# Patient Record
Sex: Male | Born: 1945 | Race: White | Hispanic: No | Smoking: Never smoker
Health system: Southern US, Community
[De-identification: ages and names within clinical notes are randomized; demographics above are authoritative.]

## PROBLEM LIST (undated history)

## (undated) DIAGNOSIS — C439 Malignant melanoma of skin, unspecified: Secondary | ICD-10-CM

## (undated) DIAGNOSIS — M75100 Unspecified rotator cuff tear or rupture of unspecified shoulder, not specified as traumatic: Secondary | ICD-10-CM

## (undated) DIAGNOSIS — I6529 Occlusion and stenosis of unspecified carotid artery: Secondary | ICD-10-CM

## (undated) DIAGNOSIS — I714 Abdominal aortic aneurysm, without rupture, unspecified: Secondary | ICD-10-CM

## (undated) DIAGNOSIS — R52 Pain, unspecified: Secondary | ICD-10-CM

## (undated) DIAGNOSIS — I10 Essential (primary) hypertension: Secondary | ICD-10-CM

## (undated) DIAGNOSIS — E119 Type 2 diabetes mellitus without complications: Secondary | ICD-10-CM

## (undated) DIAGNOSIS — I351 Nonrheumatic aortic (valve) insufficiency: Secondary | ICD-10-CM

## (undated) DIAGNOSIS — R432 Parageusia: Secondary | ICD-10-CM

## (undated) DIAGNOSIS — M2560 Stiffness of unspecified joint, not elsewhere classified: Secondary | ICD-10-CM

## (undated) DIAGNOSIS — R0602 Shortness of breath: Secondary | ICD-10-CM

## (undated) DIAGNOSIS — I219 Acute myocardial infarction, unspecified: Secondary | ICD-10-CM

## (undated) DIAGNOSIS — I499 Cardiac arrhythmia, unspecified: Secondary | ICD-10-CM

## (undated) DIAGNOSIS — G709 Myoneural disorder, unspecified: Secondary | ICD-10-CM

## (undated) DIAGNOSIS — M1990 Unspecified osteoarthritis, unspecified site: Secondary | ICD-10-CM

## (undated) DIAGNOSIS — Z9889 Other specified postprocedural states: Secondary | ICD-10-CM

## (undated) DIAGNOSIS — I2510 Atherosclerotic heart disease of native coronary artery without angina pectoris: Secondary | ICD-10-CM

## (undated) DIAGNOSIS — J449 Chronic obstructive pulmonary disease, unspecified: Secondary | ICD-10-CM

## (undated) DIAGNOSIS — D649 Anemia, unspecified: Secondary | ICD-10-CM

## (undated) DIAGNOSIS — F419 Anxiety disorder, unspecified: Secondary | ICD-10-CM

## (undated) DIAGNOSIS — C801 Malignant (primary) neoplasm, unspecified: Secondary | ICD-10-CM

## (undated) DIAGNOSIS — I509 Heart failure, unspecified: Secondary | ICD-10-CM

## (undated) DIAGNOSIS — F329 Major depressive disorder, single episode, unspecified: Secondary | ICD-10-CM

## (undated) DIAGNOSIS — Z9289 Personal history of other medical treatment: Secondary | ICD-10-CM

## (undated) DIAGNOSIS — F32A Depression, unspecified: Secondary | ICD-10-CM

## (undated) HISTORY — DX: Chronic obstructive pulmonary disease, unspecified: J44.9

## (undated) HISTORY — DX: Personal history of other medical treatment: Z92.89

## (undated) HISTORY — DX: Unspecified rotator cuff tear or rupture of unspecified shoulder, not specified as traumatic: M75.100

## (undated) HISTORY — DX: Heart failure, unspecified: I50.9

## (undated) HISTORY — DX: Malignant (primary) neoplasm, unspecified: C80.1

## (undated) HISTORY — DX: Parageusia: R43.2

## (undated) HISTORY — DX: Stiffness of unspecified joint, not elsewhere classified: M25.60

## (undated) HISTORY — DX: Atherosclerotic heart disease of native coronary artery without angina pectoris: I25.10

## (undated) HISTORY — PX: OTHER SURGICAL HISTORY: SHX169

## (undated) HISTORY — DX: Nonrheumatic aortic (valve) insufficiency: I35.1

## (undated) HISTORY — DX: Abdominal aortic aneurysm, without rupture, unspecified: I71.40

## (undated) HISTORY — PX: APPLICATION OF WOUND VAC: SHX5189

## (undated) HISTORY — DX: Acute myocardial infarction, unspecified: I21.9

## (undated) HISTORY — PX: MELANOMA EXCISION: SHX5266

## (undated) HISTORY — DX: Pain, unspecified: R52

## (undated) HISTORY — DX: Malignant melanoma of skin, unspecified: C43.9

## (undated) HISTORY — PX: ARTERIAL BYPASS SURGRY: SHX557

## (undated) HISTORY — DX: Abdominal aortic aneurysm, without rupture: I71.4

## (undated) HISTORY — PX: ABDOMINAL AORTIC ANEURYSM REPAIR: SUR1152

## (undated) HISTORY — DX: Occlusion and stenosis of unspecified carotid artery: I65.29

## (undated) HISTORY — DX: Essential (primary) hypertension: I10

## (undated) HISTORY — DX: Cardiac arrhythmia, unspecified: I49.9

## (undated) HISTORY — DX: Type 2 diabetes mellitus without complications: E11.9

## (undated) HISTORY — PX: PROSTATE BIOPSY: SHX241

---
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
## 1999-07-03 ENCOUNTER — Ambulatory Visit (HOSPITAL_COMMUNITY): Admission: RE | Admit: 1999-07-03 | Discharge: 1999-07-03 | Payer: Self-pay | Admitting: Internal Medicine

## 1999-07-03 ENCOUNTER — Encounter: Payer: Self-pay | Admitting: Internal Medicine

## 2005-10-26 ENCOUNTER — Emergency Department (HOSPITAL_COMMUNITY): Admission: EM | Admit: 2005-10-26 | Discharge: 2005-10-26 | Payer: Self-pay | Admitting: Emergency Medicine

## 2006-06-17 DIAGNOSIS — I509 Heart failure, unspecified: Secondary | ICD-10-CM

## 2006-06-17 DIAGNOSIS — I219 Acute myocardial infarction, unspecified: Secondary | ICD-10-CM

## 2006-06-17 HISTORY — DX: Acute myocardial infarction, unspecified: I21.9

## 2006-06-17 HISTORY — DX: Heart failure, unspecified: I50.9

## 2006-07-18 HISTORY — PX: CORONARY ARTERY BYPASS GRAFT: SHX141

## 2006-07-27 ENCOUNTER — Inpatient Hospital Stay (HOSPITAL_COMMUNITY): Admission: EM | Admit: 2006-07-27 | Discharge: 2006-08-07 | Payer: Self-pay | Admitting: Emergency Medicine

## 2006-07-28 ENCOUNTER — Ambulatory Visit: Payer: Self-pay | Admitting: Cardiothoracic Surgery

## 2006-07-28 ENCOUNTER — Ambulatory Visit: Payer: Self-pay | Admitting: Critical Care Medicine

## 2006-07-29 ENCOUNTER — Encounter (INDEPENDENT_AMBULATORY_CARE_PROVIDER_SITE_OTHER): Payer: Self-pay | Admitting: *Deleted

## 2006-07-29 ENCOUNTER — Encounter (INDEPENDENT_AMBULATORY_CARE_PROVIDER_SITE_OTHER): Payer: Self-pay | Admitting: Cardiology

## 2006-08-01 ENCOUNTER — Encounter: Payer: Self-pay | Admitting: Cardiothoracic Surgery

## 2006-08-22 ENCOUNTER — Ambulatory Visit: Payer: Self-pay | Admitting: Cardiothoracic Surgery

## 2006-08-22 ENCOUNTER — Encounter: Admission: RE | Admit: 2006-08-22 | Discharge: 2006-08-22 | Payer: Self-pay | Admitting: Cardiothoracic Surgery

## 2006-09-03 ENCOUNTER — Ambulatory Visit: Payer: Self-pay | Admitting: Critical Care Medicine

## 2006-09-12 ENCOUNTER — Ambulatory Visit: Payer: Self-pay | Admitting: Cardiothoracic Surgery

## 2006-09-22 ENCOUNTER — Ambulatory Visit: Payer: Self-pay | Admitting: Cardiothoracic Surgery

## 2006-12-11 ENCOUNTER — Encounter (HOSPITAL_COMMUNITY): Admission: RE | Admit: 2006-12-11 | Discharge: 2007-03-11 | Payer: Self-pay | Admitting: *Deleted

## 2007-03-06 ENCOUNTER — Ambulatory Visit: Payer: Self-pay | Admitting: Critical Care Medicine

## 2007-03-08 DIAGNOSIS — J8410 Pulmonary fibrosis, unspecified: Secondary | ICD-10-CM

## 2007-03-10 ENCOUNTER — Ambulatory Visit: Payer: Self-pay | Admitting: Cardiology

## 2007-03-12 ENCOUNTER — Encounter (HOSPITAL_COMMUNITY): Admission: RE | Admit: 2007-03-12 | Discharge: 2007-03-20 | Payer: Self-pay | Admitting: *Deleted

## 2007-03-27 ENCOUNTER — Ambulatory Visit: Payer: Self-pay | Admitting: Critical Care Medicine

## 2007-04-09 ENCOUNTER — Ambulatory Visit: Payer: Self-pay | Admitting: Critical Care Medicine

## 2007-09-16 ENCOUNTER — Encounter (INDEPENDENT_AMBULATORY_CARE_PROVIDER_SITE_OTHER): Payer: Self-pay | Admitting: Nurse Practitioner

## 2007-09-16 ENCOUNTER — Ambulatory Visit: Payer: Self-pay | Admitting: Family Medicine

## 2007-09-16 LAB — CONVERTED CEMR LAB
ALT: 16 units/L (ref 0–53)
Basophils Absolute: 0 10*3/uL (ref 0.0–0.1)
CO2: 25 meq/L (ref 19–32)
Calcium: 9.4 mg/dL (ref 8.4–10.5)
Chloride: 106 meq/L (ref 96–112)
Cholesterol: 133 mg/dL (ref 0–200)
Creatinine, Ser: 1.15 mg/dL (ref 0.40–1.50)
Hemoglobin: 15 g/dL (ref 13.0–17.0)
Lymphocytes Relative: 33 % (ref 12–46)
Microalb, Ur: 0.89 mg/dL (ref 0.00–1.89)
Monocytes Absolute: 0.6 10*3/uL (ref 0.1–1.0)
Neutro Abs: 3.8 10*3/uL (ref 1.7–7.7)
PSA: 2.61 ng/mL (ref 0.10–4.00)
RBC: 4.62 M/uL (ref 4.22–5.81)
RDW: 12.7 % (ref 11.5–15.5)
Total CHOL/HDL Ratio: 4.6
Total Protein: 8.1 g/dL (ref 6.0–8.3)
WBC: 6.9 10*3/uL (ref 4.0–10.5)

## 2007-09-18 ENCOUNTER — Ambulatory Visit: Payer: Self-pay | Admitting: *Deleted

## 2007-10-09 ENCOUNTER — Ambulatory Visit (HOSPITAL_COMMUNITY): Admission: RE | Admit: 2007-10-09 | Discharge: 2007-10-09 | Payer: Self-pay | Admitting: Family Medicine

## 2007-10-13 ENCOUNTER — Ambulatory Visit: Payer: Self-pay | Admitting: Internal Medicine

## 2008-03-30 ENCOUNTER — Encounter (INDEPENDENT_AMBULATORY_CARE_PROVIDER_SITE_OTHER): Payer: Self-pay | Admitting: Internal Medicine

## 2008-03-30 ENCOUNTER — Ambulatory Visit: Payer: Self-pay | Admitting: Internal Medicine

## 2008-03-30 LAB — CONVERTED CEMR LAB
ALT: 13 units/L (ref 0–53)
Alkaline Phosphatase: 46 units/L (ref 39–117)
LDL Cholesterol: 76 mg/dL (ref 0–99)
Sodium: 141 meq/L (ref 135–145)
Total Bilirubin: 0.8 mg/dL (ref 0.3–1.2)
Total Protein: 7.8 g/dL (ref 6.0–8.3)
VLDL: 25 mg/dL (ref 0–40)

## 2008-04-01 ENCOUNTER — Ambulatory Visit (HOSPITAL_COMMUNITY): Admission: RE | Admit: 2008-04-01 | Discharge: 2008-04-01 | Payer: Self-pay | Admitting: Internal Medicine

## 2008-07-22 ENCOUNTER — Ambulatory Visit: Payer: Self-pay | Admitting: Family Medicine

## 2008-07-22 LAB — CONVERTED CEMR LAB
HDL: 31 mg/dL — ABNORMAL LOW (ref >39)
Triglycerides: 123 mg/dL (ref <150)

## 2008-07-29 ENCOUNTER — Ambulatory Visit: Payer: Self-pay

## 2008-08-01 ENCOUNTER — Ambulatory Visit: Payer: Self-pay | Admitting: Cardiology

## 2008-08-16 ENCOUNTER — Encounter: Payer: Self-pay | Admitting: Cardiology

## 2008-08-16 ENCOUNTER — Ambulatory Visit: Payer: Self-pay

## 2008-08-16 ENCOUNTER — Ambulatory Visit: Payer: Self-pay | Admitting: Cardiology

## 2008-08-16 LAB — CONVERTED CEMR LAB
CO2: 30 meq/L (ref 19–32)
Calcium: 9 mg/dL (ref 8.4–10.5)
Creatinine, Ser: 1 mg/dL (ref 0.4–1.5)
Glucose, Bld: 113 mg/dL — ABNORMAL HIGH (ref 70–99)
Sodium: 141 meq/L (ref 135–145)

## 2008-08-23 ENCOUNTER — Encounter: Payer: Self-pay | Admitting: Cardiology

## 2008-08-23 ENCOUNTER — Ambulatory Visit: Payer: Self-pay | Admitting: Cardiology

## 2008-08-23 DIAGNOSIS — E785 Hyperlipidemia, unspecified: Secondary | ICD-10-CM | POA: Insufficient documentation

## 2008-08-23 DIAGNOSIS — I10 Essential (primary) hypertension: Secondary | ICD-10-CM | POA: Insufficient documentation

## 2008-08-23 DIAGNOSIS — R55 Syncope and collapse: Secondary | ICD-10-CM

## 2008-08-23 DIAGNOSIS — R0602 Shortness of breath: Secondary | ICD-10-CM | POA: Insufficient documentation

## 2008-08-23 DIAGNOSIS — I714 Abdominal aortic aneurysm, without rupture: Secondary | ICD-10-CM

## 2008-09-19 ENCOUNTER — Encounter (INDEPENDENT_AMBULATORY_CARE_PROVIDER_SITE_OTHER): Payer: Self-pay | Admitting: Internal Medicine

## 2008-09-19 ENCOUNTER — Ambulatory Visit: Payer: Self-pay | Admitting: Internal Medicine

## 2008-09-19 LAB — CONVERTED CEMR LAB
Albumin: 4.3 g/dL (ref 3.5–5.2)
Alkaline Phosphatase: 43 units/L (ref 39–117)
BUN: 19 mg/dL (ref 6–23)
CO2: 23 meq/L (ref 19–32)
Cholesterol: 116 mg/dL (ref 0–200)
Glucose, Bld: 95 mg/dL (ref 70–99)
HDL: 30 mg/dL — ABNORMAL LOW (ref >39)
LDL Cholesterol: 62 mg/dL (ref 0–99)
Potassium: 3.6 meq/L (ref 3.5–5.3)
Triglycerides: 120 mg/dL (ref <150)

## 2008-10-12 ENCOUNTER — Encounter (INDEPENDENT_AMBULATORY_CARE_PROVIDER_SITE_OTHER): Payer: Self-pay | Admitting: General Surgery

## 2008-10-12 ENCOUNTER — Ambulatory Visit: Payer: Self-pay | Admitting: Cardiology

## 2008-10-12 ENCOUNTER — Ambulatory Visit: Payer: Self-pay

## 2008-10-12 LAB — CONVERTED CEMR LAB
ALT: 14 units/L (ref 0–53)
Bilirubin, Direct: 0.1 mg/dL (ref 0.0–0.3)
LDL Cholesterol: 66 mg/dL (ref 0–99)
Total Bilirubin: 1 mg/dL (ref 0.3–1.2)
Total CHOL/HDL Ratio: 5
VLDL: 24.8 mg/dL (ref 0.0–40.0)

## 2008-10-14 ENCOUNTER — Telehealth: Payer: Self-pay | Admitting: Cardiology

## 2008-11-23 ENCOUNTER — Encounter: Payer: Self-pay | Admitting: Cardiology

## 2008-11-23 ENCOUNTER — Ambulatory Visit: Payer: Self-pay | Admitting: Internal Medicine

## 2008-12-22 ENCOUNTER — Ambulatory Visit: Payer: Self-pay | Admitting: Internal Medicine

## 2009-02-01 ENCOUNTER — Encounter (INDEPENDENT_AMBULATORY_CARE_PROVIDER_SITE_OTHER): Payer: Self-pay | Admitting: Internal Medicine

## 2009-02-01 ENCOUNTER — Ambulatory Visit: Payer: Self-pay | Admitting: Family Medicine

## 2009-02-01 LAB — CONVERTED CEMR LAB
Albumin: 4.2 g/dL (ref 3.5–5.2)
Alkaline Phosphatase: 38 units/L — ABNORMAL LOW (ref 39–117)
BUN: 20 mg/dL (ref 6–23)
CO2: 24 meq/L (ref 19–32)
Calcium: 8.8 mg/dL (ref 8.4–10.5)
Chloride: 107 meq/L (ref 96–112)
Cholesterol: 113 mg/dL (ref 0–200)
Creatinine, Ser: 1.12 mg/dL (ref 0.40–1.50)
Glucose, Bld: 116 mg/dL — ABNORMAL HIGH (ref 70–99)
HDL: 29 mg/dL — ABNORMAL LOW (ref >39)
LDL Cholesterol: 61 mg/dL (ref 0–99)
Potassium: 3.8 meq/L (ref 3.5–5.3)
Sodium: 142 meq/L (ref 135–145)
Total Protein: 7.5 g/dL (ref 6.0–8.3)
Triglycerides: 113 mg/dL (ref <150)

## 2009-02-16 ENCOUNTER — Ambulatory Visit: Payer: Self-pay | Admitting: Cardiology

## 2009-02-21 ENCOUNTER — Ambulatory Visit: Payer: Self-pay | Admitting: Family Medicine

## 2009-02-22 ENCOUNTER — Telehealth: Payer: Self-pay | Admitting: Cardiology

## 2009-03-23 ENCOUNTER — Ambulatory Visit: Payer: Self-pay | Admitting: Internal Medicine

## 2009-03-27 ENCOUNTER — Telehealth: Payer: Self-pay | Admitting: Cardiology

## 2009-03-30 ENCOUNTER — Ambulatory Visit: Payer: Self-pay | Admitting: Family Medicine

## 2009-05-18 ENCOUNTER — Ambulatory Visit: Payer: Self-pay | Admitting: Cardiology

## 2009-05-22 LAB — CONVERTED CEMR LAB
ALT: 26 units/L (ref 0–53)
AST: 24 units/L (ref 0–37)
Albumin: 3.8 g/dL (ref 3.5–5.2)
Alkaline Phosphatase: 35 units/L — ABNORMAL LOW (ref 39–117)
Cholesterol: 125 mg/dL (ref 0–200)
LDL Cholesterol: 80 mg/dL (ref 0–99)
VLDL: 18 mg/dL (ref 0.0–40.0)

## 2009-07-13 ENCOUNTER — Encounter (INDEPENDENT_AMBULATORY_CARE_PROVIDER_SITE_OTHER): Payer: Self-pay | Admitting: *Deleted

## 2009-07-17 ENCOUNTER — Ambulatory Visit: Payer: Self-pay | Admitting: Internal Medicine

## 2009-07-17 LAB — CONVERTED CEMR LAB: Microalb, Ur: 0.55 mg/dL (ref 0.00–1.89)

## 2009-07-24 ENCOUNTER — Ambulatory Visit: Payer: Self-pay | Admitting: Cardiology

## 2009-07-28 ENCOUNTER — Telehealth: Payer: Self-pay | Admitting: Cardiology

## 2009-07-28 LAB — CONVERTED CEMR LAB
ALT: 23 units/L (ref 0–53)
Albumin: 3.8 g/dL (ref 3.5–5.2)
Alkaline Phosphatase: 37 units/L — ABNORMAL LOW (ref 39–117)
Bilirubin, Direct: 0 mg/dL (ref 0.0–0.3)
Cholesterol: 105 mg/dL (ref 0–200)
LDL Cholesterol: 55 mg/dL (ref 0–99)
Total Protein: 7.9 g/dL (ref 6.0–8.3)
Triglycerides: 90 mg/dL (ref 0.0–149.0)
VLDL: 18 mg/dL (ref 0.0–40.0)

## 2009-08-24 ENCOUNTER — Ambulatory Visit: Payer: Self-pay | Admitting: Cardiology

## 2009-08-24 DIAGNOSIS — I739 Peripheral vascular disease, unspecified: Secondary | ICD-10-CM

## 2009-09-11 ENCOUNTER — Ambulatory Visit: Payer: Self-pay

## 2009-09-11 ENCOUNTER — Ambulatory Visit: Payer: Self-pay | Admitting: Internal Medicine

## 2009-09-11 ENCOUNTER — Encounter: Payer: Self-pay | Admitting: Cardiology

## 2009-09-11 ENCOUNTER — Ambulatory Visit (HOSPITAL_COMMUNITY): Admission: RE | Admit: 2009-09-11 | Discharge: 2009-09-11 | Payer: Self-pay | Admitting: Cardiology

## 2009-09-20 ENCOUNTER — Ambulatory Visit: Payer: Self-pay | Admitting: Internal Medicine

## 2009-11-07 ENCOUNTER — Ambulatory Visit: Payer: Self-pay | Admitting: Internal Medicine

## 2010-01-17 ENCOUNTER — Telehealth: Payer: Self-pay | Admitting: Cardiology

## 2010-01-22 ENCOUNTER — Telehealth: Payer: Self-pay | Admitting: Cardiology

## 2010-03-20 ENCOUNTER — Encounter: Payer: Self-pay | Admitting: Cardiology

## 2010-04-16 ENCOUNTER — Ambulatory Visit: Payer: Self-pay | Admitting: Cardiology

## 2010-04-16 ENCOUNTER — Encounter: Payer: Self-pay | Admitting: Cardiology

## 2010-04-17 LAB — CONVERTED CEMR LAB
AST: 20 units/L (ref 0–37)
Alkaline Phosphatase: 38 units/L — ABNORMAL LOW (ref 39–117)
Bilirubin, Direct: 0.1 mg/dL (ref 0.0–0.3)
Calcium: 8.9 mg/dL (ref 8.4–10.5)
GFR calc non Af Amer: 78.79 mL/min (ref >60)
HDL: 26.5 mg/dL — ABNORMAL LOW (ref >39.00)
LDL Cholesterol: 55 mg/dL (ref 0–99)
Potassium: 3.9 meq/L (ref 3.5–5.1)
Sodium: 141 meq/L (ref 135–145)
Total Bilirubin: 0.5 mg/dL (ref 0.3–1.2)
Total CHOL/HDL Ratio: 4
VLDL: 30.6 mg/dL (ref 0.0–40.0)

## 2010-04-25 ENCOUNTER — Telehealth (INDEPENDENT_AMBULATORY_CARE_PROVIDER_SITE_OTHER): Payer: Self-pay | Admitting: *Deleted

## 2010-04-26 ENCOUNTER — Telehealth: Payer: Self-pay | Admitting: Cardiology

## 2010-05-01 ENCOUNTER — Telehealth: Payer: Self-pay | Admitting: Cardiology

## 2010-07-08 ENCOUNTER — Encounter: Payer: Self-pay | Admitting: Cardiothoracic Surgery

## 2010-07-17 NOTE — Progress Notes (Signed)
Summary: WOULD LIKE RESULTS FROM LAB WORK DONE MONDAY  Phone Note Call from Patient Call back at Home Phone [phone]   Reason for Call: Talk to Nurse, Talk to Doctor, Lab or Test Results Summary of Call: PT HAD LAB WORK DONE MONDAY AND HE WOULD LIKE THE RESULTS Initial call taken by: Omer Jack,  July 28, 2009 12:39 PM  Follow-up for Phone Call        PT AWARE OF LAB RESULTS. Follow-up by: Scherrie Bateman, LPN,  July 28, 2009 2:34 PM

## 2010-07-17 NOTE — Progress Notes (Signed)
  Walk in Patient Form Recieved " Pt left Info about Prescription" sent to Message Nurse"  Mitchell [name]  April 25, 2010 8:58 AM

## 2010-07-17 NOTE — Letter (Signed)
Summary: Appointment - Reminder 2  [facility], Main Office  [address]   [place], [place] [address]   Phone: [phone]  Fax: [phone]     July 13, 2009 MRN: [id number]   Mitchell [name] [address] [place], [place]  [address]   Dear Mr. SHON,  Our records indicate that it is time to schedule a follow-up appointment with Dr. Shirlee Latch. It is very important that we reach you to schedule this appointment. We look forward to participating in your health care needs. Please contact us at the number listed above at your earliest convenience to schedule your appointment.  If you are unable to make an appointment at this time, give [place] a call so we can update our records.  Sincerely,   Migdalia Dk [facility] Scheduling Team

## 2010-07-17 NOTE — Progress Notes (Signed)
Summary: refill request  Phone Note Refill Request Message from:  Patient on January 17, 2010 2:41 PM  Refills Requested: Medication #1:  PLAVIX 75 MG TABS Take one tablet by mouth daily  Medication #2:  TEKTURNA 150 MG  TABS one by mouth once daily [facility]   Method Requested: Telephone to Pharmacy Initial call taken by: Glynda Jaeger,  January 17, 2010 2:42 PM  Follow-up for Phone Call       Follow-up by: Judithe Modest CMA,  January 17, 2010 3:54 PM    Prescriptions: PLAVIX 75 MG TABS (CLOPIDOGREL BISULFATE) Take one tablet by mouth daily  #30 x 6   Entered by:   Judithe Modest CMA   Authorized by:   Marca Ancona, MD   Signed by:   Judithe Modest CMA on 01/17/2010   Method used:   Faxed to ...       [facility] (retail)       [address] [place], [place]  [address]       Ph: [phone]       Fax: [phone]   RxID:   [id number] TEKTURNA 150 MG  TABS (ALISKIREN FUMARATE) one by mouth once daily  #30 x 6   Entered by:   Judithe Modest CMA   Authorized by:   Marca Ancona, MD   Signed by:   Judithe Modest CMA on 01/17/2010   Method used:   Faxed to ...       [facility] (retail)       [address] [place], [place]  [address]       Ph: [phone]       Fax: [phone]   RxID:   [id number]

## 2010-07-17 NOTE — Assessment & Plan Note (Signed)
Summary: 6 month rov/sl   Primary Provider:  Dow Adolph  CC:  6 month ROV.  History of Present Illness: 65 yo with h/o CAD s/p CABG, aortic aneurysm, HTN, DM2 returns to cardiology clinic.  Pt has been doing well.  BP is 136/74  today.  He walks daily for about 2 miles.  He is a little short of breath by the time he reaches the end and he is short of breath with inclines.  No chest pain.  He has some very mild myalgias that may be related to his statin but he says that they are tolerable.  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  He is not planning on having surgery.   ECG:  NSR, PACs  Labs (8/10): K 3.8, creatinine 1.12, LDL 61, HDL 29 Labs (3/11): HDL 32, LDL 55, LFTs normal  Current Medications (verified): 1)  Coreg 12.5 Mg Tabs (Carvedilol) .... Take 1 1/2 By Mouth Two Times A Day 2)  Aspirin 81 Mg Tbec (Aspirin) .... Take One Tablet By Mouth Daily 3)  Glipizide Xl 5 Mg  Tb24 (Glipizide) .... 1/2 Tab Two Times A Day 4)  Doxazosin Mesylate 4 Mg  Tabs (Doxazosin Mesylate) .... One By Mouth Once Daily 5)  Tekturna 150 Mg  Tabs (Aliskiren Fumarate) .... One By Mouth Once Daily 6)  Amlodipine Besylate 10 Mg Tabs (Amlodipine Besylate) .... Take One Tablet By Mouth Daily 7)  K-Lor 20 Meq Pack (Potassium Chloride) .... Take 1 Once Daily 8)  Crestor 20 Mg Tabs (Rosuvastatin Calcium) .... One Tablet Daily 9)  Hydrochlorothiazide 25 Mg Tabs (Hydrochlorothiazide) .... Take One Tablet By Mouth Daily. 10)  Benazepril Hcl 40 Mg Tabs (Benazepril Hcl) .... Take 1 Once Daily 11)  Trazodone Hcl 100 Mg Tabs (Trazodone Hcl) .... Take 1 1/2 At Bedtime 12)  Plavix 75 Mg Tabs (Clopidogrel Bisulfate) .... Take One Tablet By Mouth Daily 13)  Spiriva Handihaler 18 Mcg  Caps (Tiotropium Bromide Monohydrate) .... Contents of One Capsule Inhaled Daily 14)  Multivitamins   Tabs (Multiple Vitamin) .... Once Daily 15)  Fish Oil 1000 Mg Caps (Omega-3 Fatty Acids) .... Take 1  Tablet By Mouth Two Times A Day 16)  Coenzyme Q10 100 Mg Caps (Coenzyme Q10) .Marland Kitchen.. 1 Daily 17)  Salsalate 500 Mg Tabs (Salsalate) .... Take One Tablet Three Times A Day 18)  Viagra 50 Mg Tabs (Sildenafil Citrate) .... Take As Needed  Allergies (verified): No Known Drug Allergies  Past History:  Past Medical History: 1. Coronary artery disease.  The patient had non-ST-elevation MI in February 2008 and there was an 80-90% proximal LAD stenosis, 90%proximal first diagonal stenosis, 99% ostial ramus stenosis.  The first obtuse marginal was subtotally occluded and there was an 80% mid RCA stenosis.  EF was estimated 25% on ventriculogram, coronary artery bypass grafting was done with a LIMA to the LAD, sequential saphenous vein graft to the PDA, and PLOM, saphenous graft to the diagonal, saphenous vein graft to the ramus. 2. Most recent echo (3/10): EF 55%, mild inferoseptal HK, mild LVH, pseudonormal diastolic function, mild AI, mild MR, mild to moderate LAE, PASP 36, mild to moderate TR.  3. Infrarenal AAA.  The last ultrasound was done in 4/10 showed AAA 3.7 cm.  4. Hypertension. 5. Type 2 diabetes. 6. History of torn left rotator cuff. 7. History of obstructive/restrictive PFTs.  There is some reversibility with bronchodilator.  The patient actually never smoked.  He is on Spiriva. 8. Holter (3/10):  Frequent PACs and blocked PACs.  PVCs with trigeminy.  No long pauses.  Slowest HR in 40s while asleep.  8. Carotid stenosis.  The patient had mild bilateral internal carotid stenosis of 40-59% on last carotid Dopplers in February 2010.   Family History: Reviewed history from 02/16/2009 and no changes required. Both the patient's mother and father had abdominal aortic aneurysm and both died due to complications with attempted AAA repair.  Social History: Reviewed history from 02/16/2009 and no changes required. The patient is a lifetime nonsmoker. He is unemployed but draws a pension from the  [employer].  He lives by himself.  He has no close family left, he only has cousins.  He plays the piano in church.   Review of Systems       All systems reviewed and negative except as per HPI.   Vital Signs:  Patient profile:   65 year old male Height:      72 inches Weight:      216 pounds BMI:     29.40 Pulse rate:   57 / minute Pulse rhythm:   regular BP sitting:   136 / 74  (left arm) Cuff size:   regular  Vitals Entered By: Judithe Modest CMA (August 24, 2009 3:20 PM)  Physical Exam  General:  Well developed, well nourished, in no acute distress. Neck:  Neck supple, no JVD. No masses, thyromegaly or abnormal cervical nodes. Lungs:  Clear bilaterally to auscultation. Heart:  Non-displaced PMI, chest non-tender; regular rate and rhythm, S1, S2 without murmurs, rubs. +S4. Carotid upstroke normal, no bruit. Pedals normal pulses. No edema, no varicosities. Abdomen:  Bowel sounds positive; abdomen soft and non-tender without masses, organomegaly, or hernias noted. No hepatosplenomegaly. Extremities:  No clubbing or cyanosis. Neurologic:  Alert and oriented x 3. Psych:  Normal affect.   Impression & Recommendations:  Problem # 1:  CORONARY ARTERY DISEASE (ICD-414.00) Stable.  No chest pain.  Continue current meds including ASA, statin, ACEI, Coreg, Plavix.  Will get an echocardiogram to reassess LV systolic function (he says he was told that he has CHF by the [employer] and does not understand why, he does not think they did an echo).    Problem # 2:  AAA (ICD-441.4) Patient is due for abdominal US to follow AAA.   Problem # 3:  CAROTID ARTERY STENOSIS (ICD-433.10) Patient is due to carotid dopplers to follow carotid stenosis.    Problem # 4:  HYPERLIPIDEMIA-MIXED (ICD-272.4) Excellent LDL.  HDL is low.  Patient had bad headaches with Niaspan in the past and is not able to tolerate it.  Very mild myalgias on Crestor.  Tolerable per patient, will continue current dose.    Other  Orders: EKG w/ Interpretation (93000) Carotid Duplex (Carotid Duplex) Echocardiogram (Echo) Abdominal Aorta Duplex (Abd Aorta Duplex)  Patient Instructions: 1)  Your physician recommends that you schedule a follow-up appointment in: 6 months with Dr. Shirlee Latch. 2)  Your physician has requested that you have an abdominal aorta duplex. During this test, an ultrasound is used to evaluate the aorta. Allow 30 minutes for this exam. Do not eat after midnight the day before and avoid carbonated beverages. There are no restrictions or special instructions. 3)  Your physician has requested that you have a carotid duplex. This test is an ultrasound of the carotid arteries in your neck. It looks at blood flow through these arteries that supply the brain with blood. Allow  one hour for this exam. There are no restrictions or special instructions. 4)  Your physician has requested that you have an echocardiogram.  Echocardiography is a painless test that uses sound waves to create images of your heart. It provides your doctor with information about the size and shape of your heart and how well your heart's chambers and valves are working.  This procedure takes approximately one hour. There are no restrictions for this procedure. 5)  Your physician recommends that you continue on your current medications as directed. Please refer to the Current Medication list given to you today. 6)  Also, you will need to have a FASTING lipid and liver blood test prior to your appointment in 6 months with Dr. Shirlee Latch.

## 2010-07-17 NOTE — Progress Notes (Signed)
Summary: re benicar   Phone Note Call from Patient Call back at Home Phone [phone]   Caller: Patient Reason for Call: Talk to Nurse Summary of Call: pt is not taking benicar this month. pt will start meds next month because pharmacy fill his old rx tekurna. Initial call taken by: Roe Coombs,  May 01, 2010 3:15 PM  Follow-up for Phone Call        pt states that he was able to get Tekturna and he did not have to change to Benicar--he will call me if he does have to make the change from [place] to [employer] due insurance company request--if he does have to change to Benicar he will need BMP 2 weeks after changing to Benicar and followup on B/P after changing medication    New/Updated Medications: TEKTURNA 150 MG TABS (ALISKIREN FUMARATE) one daily   Current Medications (verified): 1)  Coreg 12.5 Mg Tabs (Carvedilol) .... Take 1 1/2 By Mouth Two Times A Day 2)  Aspirin 81 Mg Tbec (Aspirin) .... Take One Tablet By Mouth Daily 3)  Glipizide Xl 5 Mg  Tb24 (Glipizide) .... 1/2 Tab Two Times A Day 4)  Doxazosin Mesylate 4 Mg  Tabs (Doxazosin Mesylate) .... One By Mouth Once Daily 5)  Tekturna 150 Mg Tabs (Aliskiren Fumarate) .... One Daily 6)  Amlodipine Besylate 10 Mg Tabs (Amlodipine Besylate) .... Take One Tablet By Mouth Daily 7)  K-Lor 20 Meq Pack (Potassium Chloride) .... Take 1 Once Daily 8)  Crestor 20 Mg Tabs (Rosuvastatin Calcium) .... One Tablet Daily 9)  Hydrochlorothiazide 25 Mg Tabs (Hydrochlorothiazide) .... Take One Tablet By Mouth Daily. 10)  Benazepril Hcl 40 Mg Tabs (Benazepril Hcl) .... Take 1 Once Daily 11)  Trazodone Hcl 100 Mg Tabs (Trazodone Hcl) .... Take 1 1/2 At Bedtime 12)  Plavix 75 Mg Tabs (Clopidogrel Bisulfate) .... Take One Tablet By Mouth Daily 13)  Spiriva Handihaler 18 Mcg  Caps (Tiotropium Bromide Monohydrate) .... Contents of One Capsule Inhaled Daily 14)  Multivitamins   Tabs (Multiple Vitamin) .... Once Daily 15)  Fish Oil 1000 Mg Caps  (Omega-3 Fatty Acids) .... Take 1 Tablet By Mouth Two Times A Day 16)  Coenzyme Q10 100 Mg Caps (Coenzyme Q10) .Marland Kitchen.. 1 Daily 17)  Salsalate 500 Mg Tabs (Salsalate) .... Take One Tablet Three Times A Day 18)  Viagra 50 Mg Tabs (Sildenafil Citrate) .... Take As Needed  Allergies: No Known Drug Allergies

## 2010-07-17 NOTE — Progress Notes (Signed)
Summary: refill wrong pharmacy  Phone Note Refill Request Message from:  Patient on January 22, 2010 2:19 PM  Refills Requested: Medication #1:  TEKTURNA 150 MG  TABS one by mouth once daily  Medication #2:  PLAVIX 75 MG TABS Take one tablet by mouth daily refills were suppose to go to [facility] we resend?   Follow-up for Phone Call       Follow-up by: Judithe Modest CMA,  January 22, 2010 2:45 PM    Prescriptions: PLAVIX 75 MG TABS (CLOPIDOGREL BISULFATE) Take one tablet by mouth daily  #30 x 6   Entered by:   Judithe Modest CMA   Authorized by:   Marca Ancona, MD   Signed by:   Judithe Modest CMA on 01/22/2010   Method used:   Electronically to        [facility] (retail)       [address]       [place], [place]  [address]       Ph: [phone]       Fax: [phone]   RxID:   [id number] TEKTURNA 150 MG  TABS (ALISKIREN FUMARATE) one by mouth once daily  #30 x 6   Entered by:   Judithe Modest CMA   Authorized by:   Marca Ancona, MD   Signed by:   Judithe Modest CMA on 01/22/2010   Method used:   Electronically to        [facility] (retail)       [address]       [place], [place]  [address]       Ph: [phone]       Fax: [phone]   RxID:   [id number]

## 2010-07-17 NOTE — Progress Notes (Signed)
Summary: insurance requesting change Tekturna  ---- Converted from flag ---- ---- 04/24/2010 4:13 PM, Judithe Modest CMA wrote: Elmon Else, this pt insurance requesting a change from Tekturna to either Losartan, Benicar or Diovan. Would Dr. Shirlee Latch authorize changing this?  Marchelle Folks ------------------------------  Appended Document: insurance requesting change Tekturna olmesartan 10 mg daily to replace Tekturna.  Check BMET in 2 wks, call to make sure BP is ok in a couple of weeks as well.   Appended Document: insurance requesting change Tekturna discussed with pt--he will return for BMP 05/11/10 --he will take and record his B/P --I will followup with him in about 2 weeks to get his B/P readings   Clinical Lists Changes  Medications: Changed medication from TEKTURNA 150 MG  TABS (ALISKIREN FUMARATE) one by mouth once daily to BENICAR 20 MG TABS (OLMESARTAN MEDOXOMIL) one-half daily - Signed Rx of BENICAR 20 MG TABS (OLMESARTAN MEDOXOMIL) one-half daily;  #15 x 11;  Signed;  Entered by: Katina Dung, RN, BSN;  Authorized by: Marca Ancona, MD;  Method used: Electronically to [facility] [address], [place], [place], [place]  [address], Ph: [phone], Fax: [phone] Observations: Added new observation of MEDRECON: current updated (04/26/2010 11:29)    Prescriptions: BENICAR 20 MG TABS (OLMESARTAN MEDOXOMIL) one-half daily  #15 x 11   Entered by:   Katina Dung, RN, BSN   Authorized by:   Marca Ancona, MD   Signed by:   Katina Dung, RN, BSN on 04/26/2010   Method used:   Electronically to        [facility] (retail)       [address]       [place], [place]  [address]       Ph: [phone]       Fax: [phone]   RxID:   [id number]     Current Medications (verified): 1)  Coreg 12.5 Mg Tabs (Carvedilol) .... Take 1 1/2 By Mouth Two Times A Day 2)  Aspirin 81 Mg Tbec (Aspirin) .... Take One Tablet By Mouth  Daily 3)  Glipizide Xl 5 Mg  Tb24 (Glipizide) .... 1/2 Tab Two Times A Day 4)  Doxazosin Mesylate 4 Mg  Tabs (Doxazosin Mesylate) .... One By Mouth Once Daily 5)  Benicar 20 Mg Tabs (Olmesartan Medoxomil) .... One-Half Daily 6)  Amlodipine Besylate 10 Mg Tabs (Amlodipine Besylate) .... Take One Tablet By Mouth Daily 7)  K-Lor 20 Meq Pack (Potassium Chloride) .... Take 1 Once Daily 8)  Crestor 20 Mg Tabs (Rosuvastatin Calcium) .... One Tablet Daily 9)  Hydrochlorothiazide 25 Mg Tabs (Hydrochlorothiazide) .... Take One Tablet By Mouth Daily. 10)  Benazepril Hcl 40 Mg Tabs (Benazepril Hcl) .... Take 1 Once Daily 11)  Trazodone Hcl 100 Mg Tabs (Trazodone Hcl) .... Take 1 1/2 At Bedtime 12)  Plavix 75 Mg Tabs (Clopidogrel Bisulfate) .... Take One Tablet By Mouth Daily 13)  Spiriva Handihaler 18 Mcg  Caps (Tiotropium Bromide Monohydrate) .... Contents of One Capsule Inhaled Daily 14)  Multivitamins   Tabs (Multiple Vitamin) .... Once Daily 15)  Fish Oil 1000 Mg Caps (Omega-3 Fatty Acids) .... Take 1 Tablet By Mouth Two Times A Day 16)  Coenzyme Q10 100 Mg Caps (Coenzyme Q10) .Marland Kitchen.. 1 Daily 17)  Salsalate 500 Mg Tabs (Salsalate) .... Take One Tablet Three Times A Day 18)  Viagra 50 Mg Tabs (Sildenafil Citrate) .... Take As Needed  Allergies: No Known  Drug Allergies

## 2010-07-17 NOTE — Assessment & Plan Note (Signed)
Summary: 6 month ck/mt   History of Present Illness: 64 yo with h/o CAD s/p CABG, aortic aneurysm, HTN, DM2 returns to cardiology clinic.  Pt has been doing well.  BP is 108/80  today and has been under good control at home.  He walks daily for at least a mile.  He is a little short of breath by the time he reaches the end and he is short of breath with inclines.  No chest pain.  Echo in 3/11 showed moderate aortic insufficiency and carotids showed moderate bilateral disease.   ECG:  NSR, PACs at 54  Labs (8/10): K 3.8, creatinine 1.12, LDL 61, HDL 29 Labs (3/11): HDL 32, LDL 55, LFTs normal  Current Medications (verified): 1)  Coreg 12.5 Mg Tabs (Carvedilol) .... Take 1 1/2 By Mouth Two Times A Day 2)  Aspirin 81 Mg Tbec (Aspirin) .... Take One Tablet By Mouth Daily 3)  Glipizide Xl 5 Mg  Tb24 (Glipizide) .... 1/2 Tab Two Times A Day 4)  Doxazosin Mesylate 4 Mg  Tabs (Doxazosin Mesylate) .... One By Mouth Once Daily 5)  Tekturna 150 Mg  Tabs (Aliskiren Fumarate) .... One By Mouth Once Daily 6)  Amlodipine Besylate 10 Mg Tabs (Amlodipine Besylate) .... Take One Tablet By Mouth Daily 7)  K-Lor 20 Meq Pack (Potassium Chloride) .... Take 1 Once Daily 8)  Crestor 20 Mg Tabs (Rosuvastatin Calcium) .... One Tablet Daily 9)  Hydrochlorothiazide 25 Mg Tabs (Hydrochlorothiazide) .... Take One Tablet By Mouth Daily. 10)  Benazepril Hcl 40 Mg Tabs (Benazepril Hcl) .... Take 1 Once Daily 11)  Trazodone Hcl 100 Mg Tabs (Trazodone Hcl) .... Take 1 1/2 At Bedtime 12)  Plavix 75 Mg Tabs (Clopidogrel Bisulfate) .... Take One Tablet By Mouth Daily 13)  Spiriva Handihaler 18 Mcg  Caps (Tiotropium Bromide Monohydrate) .... Contents of One Capsule Inhaled Daily 14)  Multivitamins   Tabs (Multiple Vitamin) .... Once Daily 15)  Fish Oil 1000 Mg Caps (Omega-3 Fatty Acids) .... Take 1 Tablet By Mouth Two Times A Day 16)  Coenzyme Q10 100 Mg Caps (Coenzyme Q10) .Marland Kitchen.. 1 Daily 17)  Salsalate 500 Mg Tabs (Salsalate)  .... Take One Tablet Three Times A Day 18)  Viagra 50 Mg Tabs (Sildenafil Citrate) .... Take As Needed  Allergies (verified): No Known Drug Allergies  Past History:  Past Medical History: 1. Coronary artery disease.  The patient had non-ST-elevation MI in February 2008 and there was an 80-90% proximal LAD stenosis, 90%proximal first diagonal stenosis, 99% ostial ramus stenosis.  The first obtuse marginal was subtotally occluded and there was an 80% mid RCA stenosis.  EF was estimated 25% on ventriculogram, coronary artery bypass grafting was done with a LIMA to the LAD, sequential saphenous vein graft to the PDA, and PLOM, saphenous graft to the diagonal, saphenous vein graft to the ramus. 2. Aortic insufficiency: Most recent echo (3/11): EF 55-60%, moderate diastolic dysfunction, moderate aortic insufficiency, moderate LAE.  3. Infrarenal AAA.  The last ultrasound was done in 3/11 and showed AAA 3.9 x 3.9 cm. 4. Hypertension. 5. Type 2 diabetes. 6. History of torn left rotator cuff. 7. History of obstructive/restrictive PFTs.  There is some reversibility with bronchodilator.  The patient actually never smoked.  He is on Spiriva. 8. Holter (3/10):  Frequent PACs and blocked PACs.  PVCs with trigeminy.  No long pauses.  Slowest HR in 40s while asleep.  8. Carotid stenosis.  The patient had mild-moderate bilateral internal carotid stenosis of  40-59% on last carotid Dopplers in 3/11.   Family History: Reviewed history from 02/16/2009 and no changes required. Both the patient's mother and father had abdominal aortic aneurysm and both died due to complications with attempted AAA repair.  Social History: Reviewed history from 08/24/2009 and no changes required. The patient is a lifetime nonsmoker. He is unemployed but draws a pension from the [place].  He lives by himself.  He has no close family left, he only has cousins.  He plays the piano in church.   Review of Systems       All systems  reviewed and negative except as per HPI.   Vital Signs:  Patient profile:   65 year old male Height:      72 inches Weight:      215 pounds BMI:     29.26 Pulse rate:   54 / minute Resp:     16 per minute BP sitting:   108 / 80  (right arm)  Vitals Entered By: Marrion Coy, CNA (April 16, 2010 9:24 AM)  Physical Exam  General:  Well developed, well nourished, in no acute distress. Neck:  Neck supple, no JVD. No masses, thyromegaly or abnormal cervical nodes. Lungs:  Clear bilaterally to auscultation. Heart:  Non-displaced PMI, chest non-tender; regular rate and rhythm, S1, S2 without murmurs, rubs. +S4. Carotid upstroke normal, no bruit. Pedals normal pulses. No edema, no varicosities. Abdomen:  Bowel sounds positive; abdomen soft and non-tender without masses, organomegaly, or hernias noted. No hepatosplenomegaly. Extremities:  No clubbing or cyanosis. Neurologic:  Alert and oriented x 3. Psych:  Normal affect.   Impression & Recommendations:  Problem # 1:  CORONARY ATHEROSLERO UNSPEC TYPE BYPASS GRAFT (ICD-414.05) Stable with no ischemic symptoms. Continue Coreg, ASA, statin, ACEI, Plavix.   Problem # 2:  AORTIC INSUFFICIENCY (ICD-424.1) Moderate AI on 3/11 echo.  Control BP and repeat echo in 3/12.   Problem # 3:  AAA (ICD-441.4) Repeat abdominal US in 3/12.   Problem # 4:  HYPERTENSION, UNSPECIFIED (ICD-401.9) BP is under good control on current regimen, continue.   Problem # 5:  HYPERLIPIDEMIA-MIXED (ICD-272.4) Goal LDL < 70, lipids/LFTs today.   Problem # 6:  CAROTID ARTERY STENOSIS (ICD-433.10) Repeat carotid dopplers 3/11.   Other Orders: Carotid Duplex (Carotid Duplex) Echocardiogram (Echo) Abdominal Aorta Duplex (Abd Aorta Duplex) TLB-BMP (Basic Metabolic Panel-BMET) (80048-METABOL)  Patient Instructions: 1)  Your physician recommends that you return for a FASTING lipid profile/liver profile/BMP today--414.05 424.1 2)  Your physician has requested  that you have an abdominal aorta duplex. During this test, an ultrasound is used to evaluate the aorta. Allow 30 minutes for this exam. Do not eat after midnight the day before and avoid carbonated beverages. There are no restrictions or special instructions. MARCH 2012 3)  Your physician has requested that you have a carotid duplex. This test is an ultrasound of the carotid arteries in your neck. It looks at blood flow through these arteries that supply the brain with blood. Allow one hour for this exam. There are no restrictions or special instructions. MARCH 2012 4)  Your physician has requested that you have an echocardiogram.  Echocardiography is a painless test that uses sound waves to create images of your heart. It provides your doctor with information about the size and shape of your heart and how well your heart's chambers and valves are working.  This procedure takes approximately one hour. There are no restrictions for this procedure. MARCH 2012 5)  Your physician wants you to follow-up in: 6 months with Dr Shirlee Latch.  You will receive a reminder letter in the mail two months in advance. If you don't receive a letter, please call our office to schedule the follow-up appointment.

## 2010-07-19 NOTE — Letter (Signed)
Summary: [employer] Medicare Rx Plans Prescription Drug Coverage Notice   [facility] Rx Plans Prescription Drug Coverage Notice   Imported By: Roderic Ovens 05/22/2010 11:55:37  _____________________________________________________________________  External Attachment:    Type:   Image     Comment:   External Document

## 2010-07-27 ENCOUNTER — Telehealth: Payer: Self-pay | Admitting: Cardiology

## 2010-08-08 NOTE — Progress Notes (Signed)
Summary: questions about having labs drawn  Phone Note Call from Patient Call back at Home Phone [phone]   Caller: Patient Summary of Call: questions about  having labs drawn Initial call taken by: Judie Grieve,  July 27, 2010 3:23 PM  Follow-up for Phone Call        Pt called today and scheduled appt for 6 month follow up with Dr. Shirlee Latch on September 11, 2010. He is asking if he needs lab work prior to this office visit.  I told pt I would forward to Dr. Alford Highland nurse Thurston Hole to review and she would call him back next week. Follow-up by: Dossie Arbour, RN, BSN,  July 27, 2010 3:37 PM     Appended Document: questions about having labs drawn Needs lipids/LFTs in 4/12.   Appended Document: questions about having labs drawn I talked with pt--scheduled for fasting L/L 09/18/10 prior to appt with Dr Shirlee Latch 09/24/10

## 2010-08-24 ENCOUNTER — Encounter: Payer: Self-pay | Admitting: Cardiology

## 2010-08-27 ENCOUNTER — Encounter: Payer: Self-pay | Admitting: Cardiology

## 2010-08-27 ENCOUNTER — Encounter (INDEPENDENT_AMBULATORY_CARE_PROVIDER_SITE_OTHER): Payer: Medicare Other

## 2010-08-27 DIAGNOSIS — I714 Abdominal aortic aneurysm, without rupture: Secondary | ICD-10-CM

## 2010-08-27 DIAGNOSIS — R0989 Other specified symptoms and signs involving the circulatory and respiratory systems: Secondary | ICD-10-CM

## 2010-08-27 DIAGNOSIS — I6529 Occlusion and stenosis of unspecified carotid artery: Secondary | ICD-10-CM

## 2010-08-28 NOTE — Miscellaneous (Signed)
Summary: Orders Update  Clinical Lists Changes  Orders: Added new Test order of Carotid Duplex (Carotid Duplex) - Signed Added new Test order of Abdominal Aorta Duplex (Abd Aorta Duplex) - Signed

## 2010-09-04 ENCOUNTER — Other Ambulatory Visit (HOSPITAL_COMMUNITY): Payer: Self-pay

## 2010-09-11 ENCOUNTER — Ambulatory Visit: Payer: Self-pay | Admitting: Cardiology

## 2010-09-11 ENCOUNTER — Encounter: Payer: Self-pay | Admitting: Cardiology

## 2010-09-12 ENCOUNTER — Ambulatory Visit: Payer: Self-pay | Admitting: Cardiology

## 2010-09-16 DIAGNOSIS — C439 Malignant melanoma of skin, unspecified: Secondary | ICD-10-CM

## 2010-09-16 DIAGNOSIS — Z85820 Personal history of malignant melanoma of skin: Secondary | ICD-10-CM | POA: Insufficient documentation

## 2010-09-16 HISTORY — DX: Malignant melanoma of skin, unspecified: C43.9

## 2010-09-17 ENCOUNTER — Other Ambulatory Visit (HOSPITAL_COMMUNITY): Payer: Self-pay | Admitting: Cardiology

## 2010-09-17 DIAGNOSIS — I351 Nonrheumatic aortic (valve) insufficiency: Secondary | ICD-10-CM

## 2010-09-18 ENCOUNTER — Other Ambulatory Visit (INDEPENDENT_AMBULATORY_CARE_PROVIDER_SITE_OTHER): Payer: Medicare Other | Admitting: *Deleted

## 2010-09-18 ENCOUNTER — Ambulatory Visit (HOSPITAL_COMMUNITY): Payer: Medicare Other | Attending: Cardiology | Admitting: Radiology

## 2010-09-18 ENCOUNTER — Other Ambulatory Visit: Payer: Self-pay

## 2010-09-18 DIAGNOSIS — I351 Nonrheumatic aortic (valve) insufficiency: Secondary | ICD-10-CM

## 2010-09-18 DIAGNOSIS — I25810 Atherosclerosis of coronary artery bypass graft(s) without angina pectoris: Secondary | ICD-10-CM

## 2010-09-18 DIAGNOSIS — I2589 Other forms of chronic ischemic heart disease: Secondary | ICD-10-CM

## 2010-09-18 DIAGNOSIS — I6529 Occlusion and stenosis of unspecified carotid artery: Secondary | ICD-10-CM | POA: Insufficient documentation

## 2010-09-18 DIAGNOSIS — I10 Essential (primary) hypertension: Secondary | ICD-10-CM

## 2010-09-18 DIAGNOSIS — I359 Nonrheumatic aortic valve disorder, unspecified: Secondary | ICD-10-CM

## 2010-09-18 DIAGNOSIS — E785 Hyperlipidemia, unspecified: Secondary | ICD-10-CM

## 2010-09-19 LAB — HEPATIC FUNCTION PANEL
ALT: 21 U/L (ref 0–53)
AST: 26 U/L (ref 0–37)
Alkaline Phosphatase: 39 U/L (ref 39–117)
Bilirubin, Direct: 0.1 mg/dL (ref 0.0–0.3)
Total Bilirubin: 0.6 mg/dL (ref 0.3–1.2)
Total Protein: 7.7 g/dL (ref 6.0–8.3)

## 2010-09-19 LAB — LIPID PANEL
Cholesterol: 97 mg/dL (ref 0–200)
HDL: 26.6 mg/dL — ABNORMAL LOW (ref >39.00)
LDL Cholesterol: 53 mg/dL (ref 0–99)
Total CHOL/HDL Ratio: 4
Triglycerides: 89 mg/dL (ref 0.0–149.0)
VLDL: 17.8 mg/dL (ref 0.0–40.0)

## 2010-09-24 ENCOUNTER — Ambulatory Visit: Payer: Self-pay | Admitting: Cardiology

## 2010-09-24 ENCOUNTER — Encounter: Payer: Self-pay | Admitting: Cardiology

## 2010-09-26 ENCOUNTER — Encounter: Payer: Self-pay | Admitting: Cardiology

## 2010-09-26 ENCOUNTER — Ambulatory Visit (INDEPENDENT_AMBULATORY_CARE_PROVIDER_SITE_OTHER): Payer: Medicare Other | Admitting: Cardiology

## 2010-09-26 VITALS — BP 119/70 | HR 57 | Ht 72.0 in | Wt 213.1 lb

## 2010-09-26 DIAGNOSIS — I2510 Atherosclerotic heart disease of native coronary artery without angina pectoris: Secondary | ICD-10-CM

## 2010-09-26 DIAGNOSIS — I25810 Atherosclerosis of coronary artery bypass graft(s) without angina pectoris: Secondary | ICD-10-CM

## 2010-09-26 DIAGNOSIS — I714 Abdominal aortic aneurysm, without rupture: Secondary | ICD-10-CM

## 2010-09-26 DIAGNOSIS — E785 Hyperlipidemia, unspecified: Secondary | ICD-10-CM

## 2010-09-26 DIAGNOSIS — I10 Essential (primary) hypertension: Secondary | ICD-10-CM

## 2010-09-26 DIAGNOSIS — I359 Nonrheumatic aortic valve disorder, unspecified: Secondary | ICD-10-CM

## 2010-09-26 DIAGNOSIS — I5032 Chronic diastolic (congestive) heart failure: Secondary | ICD-10-CM

## 2010-09-26 DIAGNOSIS — I6529 Occlusion and stenosis of unspecified carotid artery: Secondary | ICD-10-CM

## 2010-09-26 MED ORDER — SILDENAFIL CITRATE 50 MG PO TABS: ORAL_TABLET | ORAL | Status: DC

## 2010-09-26 NOTE — Patient Instructions (Addendum)
You can stop Doxazosin.  Call me if your blood pressure goes over 140 after you stop Doxazosin.--- Luana Shu   Your physician recommends that you return for a FASTING lipid profile/liver profile in 6 months. (October 2012)  414.05  401.9  Your physician wants you to follow-up in: 6 months with Dr Shirlee Latch. (October 2012).You will receive a reminder letter in the mail two months in advance. If you don't receive a letter, please call our office to schedule the follow-up appointment.

## 2010-09-26 NOTE — Assessment & Plan Note (Addendum)
Stable with no ischemic symptoms.  COntinue ASA, Plavix, statin, Coreg, ACEI.

## 2010-09-26 NOTE — Assessment & Plan Note (Signed)
Only mild on last echo.  May be improved due to better BP control.

## 2010-09-26 NOTE — Assessment & Plan Note (Signed)
Lipids have been well-controlled.  Will repeat in 6 months at the time of next appointment.  Goal LDL < 70.

## 2010-09-26 NOTE — Assessment & Plan Note (Signed)
BP is well-controlled on multiple agents.  He tells me that his urologist wants him to stop doxazosin.  I would also rather him not take this particular medication if it is not needed for urologic reasons.  He will stop doxazosin and follow his BP.  If BP runs high off doxazosin, would increase benazepril to 40 mg daily.

## 2010-09-26 NOTE — Assessment & Plan Note (Signed)
Stable chronic exertional dyspnea.  Not volume overloaded on exam.

## 2010-09-26 NOTE — Assessment & Plan Note (Signed)
Stable, repeat abdominal US in 3/13.

## 2010-09-26 NOTE — Progress Notes (Signed)
PCP: Dr. Jeannetta Nap  65 yo with h/o CAD s/p CABG, aortic aneurysm, HTN, DM2 returns to cardiology clinic.  Pt has been doing well.  Active in his church, plays the piano. BP is 119/70  today and has been under good control at home.  He walks daily for at least a mile.  He is a little short of breath by the time he reaches the end and he is short of breath with inclines.  Stable very atypical chest pain.  Echo in 4/12 with EF 50-55%, only mild aortic insufficiency (moderate in the past).  Stable moderate carotid stenosis and stable aortic aneurysm on recent carotid and abdominal ultrasounds.    ECG:  NSR, PACs  Labs (8/10): K 3.8, creatinine 1.12, LDL 61, HDL 29 Labs (3/11): HDL 32, LDL 55, LFTs normal Labs (98/11): LDL 55, HDL 27, K 3.9, creatinine 1.0 Labs (4/12): LDL 53, HDL 27  Allergies (verified):  No Known Drug Allergies  Past Medical History: 1. Coronary artery disease.  The patient had non-ST-elevation MI in February 2008 and there was an 80-90% proximal LAD stenosis, 90%proximal first diagonal stenosis, 99% ostial ramus stenosis.  The first obtuse marginal was subtotally occluded and there was an 80% mid RCA stenosis.  EF was estimated 25% on ventriculogram, coronary artery bypass grafting was done with a LIMA to the LAD, sequential saphenous vein graft to the PDA, and PLOM, saphenous graft to the diagonal, saphenous vein graft to the ramus. 2. Aortic insufficiency: Echo (3/11): EF 55-60%, moderate diastolic dysfunction, moderate aortic insufficiency, moderate LAE.  Echo (3/12) with EF 50-55%, grade II diastolic dysfunction, mild MR, mild aortic insufficiency.  3. Infrarenal AAA. Abdominal US 3/11 with AAA 3.9 x 3.9 cm.  Abdominal US 3/12 with AAA 3.8 x 3.9 cm 4. Hypertension. 5. Type 2 diabetes. 6. History of torn left rotator cuff. 7. History of obstructive/restrictive PFTs.  There is some reversibility with bronchodilator.  The patient actually never smoked.  He is on Spiriva. 8. Holter  (3/10):  Frequent PACs and blocked PACs.  PVCs with trigeminy.  No long pauses.  Slowest HR in 40s while asleep.  8. Carotid stenosis.  Carotid dopplers (3/12) with stable 40-59% bilateral ICA stenosis.   Family History: Both the patient's mother and father had abdominal aortic aneurysm and both died due to complications with attempted AAA repair.  Social History: The patient is a lifetime nonsmoker. He is unemployed but draws a pension from the [place].  He lives by himself.  He has no close family left, he only has cousins. He plays the piano in church.   Review of Systems        All systems reviewed and negative except as per HPI.   Current Outpatient Prescriptions  Medication Sig Dispense Refill  . aliskiren (TEKTURNA) 150 MG tablet Take 150 mg by mouth daily.        Marland Kitchen amLODipine (NORVASC) 10 MG tablet Take 10 mg by mouth daily.        Marland Kitchen aspirin 81 MG tablet Take 81 mg by mouth daily.        . benazepril (LOTENSIN) 40 MG tablet Take 40 mg by mouth daily.        . carvedilol (COREG) 12.5 MG tablet Take 18.75 mg by mouth 2 (two) times daily with a meal.        . clopidogrel (PLAVIX) 75 MG tablet Take 75 mg by mouth daily.        . Coenzyme Q10 (CO  Q 10) 100 MG CAPS Take by mouth daily.        . fish oil-omega-3 fatty acids 1000 MG capsule Take 2 g by mouth daily.        Marland Kitchen glipiZIDE (GLUCOTROL) 5 MG 24 hr tablet Take 5 mg by mouth 2 (two) times daily.       . hydrochlorothiazide 25 MG tablet Take 25 mg by mouth daily.        . Multiple Vitamin (MULTIVITAMIN) tablet Take 1 tablet by mouth daily.        . potassium chloride SA (K-DUR,KLOR-CON) 20 MEQ tablet Take 20 mEq by mouth daily.        . rosuvastatin (CRESTOR) 40 MG tablet Take 20 mg by mouth daily.        . salsalate (DISALCID) 500 MG tablet Take 500 mg by mouth 3 (three) times daily.        . sildenafil (VIAGRA) 50 MG tablet DO NOT USE NITROGYLCERIN WITHIN 24 HOURS OF TAKING VIAGRA  10 tablet  1  . tiotropium (SPIRIVA) 18 MCG  inhalation capsule Place 18 mcg into inhaler and inhale daily.        . traZODone (DESYREL) 100 MG tablet Take 100 mg by mouth at bedtime.       Marland Kitchen DISCONTD: doxazosin (CARDURA) 4 MG tablet Take 4 mg by mouth at bedtime.        Marland Kitchen DISCONTD: sildenafil (VIAGRA) 50 MG tablet Take 50 mg by mouth daily as needed.        Marland Kitchen DISCONTD: rosuvastatin (CRESTOR) 20 MG tablet Take 20 mg by mouth daily.           BP 119/70  Pulse 57  Ht 6' (1.829 m)  Wt 213 lb 1.9 oz (96.671 kg)  BMI 28.90 kg/m2 General:  Well developed, well nourished, in no acute distress. Neck:  Neck supple, no JVD. No masses, thyromegaly or abnormal cervical nodes. Lungs:  Clear bilaterally to auscultation. Heart:  Non-displaced PMI, chest non-tender; regular rate and rhythm, S1, S2 without murmurs, rubs. +S4. Carotid upstroke normal, no bruit. Pedals normal pulses. No edema, no varicosities. Abdomen:  Bowel sounds positive; abdomen soft and non-tender without masses, organomegaly, or hernias noted. No hepatosplenomegaly. Extremities:  No clubbing or cyanosis. Neurologic:  Alert and oriented x 3. Psych:  Normal affect.

## 2010-09-26 NOTE — Assessment & Plan Note (Signed)
Stable moderate carotid stenosis.  Repeat carotid dopplers in 3/13.

## 2010-09-27 ENCOUNTER — Other Ambulatory Visit: Payer: Self-pay | Admitting: Cardiology

## 2010-10-01 ENCOUNTER — Other Ambulatory Visit (HOSPITAL_COMMUNITY): Payer: Self-pay | Admitting: General Surgery

## 2010-10-01 DIAGNOSIS — C439 Malignant melanoma of skin, unspecified: Secondary | ICD-10-CM

## 2010-10-08 ENCOUNTER — Encounter (HOSPITAL_COMMUNITY)
Admission: RE | Admit: 2010-10-08 | Discharge: 2010-10-08 | Disposition: A | Discharge status: Home / Self Care | Payer: Medicare Other | Source: Ambulatory Visit | Attending: General Surgery | Admitting: General Surgery

## 2010-10-08 DIAGNOSIS — C4359 Malignant melanoma of other part of trunk: Secondary | ICD-10-CM | POA: Insufficient documentation

## 2010-10-08 DIAGNOSIS — C439 Malignant melanoma of skin, unspecified: Secondary | ICD-10-CM

## 2010-10-08 MED ORDER — TECHNETIUM TC 99M SULFUR COLLOID FILTERED
0.5000 | Freq: Once | INTRAVENOUS | Status: AC | PRN
  Administered 2010-10-08: 0.5 via INTRADERMAL

## 2010-10-09 ENCOUNTER — Telehealth: Payer: Self-pay | Admitting: Cardiology

## 2010-10-09 ENCOUNTER — Other Ambulatory Visit: Payer: Self-pay | Admitting: Dermatology

## 2010-10-11 NOTE — Telephone Encounter (Signed)
This was done by D. Wallace Cullens 10/10/10

## 2010-10-12 ENCOUNTER — Other Ambulatory Visit: Payer: Self-pay | Admitting: Oncology

## 2010-10-12 ENCOUNTER — Encounter (HOSPITAL_BASED_OUTPATIENT_CLINIC_OR_DEPARTMENT_OTHER): Payer: Medicare Other | Admitting: Oncology

## 2010-10-12 DIAGNOSIS — E785 Hyperlipidemia, unspecified: Secondary | ICD-10-CM

## 2010-10-12 DIAGNOSIS — C4359 Malignant melanoma of other part of trunk: Secondary | ICD-10-CM

## 2010-10-12 DIAGNOSIS — E119 Type 2 diabetes mellitus without complications: Secondary | ICD-10-CM

## 2010-10-12 DIAGNOSIS — Z85820 Personal history of malignant melanoma of skin: Secondary | ICD-10-CM

## 2010-10-12 DIAGNOSIS — I10 Essential (primary) hypertension: Secondary | ICD-10-CM

## 2010-10-12 LAB — CBC WITH DIFFERENTIAL/PLATELET
EOS%: 1.8 % (ref 0.0–7.0)
Eosinophils Absolute: 0.1 10*3/uL (ref 0.0–0.5)
LYMPH%: 30 % (ref 14.0–49.0)
MCH: 33.2 pg (ref 27.2–33.4)
MCV: 96.6 fL (ref 79.3–98.0)
MONO%: 10 % (ref 0.0–14.0)
NEUT#: 4.8 10*3/uL (ref 1.5–6.5)
Platelets: 156 10*3/uL (ref 140–400)
RBC: 4.24 10*6/uL (ref 4.20–5.82)
RDW: 13.1 % (ref 11.0–14.6)

## 2010-10-12 LAB — COMPREHENSIVE METABOLIC PANEL
AST: 23 U/L (ref 0–37)
Alkaline Phosphatase: 35 U/L — ABNORMAL LOW (ref 39–117)
BUN: 14 mg/dL (ref 6–23)
Glucose, Bld: 142 mg/dL — ABNORMAL HIGH (ref 70–99)
Sodium: 141 mEq/L (ref 135–145)
Total Bilirubin: 0.5 mg/dL (ref 0.3–1.2)
Total Protein: 7.9 g/dL (ref 6.0–8.3)

## 2010-10-15 ENCOUNTER — Other Ambulatory Visit (HOSPITAL_COMMUNITY): Payer: Self-pay | Admitting: General Surgery

## 2010-10-15 DIAGNOSIS — C4359 Malignant melanoma of other part of trunk: Secondary | ICD-10-CM

## 2010-10-16 ENCOUNTER — Telehealth: Payer: Self-pay | Admitting: Cardiology

## 2010-10-16 NOTE — Telephone Encounter (Signed)
Increase benazepril to 40 mg daily with BMET in 2 wks.

## 2010-10-16 NOTE — Telephone Encounter (Signed)
Pt b/p is elevated since medication change and he was told to notify us if that happen

## 2010-10-16 NOTE — Telephone Encounter (Signed)
Left message to call back  

## 2010-10-16 NOTE — Telephone Encounter (Signed)
Spoke with pt. He checks blood pressure daily and reports for last week and a half it has consistently been running 130-150/ 80 or below 80.   When checked at dermatology office recently it was 140/75.  Per last office visit note with Dr.McLean pt to call if blood pressure greater than 140.  I told pt I would forward to Dr. Shirlee Latch and we would call him with his recommendations.

## 2010-10-17 NOTE — Telephone Encounter (Signed)
Increase Coreg to 25 mg bid. 

## 2010-10-17 NOTE — Telephone Encounter (Signed)
Spoke with pt.  He is already taking benazepril 40 mg by mouth daily.  Blood pressure last night 117/70. This AM blood pressure 157/70.  I told pt I would forward to Dr. Shirlee Latch for recommendations.

## 2010-10-17 NOTE — Telephone Encounter (Signed)
He can increase Coreg to 25 mg bid.

## 2010-10-18 ENCOUNTER — Telehealth: Payer: Self-pay | Admitting: *Deleted

## 2010-10-18 NOTE — Telephone Encounter (Signed)
I talked with pt by telephone. He verbalized understanding.

## 2010-10-18 NOTE — Telephone Encounter (Signed)
Call Documentation     Katina Dung, RN 10/18/2010 8:52 AM Signed  I talked with pt by telephone. He verbalized understanding. Marca Ancona, MD 10/17/2010 11:29 PM Signed  Increase Coreg to 25 mg bid.  Marca Ancona, MD 10/17/2010 11:29 PM Signed  He can increase Coreg to 25 mg bid.  Genice Rouge, RN 10/17/2010 4:55 PM Signed  Spoke with pt. He is already taking benazepril 40 mg by mouth daily. Blood pressure last night 117/70. This AM blood pressure 157/70. I told pt I would forward to Dr. Shirlee Latch for recommendations.  Genice Rouge, RN 10/16/2010 5:52 PM Signed  Left message to call back. Marca Ancona, MD 10/16/2010 4:24 PM Signed  Increase benazepril to 40 mg daily with BMET in 2 wks.  Genice Rouge, RN 10/16/2010 11:20 AM Signed  Spoke with pt. He checks blood pressure daily and reports for last week and a half it has consistently been running 130-150/ 80 or below 80. When checked at dermatology office recently it was 140/75. Per last office visit note with Dr.McLean pt to call if blood pressure greater than 140. I told pt I would forward to Dr. Shirlee Latch and we would call him with his recommendations. Omer Jack 10/16/2010 10:39 AM Signed  Pt b/p is elevated since medication change and he was told to notify us if that happen

## 2010-10-19 ENCOUNTER — Encounter (HOSPITAL_COMMUNITY)
Admission: RE | Admit: 2010-10-19 | Discharge: 2010-10-19 | Disposition: A | Discharge status: Home / Self Care | Payer: Medicare Other | Source: Ambulatory Visit | Attending: Emergency Medicine | Admitting: Emergency Medicine

## 2010-10-19 ENCOUNTER — Encounter (HOSPITAL_COMMUNITY)
Admission: RE | Admit: 2010-10-19 | Discharge: 2010-10-19 | Disposition: A | Discharge status: Home / Self Care | Payer: Medicare Other | Source: Ambulatory Visit | Attending: General Surgery | Admitting: General Surgery

## 2010-10-19 ENCOUNTER — Other Ambulatory Visit (HOSPITAL_COMMUNITY): Payer: Self-pay | Admitting: Emergency Medicine

## 2010-10-19 DIAGNOSIS — C439 Malignant melanoma of skin, unspecified: Secondary | ICD-10-CM

## 2010-10-19 LAB — COMPREHENSIVE METABOLIC PANEL
BUN: 17 mg/dL (ref 6–23)
CO2: 29 mEq/L (ref 19–32)
Calcium: 9.5 mg/dL (ref 8.4–10.5)
Chloride: 104 mEq/L (ref 96–112)
Creatinine, Ser: 1.24 mg/dL (ref 0.4–1.5)
GFR calc Af Amer: >60 mL/min (ref >60)
GFR calc non Af Amer: 59 mL/min — ABNORMAL LOW (ref >60)
Glucose, Bld: 102 mg/dL — ABNORMAL HIGH (ref 70–99)
Total Bilirubin: 0.5 mg/dL (ref 0.3–1.2)

## 2010-10-19 LAB — CBC
MCH: 32.6 pg (ref 26.0–34.0)
MCV: 92.8 fL (ref 78.0–100.0)
Platelets: 155 10*3/uL (ref 150–400)
RBC: 4.33 MIL/uL (ref 4.22–5.81)
RDW: 12.7 % (ref 11.5–15.5)

## 2010-10-19 LAB — SURGICAL PCR SCREEN
MRSA, PCR: NEGATIVE
Staphylococcus aureus: POSITIVE — AB

## 2010-10-19 LAB — DIFFERENTIAL
Basophils Relative: 0 % (ref 0–1)
Eosinophils Absolute: 0.1 10*3/uL (ref 0.0–0.7)
Eosinophils Relative: 2 % (ref 0–5)
Lymphs Abs: 2.8 10*3/uL (ref 0.7–4.0)
Monocytes Relative: 12 % (ref 3–12)
Neutrophils Relative %: 50 % (ref 43–77)

## 2010-10-19 LAB — PROTIME-INR: Prothrombin Time: 14.2 seconds (ref 11.6–15.2)

## 2010-10-22 ENCOUNTER — Inpatient Hospital Stay (HOSPITAL_COMMUNITY)
Admission: RE | Admit: 2010-10-22 | Discharge: 2010-10-25 | DRG: 581 | Disposition: A | Discharge status: Home Health | Payer: Medicare Other | Source: Ambulatory Visit | Attending: General Surgery | Admitting: General Surgery

## 2010-10-22 ENCOUNTER — Ambulatory Visit (HOSPITAL_COMMUNITY)
Admission: RE | Admit: 2010-10-22 | Discharge: 2010-10-22 | Disposition: A | Discharge status: Home / Self Care | Payer: Medicare Other | Source: Ambulatory Visit | Attending: General Surgery | Admitting: General Surgery

## 2010-10-22 ENCOUNTER — Other Ambulatory Visit: Payer: Self-pay | Admitting: General Surgery

## 2010-10-22 DIAGNOSIS — I252 Old myocardial infarction: Secondary | ICD-10-CM

## 2010-10-22 DIAGNOSIS — J449 Chronic obstructive pulmonary disease, unspecified: Secondary | ICD-10-CM | POA: Diagnosis present

## 2010-10-22 DIAGNOSIS — C4359 Malignant melanoma of other part of trunk: Secondary | ICD-10-CM | POA: Insufficient documentation

## 2010-10-22 DIAGNOSIS — Z01810 Encounter for preprocedural cardiovascular examination: Secondary | ICD-10-CM

## 2010-10-22 DIAGNOSIS — J4489 Other specified chronic obstructive pulmonary disease: Secondary | ICD-10-CM | POA: Diagnosis present

## 2010-10-22 DIAGNOSIS — Z01812 Encounter for preprocedural laboratory examination: Secondary | ICD-10-CM

## 2010-10-22 DIAGNOSIS — Z951 Presence of aortocoronary bypass graft: Secondary | ICD-10-CM

## 2010-10-22 DIAGNOSIS — E119 Type 2 diabetes mellitus without complications: Secondary | ICD-10-CM | POA: Diagnosis present

## 2010-10-22 LAB — GLUCOSE, CAPILLARY
Glucose-Capillary: 135 mg/dL — ABNORMAL HIGH (ref 70–99)
Glucose-Capillary: 151 mg/dL — ABNORMAL HIGH (ref 70–99)
Glucose-Capillary: 159 mg/dL — ABNORMAL HIGH (ref 70–99)

## 2010-10-22 MED ORDER — TECHNETIUM TC 99M SULFUR COLLOID FILTERED
0.5000 | Freq: Once | INTRAVENOUS | Status: AC | PRN
  Administered 2010-10-22: 0.5 via INTRADERMAL

## 2010-10-23 LAB — GLUCOSE, CAPILLARY
Glucose-Capillary: 131 mg/dL — ABNORMAL HIGH (ref 70–99)
Glucose-Capillary: 113 mg/dL — ABNORMAL HIGH (ref 70–99)

## 2010-10-24 LAB — GLUCOSE, CAPILLARY
Glucose-Capillary: 93 mg/dL (ref 70–99)
Glucose-Capillary: 162 mg/dL — ABNORMAL HIGH (ref 70–99)

## 2010-10-25 LAB — GLUCOSE, CAPILLARY

## 2010-10-28 ENCOUNTER — Emergency Department (HOSPITAL_COMMUNITY)
Admission: EM | Admit: 2010-10-28 | Discharge: 2010-10-29 | Disposition: A | Discharge status: Nursing Home (Includes ICF) | Payer: Medicare Other | Source: Home / Self Care | Attending: Emergency Medicine | Admitting: Emergency Medicine

## 2010-10-28 ENCOUNTER — Emergency Department (HOSPITAL_COMMUNITY): Payer: Medicare Other

## 2010-10-28 DIAGNOSIS — Z85820 Personal history of malignant melanoma of skin: Secondary | ICD-10-CM | POA: Insufficient documentation

## 2010-10-28 DIAGNOSIS — R0682 Tachypnea, not elsewhere classified: Secondary | ICD-10-CM | POA: Insufficient documentation

## 2010-10-28 DIAGNOSIS — I252 Old myocardial infarction: Secondary | ICD-10-CM | POA: Insufficient documentation

## 2010-10-28 DIAGNOSIS — R55 Syncope and collapse: Secondary | ICD-10-CM | POA: Insufficient documentation

## 2010-10-28 DIAGNOSIS — Y838 Other surgical procedures as the cause of abnormal reaction of the patient, or of later complication, without mention of misadventure at the time of the procedure: Secondary | ICD-10-CM | POA: Insufficient documentation

## 2010-10-28 DIAGNOSIS — E119 Type 2 diabetes mellitus without complications: Secondary | ICD-10-CM | POA: Insufficient documentation

## 2010-10-28 DIAGNOSIS — I10 Essential (primary) hypertension: Secondary | ICD-10-CM | POA: Insufficient documentation

## 2010-10-28 DIAGNOSIS — IMO0002 Reserved for concepts with insufficient information to code with codable children: Secondary | ICD-10-CM | POA: Insufficient documentation

## 2010-10-28 LAB — CBC
HCT: 27.7 % — ABNORMAL LOW (ref 39.0–52.0)
MCV: 93 fL (ref 78.0–100.0)
Platelets: 240 10*3/uL (ref 150–400)
RBC: 2.98 MIL/uL — ABNORMAL LOW (ref 4.22–5.81)
WBC: 14 10*3/uL — ABNORMAL HIGH (ref 4.0–10.5)

## 2010-10-28 LAB — BASIC METABOLIC PANEL
BUN: 22 mg/dL (ref 6–23)
Chloride: 96 mEq/L (ref 96–112)
Potassium: 3.8 mEq/L (ref 3.5–5.1)

## 2010-10-28 LAB — DIFFERENTIAL
Eosinophils Absolute: 0.1 10*3/uL (ref 0.0–0.7)
Lymphocytes Relative: 19 % (ref 12–46)
Lymphs Abs: 2.6 10*3/uL (ref 0.7–4.0)
Neutrophils Relative %: 74 % (ref 43–77)

## 2010-10-29 ENCOUNTER — Emergency Department (HOSPITAL_COMMUNITY): Payer: Medicare Other

## 2010-10-29 ENCOUNTER — Inpatient Hospital Stay (HOSPITAL_COMMUNITY)
Admission: EM | Admit: 2010-10-29 | Discharge: 2010-11-06 | DRG: 920 | Disposition: A | Discharge status: Skilled Nursing Facility | Payer: Medicare Other | Source: Ambulatory Visit | Attending: General Surgery | Admitting: General Surgery

## 2010-10-29 DIAGNOSIS — D62 Acute posthemorrhagic anemia: Secondary | ICD-10-CM | POA: Diagnosis present

## 2010-10-29 DIAGNOSIS — Z951 Presence of aortocoronary bypass graft: Secondary | ICD-10-CM

## 2010-10-29 DIAGNOSIS — I252 Old myocardial infarction: Secondary | ICD-10-CM

## 2010-10-29 DIAGNOSIS — E119 Type 2 diabetes mellitus without complications: Secondary | ICD-10-CM | POA: Diagnosis present

## 2010-10-29 DIAGNOSIS — J449 Chronic obstructive pulmonary disease, unspecified: Secondary | ICD-10-CM | POA: Diagnosis present

## 2010-10-29 DIAGNOSIS — J4489 Other specified chronic obstructive pulmonary disease: Secondary | ICD-10-CM | POA: Diagnosis present

## 2010-10-29 DIAGNOSIS — Z7982 Long term (current) use of aspirin: Secondary | ICD-10-CM

## 2010-10-29 DIAGNOSIS — I6529 Occlusion and stenosis of unspecified carotid artery: Secondary | ICD-10-CM | POA: Diagnosis present

## 2010-10-29 DIAGNOSIS — Z85820 Personal history of malignant melanoma of skin: Secondary | ICD-10-CM

## 2010-10-29 DIAGNOSIS — Y832 Surgical operation with anastomosis, bypass or graft as the cause of abnormal reaction of the patient, or of later complication, without mention of misadventure at the time of the procedure: Secondary | ICD-10-CM | POA: Diagnosis present

## 2010-10-29 DIAGNOSIS — I10 Essential (primary) hypertension: Secondary | ICD-10-CM | POA: Diagnosis present

## 2010-10-29 DIAGNOSIS — I2510 Atherosclerotic heart disease of native coronary artery without angina pectoris: Secondary | ICD-10-CM | POA: Diagnosis present

## 2010-10-29 DIAGNOSIS — IMO0002 Reserved for concepts with insufficient information to code with codable children: Principal | ICD-10-CM | POA: Diagnosis present

## 2010-10-29 LAB — GLUCOSE, CAPILLARY
Glucose-Capillary: 188 mg/dL — ABNORMAL HIGH (ref 70–99)
Glucose-Capillary: 173 mg/dL — ABNORMAL HIGH (ref 70–99)
Glucose-Capillary: 155 mg/dL — ABNORMAL HIGH (ref 70–99)

## 2010-10-29 LAB — CBC
HCT: 19.6 % — ABNORMAL LOW (ref 39.0–52.0)
Hemoglobin: 7.3 g/dL — ABNORMAL LOW (ref 13.0–17.0)
MCH: 31.3 pg (ref 26.0–34.0)
MCH: 31.5 pg (ref 26.0–34.0)
MCV: 89.7 fL (ref 78.0–100.0)
MCV: 88.3 fL (ref 78.0–100.0)
Platelets: 174 10*3/uL (ref 150–400)
RBC: 2.33 MIL/uL — ABNORMAL LOW (ref 4.22–5.81)
RBC: 2.22 MIL/uL — ABNORMAL LOW (ref 4.22–5.81)
WBC: 12.5 10*3/uL — ABNORMAL HIGH (ref 4.0–10.5)
WBC: 13.1 10*3/uL — ABNORMAL HIGH (ref 4.0–10.5)

## 2010-10-29 LAB — ABO/RH: ABO/RH(D): AB POS

## 2010-10-29 LAB — PREPARE RBC (CROSSMATCH)

## 2010-10-30 LAB — POCT I-STAT 4, (NA,K, GLUC, HGB,HCT)
Glucose, Bld: 187 mg/dL — ABNORMAL HIGH (ref 70–99)
HCT: 27 % — ABNORMAL LOW (ref 39.0–52.0)
Sodium: 138 mEq/L (ref 135–145)

## 2010-10-30 LAB — PREPARE PLATELET PHERESIS: Unit division: 0

## 2010-10-30 LAB — CBC
Hemoglobin: 7.4 g/dL — ABNORMAL LOW (ref 13.0–17.0)
MCH: 31.1 pg (ref 26.0–34.0)
MCHC: 35.2 g/dL (ref 30.0–36.0)
Platelets: 168 10*3/uL (ref 150–400)
RDW: 14.9 % (ref 11.5–15.5)

## 2010-10-30 LAB — GLUCOSE, CAPILLARY
Glucose-Capillary: 134 mg/dL — ABNORMAL HIGH (ref 70–99)
Glucose-Capillary: 164 mg/dL — ABNORMAL HIGH (ref 70–99)

## 2010-10-30 NOTE — Assessment & Plan Note (Signed)
[facility]                             PULMONARY OFFICE NOTE   SAJAD, GLANDER                     MRN:          [id number]  DATE:04/09/2007                            DOB:          04/16/1946    Mr. 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  He is status post bypass surgery.  He is doing well from a pulmonary  standpoint, less dyspneic.  He is on Spiriva daily.  Pulmonary functions  were obtained and showed severe peripheral air flow obstructions,  improvement following bronchodilators.  Total lung capacity normal, 80%  predicted.  Diffusion capacity slightly low at 72% predicted.  A CT scan  of chest showed no fibrosis, very mild lower lung ground-glass opacities  and previous bypass surgery.   The patient's medication profile is unchanged.   ON EXAM:  Temperature 98, blood pressure 132/70, pulse 52, saturation  100% on room air.  CHEST:  Showed to be clear today, without evidence of wheeze or rhonchi.  CARDIAC EXAM:  Showed a regular rate and rhythm without S3, normal S1  and S2.  ABDOMEN:  Soft, nontender.  EXTREMITIES:  Showed no edema or clubbing.  SKIN:  Clear.   IMPRESSION:  Impression on this patient is that of moderate peripheral  air flow obstruction with positive response to Spiriva.   PLAN:  The plan for the patient is to maintain Spiriva as is.  We will  see the patient back in four months.   Note is made that he has already received a flu vaccine for this season.     Charlcie Cradle Delford Field, MD, [facility]  Electronically Signed    PEW/MedQ  DD: 04/09/2007  DT: 04/10/2007  Job #: [id number]

## 2010-10-30 NOTE — Assessment & Plan Note (Signed)
[facility]                             PULMONARY OFFICE NOTE   CHUNG, CHAGOYA                     MRN:          [id number]  DATE:03/06/2007                            DOB:          August 21, 1945    [place] returns in followup.  A 65 year old white male who had acute  respiratory distress syndrome following fracture capillary syndrome with  acute lung injury around the time that he had emergent bypass surgery in  February of 2008.  He weaned off of Combivent and is a life-long never-  smoker, but did have some minimal passive smoke exposure.  We weaned him  off of oxygen as well in March of this year.  In the interim, he has had  continued shortness of breath with exertion, going up stairs, and  sitting still.  He has good and bad days.   MEDICATIONS:  1. He is on an increased dose now of Coreg 25 mg b.i.d.  2. Tekturna 150 mg daily.  3. Lanoxin 125 mcg daily.  4. Lotrel 10/40 daily.  5. Potassium 10 mEq daily.  6. Furosemide 40 mg daily.  7. Doxazosin 4 mg daily.  8. Glipizide one-half 5 mg b.i.d.  9. Simvastatin 20 mg daily.  10.Aspirin 325 mg daily.   EXAM:  Temperature is 97, blood pressure 144/76, pulse 53, saturation  100% on room air.  CHEST:  Showed distant breath sounds, dry rales at the bases.  CARDIAC:  Showed a regular rate and rhythm without S3.  Normal S1, S2.  ABDOMEN:  Soft, nontender.  EXTREMITIES:  No edema or clubbing.  SKIN:  Clear.   Spirometry shows severe restrictive defect and obstructive defect FEF25-  75 low at 37% predicted.  However, the FEV1 was also low at 42%  predicted.  FVC was 52% predicted.   IMPRESSION:  Combined restrictive and obstructive defect with previous  acute lung injury.   PLAN:  The patient to begin Spiriva 1 capsule 2 sprays daily.  Prescriptions were sent to [facility], and a sample was  given.  The patient was instructed as to its proper use.  We will also obtain a high  resolution CT scan of the chest to evaluate  for evidence of fibrotic changes.  Will also obtain for the patient high  resolution CT scan of the chest.     Charlcie Cradle. Delford Field, MD, [facility]  Electronically Signed    PEW/MedQ  DD: 03/06/2007  DT: 03/06/2007  Job #: [id number]   cc:   Windle Guard, M.D.  Darlin Priestly, MD

## 2010-10-30 NOTE — Assessment & Plan Note (Signed)
[facility]                            CARDIOLOGY OFFICE NOTE   CAEDEN, FOOTS                     MRN:          [id number]  DATE:08/01/2008                            DOB:          Jul 21, 1945    ADDENDUM   ASSESSMENT AND PLAN:  Syncope.  The patient did have one episode of  syncope in November 2009.  It does sound like micturition type syncope  as he was using the bathroom when it happened.  It has not repeated.  He  has not had episodes of presyncope.  Given his mildly depressed left  ventricular systolic function in 2008, I think is reasonable to get a 24-  hour Holter monitor to make sure that he is not having any significant  arrhythmias or becoming significantly bradycardic especially so we are  going to slightly increase his Coreg.  We will also check an  echocardiogram again just to make sure his left ventricular function is  above ICD range.     Marca Ancona, MD     DM/MedQ  DD: 08/01/2008  DT: 08/02/2008  Job #: [id number]

## 2010-10-30 NOTE — Assessment & Plan Note (Signed)
[facility]                            CARDIOLOGY OFFICE NOTE   YECHESKEL, KUREK                     MRN:          [id number]  DATE:08/01/2008                            DOB:          11-Jul-1945    PRIMARY CARE PHYSICIAN:  1. Maurice March, MD at [facility]  2. Madaline Savage Id-Din, FNP at [facility].   HISTORY OF PRESENT ILLNESS:  This is a 65 year old with a history of  coronary artery disease and aortic aneurysm, hypertension, and diabetes  who comes to Cardiology Clinic to establish care.  The patient did  present with a non-ST-elevation MI back in February 2008.  He was noted  to have severe three-vessel disease and underwent coronary artery bypass  grafting.  At that time, his course was complicated by pulmonary edema.  At the time of his initial presentation, he was intubated for a few  days.  After discharge, he continued to have significant shortness of  breath.  He had PFTs showing both obstructive and restrictive defects.  He does state that he never smoked before.  He has been on Spiriva since  that time as there was some reversibility of his PFTs on  bronchodilators.  The patient is essentially stable today.  His blood  pressures have been running high since his carvedilol was cut back from  25 mg twice a day to 12.5 mg twice a day, it is 160/70 today.  He does  report a sharp pain in his left upper chest.  This sharp pain has been  actually fairly constant and has been present ever since his bypass  surgery.  It does get worse with certain movements of his arm, and he  says that the pain has lessened considerably as time passes from the  surgery.  The only other chest pain he gets is an occasional very sharp,  stinging, nonexertional type chest pain that he gets in his central  chest.  This pain last only for about a second and then completely  resolves.  He does not get chest pain with exertion.  He does get short  of  breath with exertion and walking up an incline or up a flight of  steps.  This has been a consistent pattern ever since bypass surgery.  He has had no change in exercise tolerance recently.  He does try to  walk 1-2 miles a day around a track.  He has no chest pain with his  walk, he does get very mildly short of breath after he finishes his  third lab, but he is able to keep going and finish up a mile to 2 miles.  The patient also reports an episode of syncope in November 2009, he was  urinating in the bathroom and became lightheaded while he is urinating  and passed out and fell.  He was only unconscious momentarily.  He has  had no further episodes of syncope or presyncope.   PAST MEDICAL HISTORY:  1. Coronary artery disease.  The patient had non-ST-elevation MI in      February 2008 and there was an  80-90% proximal LAD stenosis, 90%      proximal first diagonal stenosis, 99% ostial ramus stenosis.  The      first obtuse marginal was subtotally occluded and there was an 80%      mid RCA stenosis.  EF was estimated 25% on ventriculogram, coronary      artery bypass grafting was done with a LIMA to the LAD, sequential      saphenous vein graft to the PDA, and PLOM, saphenous graft to the      diagonal, saphenous vein graft to the ramus.  2. Ischemic cardiomyopathy.  The patient's most recent echo was in      February 2008, EF was 40-45%.  There was mild diffuse hypokinesis.      There was mild aortic insufficiency, mild mitral regurgitation,      there was mild LV hypertrophy, pulmonary systolic pressure was 30      mmHg.  3. Infrarenal AAA.  The last ultrasound was done in October 2009 and      AAA measured 3.4 x 4.3 cm.  4. Hypertension.  5. Type 2 diabetes.  6. History torn left rotator cuff.  7. History of obstructive/restrictive PFTs.  There is some      reversibility of bronchodilator.  The patient actually never      smoked.  He is on Spiriva.  8. Carotid stenosis.  The  patient had mild bilateral internal carotid      stenosis of 40-59% on last carotid Dopplers in February 2010.   SOCIAL HISTORY:  The patient is a lifetime nonsmoker.  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  He lives by himself.  He has no close family left,  he only has cousins.   FAMILY HISTORY:  Both the patient's mother and father had abdominal  aortic aneurysm and both died due to complications with attempted AAA  repair.   REVIEW OF SYSTEMS:  Negative except as noted in the history present  illness.   MEDICATIONS:  1. Coreg 12.5 mg b.i.d.  2. Lipitor 10 mg daily.  3. Benazepril 40 mg daily.  4. Norvasc 10 mg daily.  5. Aspirin 81 mg daily.  6. Glipizide ER 2.5 mg b.i.d.  7. Doxazosin 4 mg daily.  8. Aliskiren 150 mg daily.  9. Plavix 75 mg daily.  10.Spiriva.   Most recent labs in February 2010, LDL 85, HDL 31, triglycerides 123.  In October 2009, creatinine 1.1 and potassium 4.2.  EKG reviewed today  shows normal sinus rhythm with frequent PACs.   PHYSICAL EXAMINATION:  VITAL SIGNS:  Blood pressure 160/70, heart rate  65 and regular, and weighs 106 pounds.  GENERAL:  This is a well-developed male in no apparent distress.  NEUROLOGIC:  Alert and oriented x3.  Normal affect.  LUNGS:  Clear to auscultation bilaterally.  Normal respiratory effort.  CARDIOVASCULAR:  Heart regular S1 and S2.  There is an occasional  premature beat heard.  There is an S4.  There is no S3.  There is no  murmur.  There is no peripheral edema.  There are 2+ posterior tibial  pulses bilaterally.  There is no carotid bruit.  ABDOMEN:  Soft and nontender.  No hepatosplenomegaly.  Normal bowel  sounds.  NECK:  There is no JVD.  There is no thyromegaly or thyroid nodule.  HEENT:  Normal exam.  EXTREMITIES:  No clubbing or  cyanosis.  SKIN:  Normal exam.  MUSCULOSKELETAL:  Normal exam.   ASSESSMENT AND PLAN:  This is a  65 year old with history of coronary  artery disease, abdominal aortic aneurysm, hypertension, diabetes, who  presents to Cardiology Clinic to establish care.  1. Coronary artery disease.  The patient is status post coronary      artery bypass grafting in 2008.  He does have chronic atypical      chest pain.  It is a mild, it is located in his left upper chest,      and is essentially constant.  He has another pattern of chest pain      that is a stabbing pain that last only for about a second.  This      also has been going on since his surgery.  There has been no change      at all to his chest pain pattern.  His chest pain is quite      atypical.  I think it is probably musculoskeletal related to his      prior sternotomy.  Therefore, I think we should continue with      medical management as we are.  He is continue on his aspirin, his      Plavix, his ACE inhibitor, his statin, and beta-blocker.  2. Ischemic cardiomyopathy.  The patient did have ejection fraction of      40-45% on most recent echo in 2008.  He does have stable dyspnea on      exertion.  He is short of breath after walking up an incline or up      a flight of steps.  He is not volume overloaded on exam.  I do      think it would be reasonable especially given his history of      syncope in November to go ahead and get a repeat echocardiogram to      assess his left ventricular function as well as to assess his      valves.  3. Hypertension.  The patient's blood pressure is quite elevated today      at 160/70, it likely went up after his Coreg was dropped.  I will      plan on increasing his Coreg today to 18.75 mg twice a day.  He was      on 25 mg twice day in the past.  I am not going to increase it that      high as apparently he was somewhat bradycardic.  I will also add      hydrochlorothiazide 25 mg daily and potassium chloride 20 mEq      daily.  I will have the patient come back in 2 weeks for a Chem-7       and for a blood pressure check on this new regimen.  4. Hyperlipidemia.  The patient's LDL was 85 when checked this month.      My goal for him would be LDL less than 70. We are going to gently      increase his Lipitor to 20 mg daily and hopefully we can get him      down to goal.  5. History of abdominal aortic aneurysm, most recent study was done in      October 2009.  We will go ahead and get a repeat aneurysm study in      April 2010 at a 19-month interval.  If this is stable then we will  go to a 1-year interval.  6. Carotid stenosis.  The patient has stable carotid disease.  We will      have follow up in 1 year from today which would be in February      2011.  7. Syncope.  The patient did have one episode of syncope in November      2009.  It does sound like micturition-type syncope as he was using      the bathroom when it happened.  It has not repeated.  He has not      had episodes of presyncope.  Given his mildly depressed left      ventricular systolic function in 2008, I think is reasonable to get      a 24-hour Holter monitor to make sure that he is not having any      significant arrhythmias or becoming significantly      bradycardic especially as we are going to slightly increase his      Coreg.  We will also check an echocardiogram again just to make      sure his left ventricular function is above ICD range.      Marca Ancona, MD  Electronically Signed    DM/MedQ  DD: 08/01/2008  DT: 08/02/2008  Job #: [id number]   cc:   Maurice March, M.D.  Madaline Savage Id-Din, FNP

## 2010-10-30 NOTE — Op Note (Signed)
  NAME:  Mitchell [name], WHISTLER NO.:  [id number]  MEDICAL RECORD NO.:  [id number]           PATIENT TYPE:  I  LOCATION:  3309                         FACILITY:  [facility]  PHYSICIAN:  Sharlet Salina T. Karan Inclan, M.D.DATE OF BIRTH:  08-05-1945  DATE OF PROCEDURE:  10/29/2010 DATE OF DISCHARGE:                              OPERATIVE REPORT   PREOPERATIVE DIAGNOSIS:  Wound hematoma.  POSTOPERATIVE DIAGNOSIS:  Wound hematoma.  SURGICAL PROCEDURES:  Wound exploration and evacuation of hematoma.  SURGEON:  Lorne Skeens. Janifer Gieselman, MD  ANESTHESIA:  General.  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  The patient developed postoperative bleeding.  He had been on Plavix.  He is admitted by Dr. Freida Busman and the wound was partially opened and clot evacuated, but he has continued large hematoma in the upper wound, some ongoing oozing from the packing. I have recommended proceeding back to the operating room for evacuation of hematoma, wound exploration or reclosure.  Risks of bleeding, infection, anesthetic complications were discussed.  He was brought to the operating room for this procedure.  DESCRIPTION OF OPERATION:  The patient was brought to the operating room and general endotracheal anesthesia was induced in the stretcher and he was rolled in the prone position.  The back was widely sterilely prepped and draped.  He received preoperative IV antibiotics.  Correct patient procedure were verified.  In the previous Steri-Strips, suture material were removed.  The majority of the wound along its lower portion was opened.  There had been a large amount of packing that was removed as a dressing was removed from the lower part of the wound.  I left the upper 7 or 8 cm closed.  There was a large amount of organized clot underneath the flaps, probably 500 or 600 mL  volume that was completely evacuated and then the wound was thoroughly irrigated and all clots removed with sponges.  Following this, I carefully examined all areas of the wound. There was not any single large obvious bleeding source.  At the very superior aspect of the wound, there was definite oozing.  This was controlled with cautery and I did place a Surgicel pack here.  The remainder of the wound was examined repeatedly sequentially with dry laps and small areas of oozing controlled with cautery until there was no evidence of any active bleeding.  Two 19 Blake drains were left through separate stab wounds and left underneath the flaps on either side.  The wound was then reclosed with interrupted 2-0 nylon sutures without undue tension.  Dry sterile dressings were applied.  Sponge, needle, and instrument counts were correct.  The patient was taken to recovery room in good condition.     Lorne Skeens. Sallie Staron, M.D.     Tory Emerald  D:  10/29/2010  T:  10/29/2010  Job:  [id number]  Electronically Signed by Glenna Fellows M.D. on 10/30/2010 04:42:14 PM

## 2010-10-31 LAB — CBC
HCT: 20.4 % — ABNORMAL LOW (ref 39.0–52.0)
Hemoglobin: 7 g/dL — ABNORMAL LOW (ref 13.0–17.0)
MCH: 31 pg (ref 26.0–34.0)
MCHC: 34.3 g/dL (ref 30.0–36.0)
RBC: 2.26 MIL/uL — ABNORMAL LOW (ref 4.22–5.81)

## 2010-10-31 LAB — GLUCOSE, CAPILLARY
Glucose-Capillary: 130 mg/dL — ABNORMAL HIGH (ref 70–99)
Glucose-Capillary: 149 mg/dL — ABNORMAL HIGH (ref 70–99)
Glucose-Capillary: 180 mg/dL — ABNORMAL HIGH (ref 70–99)
Glucose-Capillary: 160 mg/dL — ABNORMAL HIGH (ref 70–99)

## 2010-11-01 LAB — CBC
HCT: 21.6 % — ABNORMAL LOW (ref 39.0–52.0)
Hemoglobin: 7.4 g/dL — ABNORMAL LOW (ref 13.0–17.0)
MCH: 31.6 pg (ref 26.0–34.0)
MCHC: 34.3 g/dL (ref 30.0–36.0)
RDW: 15.3 % (ref 11.5–15.5)

## 2010-11-01 LAB — CROSSMATCH
ABO/RH(D): AB POS
Unit division: 0

## 2010-11-01 LAB — GLUCOSE, CAPILLARY: Glucose-Capillary: 209 mg/dL — ABNORMAL HIGH (ref 70–99)

## 2010-11-02 LAB — CROSSMATCH
Antibody Screen: NEGATIVE
Unit division: 0
Unit division: 0

## 2010-11-02 LAB — GLUCOSE, CAPILLARY
Glucose-Capillary: 119 mg/dL — ABNORMAL HIGH (ref 70–99)
Glucose-Capillary: 179 mg/dL — ABNORMAL HIGH (ref 70–99)
Glucose-Capillary: 147 mg/dL — ABNORMAL HIGH (ref 70–99)
Glucose-Capillary: 157 mg/dL — ABNORMAL HIGH (ref 70–99)

## 2010-11-02 NOTE — Consult Note (Signed)
NAME:  Mitchell [name], VANATTA NO.:  [id number]   MEDICAL RECORD NO.:  [id number]          PATIENT TYPE:  INP   LOCATION:  2314                         FACILITY:  [facility]   PHYSICIAN:  Charlcie Cradle. Delford Field, MD, FCCPDATE OF BIRTH:  1945-07-25   DATE OF CONSULTATION:  07/30/2006  DATE OF DISCHARGE:                                 CONSULTATION   PULMONARY CONSULTATION:   CHIEF COMPLAINT:  Acute pulmonary edema, hypoxic respiratory failure.   HISTORY OF PRESENT ILLNESS:  A 65 year old male admitted on July 28, 2006 because of increasing dyspnea, on abrupt at onset, brought to the  emergency room at 5 a.m., initially placed on BiPAP, found to be in  acute pulmonary edema.  He has past history hypertension only.  No  previous history of heart disease, chest pain, palpitations or other  symptom complexes.  He had no recent fever or cough.  He was fine until  he went to bed then acutely became worse.   PAST MEDICAL HISTORY:  Medical history of hypertension.   SURGERIES:  Tonsillectomy as a child.   MEDICATIONS PRIOR TO ADMISSION:  Multiple antihypertensives meds  including:  1. Lasix 20 mg daily.  2. Fosinopril 40 mg daily.  3. Doxazosin 4 mg daily.  4. Amlodipine 10 mg daily.   PAST HISTORY:  Includes that of:  1. Hypertension.  2. Benign prostatic hypertrophy.  3. NO ALLERGIES.  4. Recently known increased sugars.  5. A 3-cm infrarenal abdominal aortic aneurysm.   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  He is a life long, never smoker.  Does not drink.   FAMILY HISTORY:  Positive for coronary artery disease.   PHYSICAL EXAM:  GENERAL:  This is a well-developed, obese male in no  acute distress.  VITAL SIGNS:  Currently temp, T-max 100.  Blood pressure 150/94, pulse  86, respirations 18.  Saturation 94%.  CHEST:  Showed bilateral rales.  No wheeze or  rhonchi.  CARDIAC EXAM:  Showed a regular rate and rhythm with an S4 gallop.  No  S3.  No murmur.  ABDOMEN:  Was soft, nontender.  Liver slightly enlarged.  EXTREMITIES:  Showed no edema or clubbing.  SKIN:  Was clear.  NEUROLOGIC EXAM:  Was intact.  HEENT EXAM:  Mild jugular venous distention noted.  Oropharynx clear.  NECK:  Supple.   LABORATORY DATA:  Hemoglobin was 10.9, white count 11.6, sodium 134,  potassium 3.5, chloride 102, CO2 25, BUN 15, creatinine 0.94.  Chest x-  ray shows extensive airspace disease, but this is really pulmonary edema  essentially.  There is on the CT scan mild mediastinal lymphadenopathy.  On July 27, 2006, pH 7.40, pCO2 40, pO2 of 159.  BNP is 281 on  July 28, 2006.   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  He needs to have his ace inhibitor  increased, his beta blocker increased.  He needs to have his diuretics  increased.  He needs to have an arterial line and be in a more closely  monitored setting as he has an extremely high risk for sudden death and  worsening outcomes until he has his bypass surgery obtained.  He does  not have any primary pulmonary processes other than the acute pulmonary  edema secondary to the ischemic left ventricle.  Mediastinal  lymphadenopathy is a secondary phenomenon from central venous  congestion.  We will follow with you in these endeavors.      Charlcie Cradle Delford Field, MD, [facility]  Electronically Signed     PEW/MEDQ  D:  07/30/2006  T:  07/31/2006  Job:  [id number]   cc:   Darlin Priestly, MD  Kerin Perna, M.D.  Janae Bridgeman. Eloise Harman., M.D.

## 2010-11-02 NOTE — Op Note (Signed)
NAME:  Mitchell [name], Mitchell [name] NO.:  [id number]   MEDICAL RECORD NO.:  [id number]          PATIENT TYPE:  INP   LOCATION:  2314                         FACILITY:  [facility]   PHYSICIAN:  Sheldon Silvan, M.D.      DATE OF BIRTH:  Apr 01, 1946   DATE OF PROCEDURE:  08/01/2006  DATE OF DISCHARGE:                               OPERATIVE REPORT   PROCEDURE:  Intraoperative transesophageal echocardiography (TEE).   HISTORY:  Mr. Breton was brought to the operating room by Dr. Donata Clay  for coronary artery bypass grafting.  It was known from his 2-D echo  prior to the operation that he might have aortic regurgitation.  It was  felt that TEE would be helpful to follow him intraoperatively for both  diagnostic and monitoring purposes.  I questioned him preoperatively  about esophageal disorders including bleeding or history of other  disease processes.  He related none of these and understood that he  would have an ultrasound probe passed through his oropharynx into the  esophagus during the operation.   DESCRIPTION OF PROCEDURE:  After satisfactory induction of general  anesthesia, including endotracheal intubation, the Philips OmniPlane TEE  probe was sheathed and lubricated appropriately.  It was passed through  the oropharynx easily into the stomach.  It was withdrawn into the  esophagus and the heart was imaged.   The left ventricle was contracting well with a moderately decreased  ejection fraction of approximately 30-40%.  All the walls moved  appropriately.  There was no sign of pericardial effusion or thrombus.   The mitral valve was imaged.  The leaflets opposed normally.  There was  good movement of the chordae.  On color flow exam in the long axis there  was trace regurgitation centrally.   The left atrial appendage was seen and had no smoke or thrombus present.   The aortic valve was imaged and was tricuspid with very little sclerosis  on the cusp edges.  In the long  axis view, on color flow exam, there was  no stenosis noted but some central regurgitation.  The valve was further  interrogated with a deep gastric view and it was noted using continuous  wave pulsed Doppler that the slope of the regurgitant jet velocity into  the left ventricle was less than 3 indicating mild to moderate aortic  regurgitation.   The interatrial septum was examined using color flow technique and there  was no PFO noted.   The tricuspid valve was examined and moved normally.  On color flow exam  there was trace to 1+ regurgitation of this valve.   The patient was placed on cardiopulmonary bypass and grafting of the  coronary arteries was performed.   The patient was weaned from bypass and LV filling was monitored.  The  patient did well post-bypass and prior to transfer to the SICU the TEE  probe was removed without difficulty.      Sheldon Silvan, M.D.  Electronically Signed     DC/MEDQ  D:  08/01/2006  T:  08/02/2006  Job:  [id number]

## 2010-11-02 NOTE — Op Note (Signed)
NAME:  Mitchell [name], Mitchell [name] NO.:  [id number]   MEDICAL RECORD NO.:  [id number]          PATIENT TYPE:  INP   LOCATION:  2314                         FACILITY:  [facility]   PHYSICIAN:  Kerin Perna, M.D.  DATE OF BIRTH:  03-Sep-1945   DATE OF PROCEDURE:  07/31/2006  DATE OF DISCHARGE:                               OPERATIVE REPORT   OPERATIONS:  1. Coronary artery bypass grafting times 5 (left internal mammary      artery to LAD, sequential saphenous vein graft to posterior      descending and posterolateral, saphenous vein graft to diagonal,      saphenous vein graft to ramus intermediate).  2. Open lung biopsy, left upper lobe.   SURGEON:  Kerin Perna, M.D.   ASSISTANT:  Sheliah Plane, MD  Jerold Coombe, P.A.   PREOPERATIVE DIAGNOSES:  Severe 3-vessel coronary artery disease, with  class IV congestive heart failure and mild to moderate reduction in left  ventricular function.   POSTOPERATIVE DIAGNOSIS:  Severe 3-vessel coronary artery disease, with  class IV congestive heart failure and mild to moderate reduction in left  ventricular function.   ANESTHESIA:  General.   INDICATIONS:  The patient is a 65 year old male who presented to the  emergency department with acute onset of shortness of breath, and was  found to be in pulmonary edema with mildly elevated cardiac enzymes.  He  is treated with Lasix and nitroglycerin, and underwent cardiac  catheterization by Dr. Lenise Herald.  This demonstrated severe 3-  vessel coronary disease, with recent occlusion of a small circumflex  marginal.  His EF was 45%.  He was felt to be a candidate for surgical  revascularization.   I saw the patient in consultation and reviewed the results of the  cardiac catheterization with the patient and family.  I discussed the  indications and expected benefits of coronary artery bypass surgery, as  well as the alternatives to surgical therapy for treatment of his  severe  3-vessel coronary disease.  I reviewed the major aspects of the planned  procedure, including the choice of conduit to include endoscopically  harvested saphenous vein, the mammary artery, the location of the  surgical incisions, and the use of general anesthesia and  cardiopulmonary bypass.  I reviewed with the patient the risks to him of  this operation, including the risks of MI, CVA, bleeding, blood  transfusion requirement, infection and death.  He understood that he was  at increased risk for pulmonary complications, due to his pre-existing  pulmonary edema and acute lung injury pattern on chest x-ray.  After  reviewing this information, he demonstrated his understanding and agreed  to proceed with the operation as planned -- under what I felt was an  informed consent.  Prior to surgery I obtained a pulmonary medicine  consult, and it was the impression of the pulmonologist that the patient  had lung disease secondary to his ischemic heart disease and stiff left  ventricle and diastolic dysfunction.  Proceeding with surgery  expeditiously would be the best approach.   OPERATIVE FINDINGS:  The patient's coronaries were severely diseased.  The distal circumflex vessels were too small to graft.  The patient did  not require any blood cell transfusions.  A wedge lung biopsy of the  lingula was performed to rule out any intrinsic lung disease.  The vein  was harvested endoscopically from the right leg and was of good quality.   PROCEDURE:  The patient was brought to the operating room and placed  supine on the operating table, where general anesthesia was induced.  The chest, abdomen and legs were prepped with Betadine and draped as a  sterile field.  A sternal incision was made as the saphenous vein was  harvested endoscopically from the right leg.  The left internal mammary  artery was harvested as a pedicle graft from its origin at the  subclavian vessels.  Heparin was  administered and the sternal retractor  was placed.  The pericardium was opened and suspended.  The ACT was  documented as being therapeutic.  Pursestrings were placed in the  ascending aorta and the right atrium, and the patient was cannulated and  placed on bypass.  The coronaries were identified for grafting, and the  mammary artery and vein grafts were prepared for the distal anastomoses.  Cardioplegia catheters were placed for both antegrade aortic and  retrograde coronary sinus cardioplegia.  The patient was cooled to 32  degrees and the aortic crossclamp was applied.  Then 800 mL of cold  blood cardioplegia was delivered in split doses between the antegrade  aortic and retrograde coronary sinus catheters.  There was good  cardioplegic arrest and septal temperature dropped less than 15 degrees.   The distal coronary anastomoses were then performed.  The first distal  anastomosis was the sequential vein graft to the PD and PL.  The  posterior descending was a 1.0-1.7 mm vessel with proximal 70% stenosis,  and a side-to-side anastomosis where the vein was constructed using  running 7-0 Prolene.   The second distal anastomosis was a continuation of the sequential vein  graft to the posterolateral; this was a 1.5-mm vessel with proximal 80-  90% stenosis, and the end of the vein was sewn end-to-side with running  7-0 Prolene with good flow through the graft.  Cardioplegia was redosed.   The third distal anastomosis was to the ramus branch of the left  coronary.  This was a 1.2-mm vessel with proximal 80% stenosis.  A  reverse saphenous vein was small caliber and was sewn end-to-side with  running 7-0 Prolene; there was good flow through the graft.   The fourth distal anastomosis was to the diagonal branch to the LAD.  This was a 1.5-mm vessel with proximal 80% stenosis at its origin.  A  reverse saphenous vein was sewn end-to-side with running 7-0 Prolene. There was good flow  through the graft.  Cardioplegia was redosed.   The fifth distal anastomosis was of the distal third of LAD, where it  became epicardial in location.  More proximally it was deeply  intramyocardial.  The left IMA pedicle was brought through an opening  created in the left lateral pericardium.  It was brought down onto the  LAD and sewn end-to-side with running 8-0 Prolene.  There was excellent  flow through the anastomosis, after briefly releasing the pedicle  bulldog on the mammary artery.  The bulldog was reapplied and the  pedicle was secured to the epicardium.   Cardioplegia was redosed.  The three proximal vein anastomoses were  anastomosed to the ascending aorta, using a 4.0 mm punch with running 7-  0 Prolene.  Prior to tying down the final proximal anastomosis, air was  vented from the coronaries and the left side of the heart using a dose  of retrograde warm blood cardioplegia (hot shot) and the usual de-airing  maneuvers.  The final proximal anastomosis was tied and the crossclamp  was removed.   Air was aspirated from the vein grafts and each was opened and a good  flow.  The heart was cardioverted back to a regular rhythm.  The  cardioplegia cannulas were removed.  The patient was rewarmed to 37  degrees.   At this point the biopsy of the left upper lobe was performed using the  Endo-GIA surgical stapler, and performing a wedge resection of the left  upper lobe.  The staple line was hemostatic and intact.   Temporary pacing wires were applied.  The proximal and distal  anastomoses were checked and found to be hemostatic.  When the patient  had reached 37 degrees, the lungs were re-expanded and the ventilator  was resumed.  The patient was then weaned from bypass without difficulty  on low-dose dopamine and milrinone.  Cardiac output was 6 liters per  minute.  Protamine was administered without adverse reaction.  The  cannulas were removed.  The mediastinum was irrigated  with warm  antibiotic irrigation, and the leg incision was irrigated and closed in  a standard fashion.  The superior pericardial fat was closed over the  aorta and vein grafts.  Two mediastinal and a left pleural chest tube  were placed and brought out through separate incisions.  The sternum was  closed with interrupted steel wire.  The pectoralis fascia was closed  with a running #1 Vicryl.  The subcutaneous and skin layers were closed  in a running Vicryl and sterile dressings were applied.   TOTAL BYPASS TIME:  132 minutes.   CROSSCLAMP TIME:  85 minutes.      Kerin Perna, M.D.  Electronically Signed     PV/MEDQ  D:  07/31/2006  T:  07/31/2006  Job:  [id number]   cc:   Darlin Priestly, MD  [facility]

## 2010-11-02 NOTE — Assessment & Plan Note (Signed)
[facility]                             PULMONARY OFFICE NOTE   DUSTON, SMOLENSKI                     MRN:          [id number]  DATE:09/03/2006                            DOB:          07/02/1945    POST-HOSPITAL NOTE:  Mr. Mitchell [name] returns today in follow up and is a 65-  year-old male who was hospitalized in February for ischemic heart  disease.  He underwent emergent bypass surgery.  He had preoperative  syndrome, in which he had capillary rupture and acute lung injury.  He  successfully underwent his bypass surgery and was sent home on a  Combivent inhaler p.r.n.  He is still having occasional shortness of  breath that comes and goes, but denies any cough.  He is still on  oxygen.  He was in assisted living and no has been discharged to home.  He is short of breath with activity and at rest, but this is improving.  He had hemoptysis initially in February.  This is now resolved.  He  denies any heartburn, indigestion, loss of appetite, weight change,  abdominal pain, difficulty swallowing.  He is a life-long never smoker.   PAST MEDICAL HISTORY:  1. Hypertension.  2. Heart disease.  3. Bypass surgery as noted on July 31, 2006.  4. Diabetes.  5. Hypercholesterolemia.   MEDICATION ALLERGIES:  ERYTHROMYCIN.   CURRENT MEDICATIONS:  1. Multivitamin daily.  2. Aspirin 325 mg daily.  3. Simvastatin 20 mg daily.  4. Glipizide 10 mg daily.  5. Digitek 125 mg daily.  6. Lisinopril 20 mg daily.  7. Doxazosin 4 mg daily.  8. Combivent 2 sprays p.r.n.  9. Coreg 12.5 mg b.i.d.   PHYSICAL EXAMINATION:  VITAL SIGNS:  Ambulating on room air, 3 laps at  185 feet per lap.  The patient's saturation dropped no lower than 95%.  Blood pressure 170/100, pulse 71.  Saturation was 99% on 2 liters.  Temperature 97.9.  CHEST:  A few dry crackles at the bases; otherwise, were clear.  CARDIAC:  A regular rate and rhythm without S3.  Normal S1 and S2.  ABDOMEN:  Soft, nontender.  EXTREMITIES:  No edema, clubbing or venous disease.  SKIN:  Clear.  NEUROLOGIC:  Intact.  HEENT:  No jugular venous distention, no lymphadenopathy.  Oropharynx  clear.  NECK:  Supple.   CHEST X-RAY:  Chest x-ray was obtained and showed resolving bilateral  infiltrates.  Only minimal scarring seen at the bases that are  interstitial in nature.   IMPRESSION:  Resolved acute lung injury on the basis of fractured  capillary syndrome, status post acute myocardial infarction with stiff  heart ischemic syndrome, now resolved.   RECOMMENDATIONS:  Discontinue further oxygen and will use Combivent  p.r.n.  No further pulmonary follow up indicated.     Charlcie Cradle Delford Field, MD, [facility]  Electronically Signed    PEW/MedQ  DD: 09/04/2006  DT: 09/04/2006  Job #: [id number]   cc:   Kerin Perna, M.D.  Darlin Priestly, MD

## 2010-11-02 NOTE — H&P (Signed)
NAME:  Mitchell [name], Mitchell [name] NO.:  [id number]   MEDICAL RECORD NO.:  [id number]          PATIENT TYPE:  EMS   LOCATION:  ED                           FACILITY:  [facility]   PHYSICIAN:  Lenon Curt. Chilton Si, M.D.  DATE OF BIRTH:  Mar 13, 1946   DATE OF ADMISSION:  07/27/2006  DATE OF DISCHARGE:                              HISTORY & PHYSICAL   CHIEF COMPLAINT:  Shortness of breath.   HISTORY OF PRESENT ILLNESS:  65 year old white male under the primary  care of Dr. Dimas Alexandria had the abrupt onset of dyspnea starting  about 4 a.m. the day of admission.  He was in the emergency room by 5  a.m. and was put on BiPAP at that time as well as a nitroglycerin drip  for extreme hypertension.   He has a past medical history of hypertension, but he denies any prior  history of heart disease, recent chest pains, palpitations, prior  episodes of dyspnea, nausea, recent fever, or cough.  He says he felt  fine when he went to bed last night.  His dyspnea progressed rapidly  after the abrupt onset to the point that he could not talk.  He denies  any accompanying itching, welts, or lingual swelling.  He did have some  wheezing that he noted.   PAST MEDICAL HISTORY:  Limited to his hypertension.   PAST SURGICAL HISTORY:  Tonsillectomy as a child.   MEDICATIONS:  Unknown at this time.  The patient not able to recall.   ALLERGIES:  NONE KNOWN.   FAMILY HISTORY:  He has no local family and no children.  We could not  obtain any further history due to his difficulty speaking.   SOCIAL HISTORY:  He works as a Counsellor.  He lives alone.  He was  divorced 20 years ago.  He has an emergency contact friend, Lilyan Punt  at area [phone].   REVIEW OF SYSTEMS:  GENERAL:  His weight has been stable, and he has  been feeling well.  HEENT:  He wears prescription lenses.  He denies any  difficulty hearing.  No recent change in vision.  SKIN:  No complaints.  RESPIRATORY:  Wheezing today, as  under the history of present illness.  Generally, he is asymptomatic.  CARDIAC:  Negative for any significant  prior history.  No known cardiovascular problems.  GASTROINTESTINAL:  Denies reflux, liver disease, ulcer disease, prior history of jaundice,  abdominal pain.  GENITOURINARY:  Denies dysuria, frequency, history of  prostate problems.  MUSCULOSKELETAL:  Denies arthralgias, history of  arthritis, swollen joints, painful joints, or broken bones.  NEUROLOGIC:  Denies seizures or history of CVA.   PHYSICAL EXAMINATION:  VITAL SIGNS:  Temperature 98.6, pulse 140  initially and went down to 90 in the emergency room, blood pressure  initially 254/158 and declined to 111/77 on nitroglycerin drip.  GENERAL:  The patient is alert, oriented, and cooperative throughout the  examination.  He remains quite short of breath and is on BiPAP and is  finding it hard to speak.  SKIN:  No significant lesions.  HEENT:  Prescription lenses.  Sclerae white.  Conjunctivae clear.  Pupils are equal, round, and reactive.  Extraocular movements full.  Pinna, external auditory canals, tympanic membranes, and hearing all  normal.  Oropharynx unable to be seen well due to use of BiPAP.  LYMPH NODES:  No palpable cervical, axillary, inguinal, or other areas.  BREASTS:  Normal male.  LUNGS:  BiPAP in place.  There are a few basilar crackles.  Unable to  hear any wheezes at this time.  Moderately tachypneic even on BiPAP.  HEART:  Tachycardia without gallop, murmur, click, rub, heave, or  thrill.  ABDOMEN:  Moderate truncal obesity.  No organomegaly, mass, or  tenderness.  GENITALIA:  Normal male, bilaterally descended testicles.  RECTAL:  Deferred.  MUSCULOSKELETAL:  Grossly normal, moving all extremities, and no joint  deformities or effusions.  CIRCULATION:  1+ bipedal edema.  NEUROLOGIC:  Grossly normal.  Cranial nerves II-XII intact.  Moves all  extremities.  No focal muscular weakness or sensory  loss.   DIAGNOSTIC STUDIES:  X-ray:  Cardiomegaly and interstitial edema.   LABORATORY DATA:  CBC:  Hemoglobin 15.8, white count 14,500.  Myoglobin  92, CK-MB 4.3, troponin less than 0.05.  Complete metabolic panel:  Glucose 354.  BNP 312.   IMPRESSION AND PLAN:  1. Acute pulmonary edema.  We will rule out myocardial infarction.      Hope to get him off of BiPAP today.  2. Hypertension.  Will continue Lasix and start clonidine.  Currently      off his nitroglycerin drip.  3. Hyperglycemia.  New problem with no previous history of diabetes.      Will use sliding scale insulin and check capillary glucose four      times a day.      Lenon Curt Chilton Si, M.D.  Electronically Signed     AGG/MEDQ  D:  07/27/2006  T:  07/27/2006  Job:  [id number]   cc:   Janae Bridgeman. Eloise Harman., M.D.  Fax: [phone]

## 2010-11-02 NOTE — Consult Note (Signed)
NAME:  Mitchell [name], Mitchell [name] NO.:  [id number]   MEDICAL RECORD NO.:  [id number]          PATIENT TYPE:  INP   LOCATION:  3740                         FACILITY:  [facility]   PHYSICIAN:  Kerin Perna, M.D.  DATE OF BIRTH:  Nov 22, 1945   DATE OF CONSULTATION:  07/28/2006  DATE OF DISCHARGE:                                 CONSULTATION   REQUESTING PHYSICIAN:  Lenise Herald, M.D.   PRIMARY CARE PHYSICIAN:  Murray Hodgkins, M.D.   CONSULTANT:  Kerin Perna, M.D.   REASON FOR CONSULTATION:  Severe three-vessel coronary artery disease  with LV dysfunction and acute congestive failure.   CHIEF COMPLAINT:  cough and shortness of breath.   HISTORY OF PRESENT ILLNESS:  I was asked to evaluate this 65 year old  white male nonsmoker for potential surgical coronary revascularization  for recently diagnosed severe three-vessel coronary artery disease with  positive cardiac enzymes and reduced LV function.  The patient was in  his usual state of good health, working 2 jobs (paper route and Copy), when he felt nocturnal shortness of breath with cough  and presented to the [facility].  His saturation  was in the 60-70% range.  He was placed immediately on BiPAP ang given  Lasix and morphine for a blood pressure of 250/150.  His chest x-ray  showed an acute pulmonary edema pattern, and a follow-up CT scan showed  no evidence of pulmonary emboli, with dense airspace disease and mild  mediastinal adenopathy.  He was also found to have an infrarenal  saccular abdominal aortic aneurysm measuring approximately 3 cm.  Because the patient's cardiac enzymes were positive (troponin 1.7, MB 13  nanograms per mL).  He underwent cardiac cath today by Dr. Lenise Herald.  This demonstrated severe three-vessel coronary disease with  90% stenosis of the LAD diagonal, 80% stenosis of the right coronary and  PDPO bifurcation, 80-90% stenosis of the ramus and  90% stenosis of a  small circumflex marginal.  Ejection fraction was 30%, and left  ventricular end-diastolic pressure was 37 mmHg.  PA pressures were  55/38, and mixed venous saturation was 55%, cardiac output of 4 liters  per minute.  Because of his severe three-vessel coronary disease with  reduced LV function, he was felt to be a candidate for surgical  revascularization.   The patient's risk factors are significant for a positive family history  and probably undiagnosed diabetes with a hemoglobin A1c of 7.7.  He  denies smoking or hyperlipidemia.   PAST MEDICAL HISTORY:  1. Hypertension.  2. Benign prostatic hypertrophy.  3. No known drug allergies.  4. Newly diagnosed diabetes.  5. 3-cm infrarenal abdominal aortic aneurysm.   CURRENT MEDICATIONS:  1. Clonidine 0.1 p.o. b.i.d.  2. Amlodipine 10 mg daily.  3. Doxazosin 4 mg q.h.s.  4. Lasix 20 mg daily.  5. Motrin 200 mg b.i.d.  6. Multi-vitamin one a day.  7. Fosinopril 40 mg daily.   SOCIAL HISTORY:  The patient is unmarried without immediate family.  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  He has never smoked.  He has never used alcohol significantly.  His contact, girlfriend, is Lilyan Punt [phone].  His closest family  member is a niece.   FAMILY HISTORY:  Positive for coronary disease.  A parent has had heart  bypass surgery.  His mother also has abdominal aortic aneurysm.  One  uncle has had bypass surgery, and another sibling has had stroke, and  there is family history of diabetes as well.   REVIEW OF SYSTEMS:  CONSTITUTION:  Reviewed, is negative for fever and  weight loss.  ENT:  Review is negative for active dental problems or  difficulty  swallowing.  Thoracic review is positive for a recent fall  with bruised ribs on the left side, but no history of pneumothorax.  Cardiac review is positive for severe three-vessel coronary disease with  ischemic  cardiomyopathy and recent CHF and pulmonary edema.  GI review  is negative for hepatitis, jaundice or blood per rectum.  UROLOGIC:  Positive for BPH, negative for kidney stones or hematuria.  ENDOCRINE:  Review is positive for hyperglycemia with a hemoglobin A1c of 7.7,  negative for thyroid disease.  HEMATOLOGIC:  Review is negative for  bleeding disorder, positive for mild anemia on presentation with a  hematocrit of 34%.  His vascular review is negative for DVT,  claudication or TIA.  NEUROLOGIC:  Review is negative for stroke or  seizure.   PHYSICAL EXAMINATION:  VITAL SIGNS:  The patient 6 feet tall, weighs 205  pounds, blood pressure 140/70, pulse 72 and sinus, respirations 18,  saturation 93% on 4 liters.  GENERAL APPEARANCE:  That of a middle-aged male in this hospital room  following cardiac catheterization, in no acute distress.  HEENT:  Normocephalic.  Mentation good, pharynx clear.  NECK:  With mild JVD, negative for a mass or carotid bruits.  LYMPHATICS:  Show no palpable supraclavicular or axillary adenopathy.  THORAX:  Without deformity or tenderness.  Breath sounds are scattered,  coarse rales, greater at the bases.  CARDIAC:  Regular rhythm without S3 gallop or murmur.  ABDOMEN:  Soft without pulsatile mass or organomegaly or focal  tenderness.  EXTREMITIES:  Yield no cyanosis, clubbing or edema.  Peripheral pulses  are strong, 2+ in all extremities.  There is no venous insufficiency of  the lower extremities.  NEUROLOGIC:  Exam is alert and oriented without focal motor deficit.   LABORATORY DATA:  His chest x-ray, CT scan and coronary arteriograms are  reviewed.  I discussed the coronary arteriograms with Dr. Jenne Campus and  agree with the impression of severe three-vessel disease with moderate  reduction LV function and moderate-to-severe pulmonary hypertension.  There is no evidence of pulmonary emboli.  PLAN:  The patient will need surgical revascularization with  multiple  bypasses.  Prior to surgery, he will need a 2D echo.  He will need to  continue diuresis to improve his pulmonary status and acute lung injury  pattern on chest x-ray, prior to surgery.  I discussed the plan for  surgery, including the details of the procedure and expected recovery  and associated risks with the patient and his friends and family, and he  understands and agrees.  Surgery tentatively will be scheduled in  approximately 48 hours, if his pulmonary status continues to improve  (February 14th).  Thank you for the consultation.      Kerin Perna, M.D.  Electronically Signed     PV/MEDQ  D:  07/28/2006  T:  07/28/2006  Job:  [id number]

## 2010-11-02 NOTE — Discharge Summary (Signed)
NAME:  Mitchell [name], Mitchell [name] NO.:  [id number]   MEDICAL RECORD NO.:  [id number]          PATIENT TYPE:  INP   LOCATION:  2009                         FACILITY:  [facility]   PHYSICIAN:  Kerin Perna, M.D.  DATE OF BIRTH:  08/22/1945   DATE OF ADMISSION:  07/28/2006  DATE OF DISCHARGE:  08/06/2006                         DISCHARGE SUMMARY - REFERRING   PRIMARY DIAGNOSIS:  Severe three vessel coronary artery disease with  Class IV congestive heart failure and mild to moderate reduction in left  ventricular function.   IN-HOSPITAL DIAGNOSES:  1. Acute pulmonary edema, nonhypoxic respiratory failure.  2. Newly diagnosed diabetes mellitus, type 2.  3. Acute blood loss anemia postoperatively.  4. Volume overload postoperatively.  5. Lung biopsy shows focal fibrosis with minimal associated chronic      inflammation.  No malignancy identified.   SECONDARY DIAGNOSES:  1. Hypertension.  2. Benign prostatic hypertrophy.  3. Three centimeter infrarenal-abdominal aortic aneurysm.   IN-HOSPITAL OPERATIONS AND PROCEDURES:  1. Cardiac catheterization with right heart catheterization, left      heart catheterization, coronary angiography, abdominal aortogram.  2. Coronary artery bypass grafting x5 using left internal mammary      artery to left anterior descending, sequential saphenous vein graft      to posterior descending and posterolateral, saphenous vein graft to      diagonal branch, saphenous vein graft to ramus intermedius.  3. Open lung biopsy of left upper lobe.  4. Intraoperative transesophageal echocardiogram.   HISTORY AND PHYSICAL/HOSPITAL COURSE:  The patient is a 65 year old male  who presented to the emergency department with acute onset of shortness  of breath and was found to be in pulmonary edema with mildly elevated  cardiac enzymes.  He was treated with Lasix and nitroglycerin and  underwent cardiac catheterization by Dr. Jenne Campus.  This demonstrated  three  vessel coronary artery disease with recent occlusion of a small  circumflex marginal.  His ejection fraction was 45%.  At that time the  patient was felt to be a candidate for coronary artery bypass grafting.  The patient was seen and evaluated by Dr. Donata Clay.  Dr. Donata Clay  discussed with patient regarding coronary artery bypass grafting.  He  discussed the risks and benefits of the procedure with the patient.  The  patient acknowledged understanding.  Preoperatively the patient did have  bilateral carotid duplex ultrasound done showing on the right to have 40-  50% ICA stenosis with __________ on the left.  He had preop ABI's done  showing bilateral greater than 1.0.  The patient's surgery was scheduled  for July 31, 2006.  Preoperatively echocardiogram was done showing  no pericardial effusion.  Overall the left ventricular systolic function  was mildly decreased.  Left ventricular ejection fraction was estimated  at 40-45%.  The day prior to surgery the patient went into nonhypoxic  respiratory failure with sats dropping to 80%.  Pulmonary was consulted.  Chest x-ray done showed extensive air space disease with early pulmonary  edema centrally.  On CT scan there was mild mediastinal lymphadenopathy.  Pulmonary recommended increasing ACE inhibitor,  beta blocker and  diuretic.  They recommended patient undergo coronary artery bypass  grafting in the a.m. as scheduled.   The patient was taken to the operating room on July 31, 2006, where  he underwent coronary artery bypass grafting x5, using the  left  internal mammary artery to left anterior descending, sequential  saphenous vein graft to posterior descending and posterolateral  saphenous vein graft to diagonal branch, saphenous vein graft to ramus  intermedius.  He also underwent open lung biopsy of the left upper lobe.  The patient tolerated the procedure well and was transferred to the  intensive care unit in stable  condition.  Postoperatively the patient  was noted to be hemodynamically stable.  He remained intubated overnight  due to pulmonary. Status.  Chest x-ray showed increased diffuse  interstitial opacities.  On postoperative day #1 the patient was able to  be weaned and extubated.  The patient was noted to be alert and oriented  x4.  He was noted to have acute blood loss anemia with hemoglobin and  hematocrit of 8.0 and 22%.  He did receive one unit of packed red blood  cells.  Pulmonary did continue to follow the patient during his  postoperative course.  His surgical pathology came back showing a focal  fibrosis with minimal associated chronic inflammation.  There was no  malignancy identified.  Pulmonary continued to recommend aggressive  pulmonary treatment.  He was started on nebulizers and Avelox  postoperatively.  The patient continued on the postoperative course. He  was unfortunately unable to be weaned off oxygen prior to discharge  home.  He was satting greater than 90% on two liters of O2.  Plan was  for patient to continue on O2 at discharge. Postoperatively the patient  had a glucose __________  which showed to be elevated.  His sugars were  monitored closely and these were all elevated.  He was initially started  on Lantus insulin.  Creatinine was monitored during his postoperative  course and noted to be stable.  The patient was switched from Lantus  insulin to p.o. Glucotrol.  Blood sugar was continued to be monitored  and did stabilize.  The patient's hemoglobin and hematocrit were  monitored following that unit of transfusion.  They did remain stable.  He was initiated on p.o. iron.  The patient's hemoglobin and hematocrit  were stable prior to discharge; last one obtained showed 8.9 and 25.9%  which was postoperative day #4.  Postoperatively the patient was out of  bed and ambulating well.  He was transferred out to 2000 on postoperative day #5.  Vital signs were monitored  and noted to be  stable.  Medications were adjusted appropriately.  He remained in normal  sinus rhythm.  Pulmonary status was improved prior to discharge.  Incisions were clean, dry and intact and healing well.  The patient was  tolerating diet with no nausea or vomiting noted.   Last labs showed BMP on postop day #6 with sodium 138, potassium 4.0,  chloride 103, bicarb 28, BUN 18, creatinine 0.9, glucose 86.  CBC on  postoperative day #4 showed white count 7.6, hemoglobin 8.9, hematocrit  25.9, platelet count 246.   Care Management was consulted to help assist in discharge placement.  They have arranged for an assisting living facility at discharge.  The  patient was deemed to be ready for discharge to this facility on one to  two days.  Physical Therapy and Occupational Therapy recommended at  that  time.   FOLLOWUP:  A followup appointment will be arranged with Dr. Donata Clay  for one week.  Our office will contact the patient with this  information.  He will need to obtain a chest x-ray one hour prior to his  appointment.  The patient will need to follow up with Dr. Jenne Campus in two  weeks.  He will need to contact Dr. Jenne Campus and schedule this  appointment.  He will also need to follow up with Dr. Chilton Si in two to  four weeks to manage the patient's diabetes mellitus.   Also noted the patient has a 3 centimeter infrarenal abdominal aortic  aneurysm.  This will need to be monitored as an outpatient.   ACTIVITY:  As mentioned above, Physical Therapy and Occupational Therapy  have been arranged.  Please continue to ambulate three to four times a  day as tolerated.   DISCHARGE INSTRUCTIONS:  The patient was instructed on no driving for  two weeks, no lifting over 10 pounds.   INCISIONAL CARE:  The patient was advised he could shower, washing his  incision using soap and water.  He is to contact the office if he  develops any drainage or opening from any of his incision sites.    DIET:  The patient was instructed on low fat/low salt diet, as well as  carbohydrate modified 1800 calorie diet.   DISCHARGE MEDICATIONS:  1. Multivitamin daily.  2. Aspirin 325 mg daily.  3. Mucinex __________  50 mcg, two sprays each nostril daily.  4. Lopressor 25 mg b.i.d.  5. Doxazosin 4 mg at night.  6. Lisinopril 20 mg daily.  7. Zocor 20 mg at night.  8. Lasix 20 mg daily.  9. Potassium chloride 10 mEq daily.  10.Nu-Iron 150 mg daily.  11.Glucotrol-XL 10 mg daily.  12.Mucinex 600 mg every 12 hours as needed for congestion.  13.Percocet 5/325, 1-2 tabs p.o. 4-6 hrs p.r.n. pain.  14.Combivent inhaler 2 puffs every 6 hrs x2 weeks, then every 6 hrs as      needed.  15.Oxygen, 2 liters by nasal cannula.      Theda Belfast, [place]      Kerin Perna, M.D.  Electronically Signed    KMD/MEDQ  D:  08/06/2006  T:  08/06/2006  Job:  [id number]   cc:   Kerin Perna, M.D.

## 2010-11-02 NOTE — Cardiovascular Report (Signed)
NAME:  Mitchell [name], Mitchell [name] NO.:  [id number]   MEDICAL RECORD NO.:  [id number]          PATIENT TYPE:  INP   LOCATION:  3740                         FACILITY:  [facility]   PHYSICIAN:  Darlin Priestly, MD  DATE OF BIRTH:  [birth year]   DATE OF PROCEDURE:  DATE OF DISCHARGE:                            CARDIAC CATHETERIZATION   PROCEDURES:  1. Right heart catheterization.  2. Left heart catheterization.  3. Coronary angiography.  4. Abdominal aortogram.   ATTENDING:  Dr. Lenise Herald   COMPLICATIONS:  None.   INDICATION:  Mr. Feister is a 65 year old male patient of Dr. Higinio Plan with a history of hypertension who presented to [facility] ER  on the morning of July 27, 2006 at approximately 5:00 in the morning  with acute pulmonary edema.  He did rule in for a non-Q wave MI.  There  is no prior cardiac history.  He did have a CT scan of the chest to rule  out pulmonary embolism which was noted to have diffuse airspace disease  with bilateral hilar adenopathy.  He is now referred for catheterization  to rule out CAD as possible etiology.   DESCRIPTION OF OPERATION:  After obtaining informed consent, patient was  brought to the cardiac cath lab and the right and left groin were  shaved, prepped and draped in the usual sterile fashion.  Anesthesia  monitor established.  Using modified Seldinger technique, a #6 French  intraarterial sheath was inserted into the right femoral artery.  A 6-  [place] diagnostic catheter was used to perform diagnostic angiography.   Left main is a medium-size vessel with no evidence of disease.   The LAD is a medium-to-large-size vessel which course to give rise to 1  diagonal branch.  LAD is noted to have diffuse 80-to-90% proximal  disease as well as 80% mid-disease after takeoff of the first diagonal.   The first diagonal is a medium-size vessel which bifurcates distally  with a 90% proximal stenosis.   The left coronary  artery gives rise to a medium-size ramus intermedius  with sequential 99% ostial and proximal lesions.   The AV groove circumflex is a small-to-medium-size vessel which courses  to the AV groove and goes to 1 subtotal obtuse marginal.  The AV groove  circumflex has no evidence of disease.   The first OM is a small vessel which is subtotally occluded at the  proximal segment.  There is faint left-to-left collaterals to the OM.   The right coronary artery is a large vessel which is dominant and gives  rise to the PDA, as well as posterolateral branch.  There is a 30%  proximal and 80% long mid-RCA stenotic lesion.   PDA is a medium size vessel with no evidence of disease.   The PLA is a medium size vessel with a 50% mid-lesion as well a 90%  lesion in the proximal portion prior to bifurcation.   Abdominal aortogram reveals a moderate infrarenal aneurysm with mild 30%  left renal artery stenosis.   Left ventriculogram reveals an EF of approximately 25% with severe  global hypokinesis.   HEMODYNAMICS:  Right arterial pressure is 15, RV 61/12, PA 58/21,  pulmonary capillary wedge pressure of 33, systemic arterial pressure  120/72, LV systemic pressure 118/12, LVEDP of 34, cardiac output 4.5,  cardiac index 2.1, PA saturation 54%, O2 saturation 90% .   CONCLUSION:  1. Significant three-vessel coronary artery disease.  2. Severe left vessel systolic function.  3. Moderate infrarenal aneurysm.  4. Mild left renal artery stenosis.  5. Elevated pulmonary capillary wedge pressure with moderate pulmonary      hypertension.  6. Cardiac output 4.5, cardiac index 2.1.  7. Pulmonary artery saturation 54%, oxygen saturation 90%.      Darlin Priestly, MD  Electronically Signed     RHM/MEDQ  D:  07/28/2006  T:  07/29/2006  Job:  [id number]   cc:   Janae Bridgeman. Eloise Harman., M.D.

## 2010-11-03 LAB — CBC
HCT: 21.3 % — ABNORMAL LOW (ref 39.0–52.0)
MCHC: 34.3 g/dL (ref 30.0–36.0)
Platelets: 229 10*3/uL (ref 150–400)
RDW: 17.1 % — ABNORMAL HIGH (ref 11.5–15.5)
WBC: 9.1 10*3/uL (ref 4.0–10.5)

## 2010-11-03 LAB — GLUCOSE, CAPILLARY
Glucose-Capillary: 118 mg/dL — ABNORMAL HIGH (ref 70–99)
Glucose-Capillary: 168 mg/dL — ABNORMAL HIGH (ref 70–99)
Glucose-Capillary: 129 mg/dL — ABNORMAL HIGH (ref 70–99)

## 2010-11-04 LAB — GLUCOSE, CAPILLARY: Glucose-Capillary: 119 mg/dL — ABNORMAL HIGH (ref 70–99)

## 2010-11-05 LAB — CBC
Hemoglobin: 8.7 g/dL — ABNORMAL LOW (ref 13.0–17.0)
MCH: 32.2 pg (ref 26.0–34.0)
MCHC: 33.1 g/dL (ref 30.0–36.0)
MCV: 97.4 fL (ref 78.0–100.0)
RBC: 2.7 MIL/uL — ABNORMAL LOW (ref 4.22–5.81)

## 2010-11-05 LAB — GLUCOSE, CAPILLARY: Glucose-Capillary: 130 mg/dL — ABNORMAL HIGH (ref 70–99)

## 2010-11-06 LAB — GLUCOSE, CAPILLARY
Glucose-Capillary: 129 mg/dL — ABNORMAL HIGH (ref 70–99)
Glucose-Capillary: 178 mg/dL — ABNORMAL HIGH (ref 70–99)

## 2010-11-13 NOTE — Discharge Summary (Signed)
NAME:  Mitchell [name], Mitchell [name] NO.:  [id number]  MEDICAL RECORD NO.:  [id number]           PATIENT TYPE:  I  LOCATION:  5524                         FACILITY:  [facility]  PHYSICIAN:  Gabrielle Dare. Janee Morn, M.D.DATE OF BIRTH:  [birth year]  DATE OF ADMISSION:  10/29/2010 DATE OF DISCHARGE:  11/06/2010                        DISCHARGE SUMMARY - REFERRING   ADMISSION DIAGNOSES: 1. Status post hematoma evacuation, melanoma resection.  Status post     re-excision melanoma Oct 22, 2010 with postop bleed. 2. Adult-onset diabetes mellitus. 3. Hypertension. 4. History coronary artery disease, myocardial infarction, pulmonary     failure, and coronary artery bypass grafting in February 2008. 5. Abdominal aortic aneurysm. 6. Chronic obstructive pulmonary disease. 7. Carotid stenosis.  DISCHARGE DIAGNOSES: 1. Extensive bleed, status post melanoma excision Oct 22, 2010. 2. Severe anemia secondary to bleed. 3. Adult-onset diabetes mellitus. 4. Hypertension. 5. History of coronary artery disease, myocardial infarction and     coronary artery bypass graft in 07/2006. 6. Abdominal aortic aneurysm. 7. Chronic obstructive pulmonary disease. 8. Carotid stenosis.  PROCEDURES:  Wound exploration and evacuation of hematoma with 500-600 mL volume in the wound on Oct 29, 2010 by Dr. Johna Sheriff.  BRIEF HISTORY:  The patient is a 65 year old gentleman who underwent re- excision of a melanoma on Oct 22, 2010 with extensive flap coverage for the wound in the center of his back.  He had a sentinel node.  He had an known hematoma on Oct 25, 2010 which was removed prior to discharge.  He returned feeling weak and dizzy.  Hemoglobin on Oct 19, 2010 was 54, on Oct 28, 2010 it was down to 9.7 with a hematocrit of 27, by Oct 29, 2010 it was down to hemoglobin of 7 and hematocrit of 19.  He was seen and admitted from the ER by Dr. Freida Busman for further treatment as needed.  It was her opinion that the patient  probably need exploratory reevaluation of the flap and removal of the hematoma the following a.m.  PAST MEDICAL HISTORY:  As noted above.  ALLERGIES:  None known.  MEDICATIONS:  Colace, oxycodone, MiraLax, amlodipine, aspirin, benazepril, Coreg , fish oil, glipizide, hydrochlorothiazide, potassium, multivitamin, Plavix, lovastatin, salsalate, Spiriva, Tekturna, trazodone, and Viagra.  For further history and physicals, please see the dictated note.  HOSPITAL COURSE:  The patient was admitted as noted above.  His initial hemoglobin was 9.7 with a hematocrit of 27 on Oct 28, 2010 at the time of admission.  The following morning, hemoglobin was down to 7.3 with a hematocrit of 19.6.  The patient was seen by Dr. Johna Sheriff, and he subsequently took the patient to the OR.  He was found to have a large hematoma and large amount of blood was evacuated and Blake drains were placed in each side.  The patient was transferred to the floor.  He was transfused and has made slow and steady progress.  He has continued to have discomfort and a fair amount of drainage from his Blake drains, averaging around 60 mL in each drain per day currently.  PT and OT were asked to see the patient to help with  mobilization.  He was found to be extremely week, had difficulty with ambulation, and it was recommended that he be placed in a skilled nursing facility for rehab while he recovers his strength.  The patient agrees to this and arrangements have subsequently been made to place him at a skilled nursing facility.  He also has had some low blood pressures.  We have left parameters for his blood pressure medicines.  His Plavix which he was on at admission has been discontinued.  His wound is healing nicely.  There are still small amount of erythema along the stitches placed by Dr. Johna Sheriff and crusting.  This is being cleaned and showing good improvement.  At this point, we will plan to discharge the patient to  the skilled nursing facility.  He will need to have the incision cleaned with soap and water b.i.d. and redressed.  OT and PT are to be initiated.  The drains need to be emptied daily, and the the drainage from each, recorded and sent with the patient for his follow-up visit.  His hemoglobin and hematocrit have improved.  His last hemoglobin was 8.7 with a hematocrit of 26.3.  The patient been placed on multivitamin and iron.  He is taking a full diet.  His bowels are working normally.  He remained somewhat weak and requires OT and PT to help with his mobilization.  He is also having some discomfort which is ongoing especially at the site of the left drain.  It was Dr. Carollee Massed opinion that the patient was ready for discharge on Nov 06, 2010.  The nursing facility has been obtained that is the family and patient are in agreement with.  DISCHARGE MEDICATIONS:  New: 1. Ferrous sulfate 325 mg b.i.d. with meals. 2. Multivitamins with minerals one daily. 3. Oxycodone/APAP 5/325 1-2 q.4 h. p.r.n. Also recommended to stay on: 1. Senokot 1 tablet daily p.r.n. 2. Amlodipine 10 mg daily, hold for systolic blood pressure less than110. 3. Aspirin 81 mg daily. 4. Benazepril 40 mg daily. 5. Coreg 12.5 mg 2 tablets b.i.d. 6. CQ10 1 daily. 7. Colace one b.i.d. 8. Fish oil one daily. 9. Glipizide 5 mg half tablet b.i.d. 10.Hydrochlorothiazide 25 mg daily, hold for systolic blood pressure     less than 110. 11.KCl 20 mEq daily. 12.MiraLax 17 grams p.o. daily. 13.Crestor 20 mg p.o. daily at bedtime. 14.Spiriva 1 inhalation daily. 15.Tekturna 1 tablet 150 mg at bedtime. 16.Trazodone 100 mg at bedtime. The patient is to discontinue: 1. Plavix 75 mg daily. 2. Viagra 50 mg p.r.n.  DISCHARGE INSTRUCTIONS:  The patient will need labs including CBC and a CMP to be drawn before his follow-up visit.  Followup with Dr. Bertram Savin is scheduled for Nov 15, 2010 at 3:00 p.m.  He will need a CBC and a  CMP on 05/29 or 05/30 prior to seeing Dr. Freida Busman.  The patient will need orthostatic blood pressures at least once daily preferably in the afternoon.  If the patient has any orthostatic changes, please call the primary care for treatment.     Eber Hong, P.A.   ______________________________ Gabrielle Dare. Janee Morn, M.D.    WDJ/MEDQ  D:  11/06/2010  T:  11/06/2010  Job:  [id number]  cc:   Lennie Muckle, MD Marca Ancona, MD Dr. Valerie Salts  Electronically Signed by Sherrie George P.A. on 11/08/2010 10:56:07 AM Electronically Signed by Violeta Gelinas M.D. on 11/13/2010 01:16:29 PM

## 2010-12-03 NOTE — Discharge Summary (Signed)
  NAME:  Mitchell [name], Mitchell [name] NO.:  [id number]  MEDICAL RECORD NO.:  [id number]           PATIENT TYPE:  I  LOCATION:  5122                         FACILITY:  [facility]  PHYSICIAN:  Lennie Muckle, MD      DATE OF BIRTH:  [birth year]  DATE OF ADMISSION:  10/22/2010 DATE OF DISCHARGE:  10/25/2010                              DISCHARGE SUMMARY   FINAL DIAGNOSIS:  Malignant melanoma with re-excision of sentinel lymph node.  HOSPITAL COURSE:  Mr. Cummiskey is a 65 year old male who was admitted due to postop monitoring for bleeding from re-excision and large wound closure from the melanoma.  He had done well postoperatively.  He did develop a large hematoma seroma in the back area.  He has not had much complaints of discomfort.  I was able to evacuate approximately 150 mL of old bloody fluid today.  He still has some swelling, but I believe this is a residual hematoma within the wound bed.  I am going to have him follow up next Thursday to readdress this area.  He will call if he develops any fevers or chills.  I expect to likely drain from the wound since this continued to breakdown.  He is going to have home health to help him with his dressing changes, which need to be done daily.  I will call him when his final pathology is available.  CONDITION UPON DISCHARGE:  Good.  He will be discharged on all his home medications and then Percocet is added for pain control.  He is instructed to take stool softeners daily as well.     Lennie Muckle, MD     ALA/MEDQ  D:  10/25/2010  T:  10/26/2010  Job:  [id number]  cc:   Marca Ancona, MD Dr. Sharren Bridge  Electronically Signed by Bertram Savin MD on 12/03/2010 12:02:47 PM

## 2010-12-03 NOTE — Op Note (Signed)
NAME:  Mitchell [name], COLLEDGE NO.:  [id number]  MEDICAL RECORD NO.:  [id number]           PATIENT TYPE:  O  LOCATION:  5122                         FACILITY:  [facility]  PHYSICIAN:  Lennie Muckle, MD      DATE OF BIRTH:  [birth year]  DATE OF PROCEDURE:  10/22/2010 DATE OF DISCHARGE:                              OPERATIVE REPORT   PREOPERATIVE DIAGNOSIS:  Melanoma on the posterior thorax.  POSTOPERATIVE DIAGNOSIS:  Melanoma on the posterior thorax.  PROCEDURE:  Re-excision of melanoma with 2 cm margin seen in sentinel lymph node biopsy.  SURGEON:  Lennie Muckle, MD.  ASSISTANT:  OR staff.  ANESTHESIA:  General endotracheal anesthesia.  ESTIMATED BLOOD LOSS:  Minimal.  SPECIMENS:  Re-excision of the skin on the back, measuring 6 cm x 7 cm total size.  The sentinel lymph nodes were totaled 5 or 6.  These were sent for permanent pathology.  No immediate complications.  No drains were placed.  INDICATIONS FOR PROCEDURE:  Mitchell [name] is a 65 year old male who had had a melanoma removed, admitted by his primary care physician.  The melanoma was noted to be Clark level IV and Breslow thickness of 1.75. Due to the extension in the margin as well as the thickness, talking about doing a re-excision frequently margin and doing in the sentinel lymph node as well.  I noted a lymph node from previously preoperatively.  A split was done in left axilla.  An informed consent was obtained and he received IV vancomycin preoperatively.  DETAILS OF PROCEDURE:  Mitchell [name] was identified in the preoperative holding area.  I drew a circle around this area.  He was seen by Anesthesia and was taken to the operating suite.  Once in the operating room, he was placed in supine position.  Sequential compression devices were applied to his lower extremity.  He was then placed under general endotracheal anesthesia.  I injected 5 mL of methylene blue around the melanoma.  Skin was washed for 15  minutes.  He was then placed in prone position.  Left axilla was prepped and draped in usual sterile fashion. Surgical time-out was performed.  I began by placing incision in the axilla using the Neoprobe as a guide.  I divided the subcutaneous tissues by electrocautery.  It was identified the sentinel node which was blue.  It measured 7 and 29 by using the Neoprobe.  The second lymph node was only 12 that was blue in color.  #3 measured 51, #4 was 131. #5 and #6 were not blue, but were slightly enlarged.  All of these specimens were sent to pathology.  I irrigated the wound bed and found no evidence of bleeding.  I used the Neoprobe.  I could not really find anything of a significant value over 35.  I then irrigated the wound bed and reapproximated with a 3-0 Vicryl suture.  Dermis was closed with 0 Vicryl and skin was closed with 4-0 Monocryl.  I used 10 mL of 0.25% Marcaine for local block.  I placed Dermabond over the skin.  The patient was then placed on  his bed.  The patient was moved over to his bed and placed in a prone position inthe operating table.  Appropriate pressure points were padded.  We then prepped the posterior thorax incorporating the wound.  The wound was being draped in sterile fashion.  We are to perform a time-out regarding the first and second procedures.  We reiterated the procedure on the thorax.  I measured 10 cm only around previous excision site.  This created a defect of 6.7 cm.  I used a #15 blade to remove the specimen intact.  Epidermis and dermis all the way down to the fascia of the back was taken.  I used electrocautery and the specimen intact.  I placed a suture at 12 o'clock which was the cranial position.  I then placed a long suture at 3 o'clock which was at the right side of the patient. The specimen passed off the operative field.  I then drew appropriate lines for closure of the wound.  I measured approximately 10 cm cranially and caudally.  I  created flaps of the subcutaneous tissues and the fat using electrocautery.  This was incorporated well distance. This did seem rather tight and I then lengthened the incision to 5 cm cranially and caudally.  I kept creating a large flaps of the fatty tissues in order to have the tissue planes laid.  I then closed this in an S8 fashion using 2-0 Vicryl suture for the dermis.  There was some tension at the middle of the incision site.  I closed this with a 3-0 nylon and 2-0 nylon suture in an interrupted fashion.  Free the inferior and superior portions of the incision.  I was able to close this with a 3-0 Monocryl suture.  I then placed Steri-Strips at the cranial and caudal position.  Dry gauze was placed for final dressing.  I did inject approximately 40 mL of local anesthetic in the back side.  Total of 50 mL and 0.25% Marcaine were used for local anesthetic.  After the dressing was placed, the patient was rolled on to his bed, extubated, and transferred to postanesthesia care unit in stable condition.  He will be monitored tonight for pain issues and then discharged home tomorrow.     Lennie Muckle, MD     ALA/MEDQ  D:  10/22/2010  T:  10/23/2010  Job:  [id number]  Electronically Signed by Bertram Savin MD on 12/03/2010 12:03:00 PM

## 2010-12-03 NOTE — H&P (Signed)
NAME:  Mitchell [name], Mitchell [name] NO.:  [id number]  MEDICAL RECORD NO.:  [id number]           PATIENT TYPE:  E  LOCATION:  WLED                         FACILITY:  [facility]  PHYSICIAN:  Lennie Muckle, MD      DATE OF BIRTH:  1945/06/19  DATE OF ADMISSION:  10/28/2010 DATE OF DISCHARGE:                             HISTORY & PHYSICAL   CHIEF COMPLAINT:  Dizziness.  HISTORY:  Mitchell [name] is a 65 year old male who underwent re-excision of melanoma on May 7th with extensive flap coverage for the wound in the center of his back.  He also underwent to a sentinel lymph node.  He was discharged on May 10th, had a known hematoma on his posterior thorax.  I had removed portion of the seroma/hematoma prior to discharge and he was doing fine until today when he began to have increasing dizziness, near- syncopal episode.  Home health had been coming down to change his dressing daily.  They felt that the size of the hematoma had increased. He was going to call the office tomorrow, but began to feel more dizzy today.  He came into the emergency department.  He was found to have a hemoglobin of 9.7 and a large hematoma on the posterior thorax.  I was able to remove an extensive amount of clot in the emergency department and pack the wound with gauze.  PAST MEDICAL HISTORY: 1. Melanoma. 2. Diabetes. 3. Hypertension.  PAST SURGICAL HISTORY:  Excision of the melanoma and the sentinel lymph node biopsy.  ALLERGIES:  No drug allergies.  MEDICATIONS:  Colace, oxycodone, MiraLax, amlodipine, aspirin, benazepril, Coreg, fish oil, glipizide, hydrochlorothiazide, potassium chloride, multivitamin, Plavix, lovastatin, salsalate, Spiriva, Tekturna, trazodone, and Viagra.  REVIEW OF SYSTEMS:  Occasional shortness of breath.  PHYSICAL EXAMINATION:  GENERAL:  He is lying in a stretcher, appears pale, appears in no acute distress. VITAL SIGNS:  Temperature is 97.7; pulse 76; blood pressure  originally was 87/57, is 104/47 and it is somewhat orthostatic. HEENT:  Head is normocephalic.  The extraocular muscles are intact. Conjunctivae are pale.  Nares are clear without drainage.  Oral mucosa is moist.  Dentition is fair. NECK:  Without tenderness.  No swelling. CHEST:  Clear to auscultation bilaterally.  Normal expansion and excursion.  He is noted to be somewhat tachypneic. CARDIOVASCULAR:  Regular rate and rhythm. No murmurs, gallops, or rubs. Pulses palpated in upper extremities. EXTREMITIES:  No edema in lower extremities. SKIN:  There is a large hematoma on the posterior thorax, mild skin erythema, but no real frank cellulitis.  MUSCULOSKELETAL:  No deformities and no pain on palpation in the joints.  LABORATORY DATA:  Serum chemistries, BUN 22 and creatinine 1.4.  White count is 14, hemoglobin 27, and hematocrit 97.  ASSESSMENT/PLAN:  Status post melanoma excision with extensive flap closure, now with a hematoma.  Discharged him home on his Plavix and I suspect likely he had oozed into the wound bed.  I have discussed going to the operating room in the morning for wound VAC coverage of the area. I think he probably needs to have this washed out, I  was able to remove a large portion of the clot in the emergency department, but he was somewhat uncomfortable.  I do not think there is an extensive amount of bleeding, but if I can get the wound VAC on, it should be fine.  I will admit him to the floor and monitor his blood pressure.  He will likely need to have his dressing reinforced today.  Cover him with sliding scale insulin while he is n.p.o., restart all of his home medications tomorrow except for the Plavix and aspirin.     Lennie Muckle, MD     ALA/MEDQ  D:  10/28/2010  T:  10/29/2010  Job:  [id number]  cc:   Marca Ancona, MD [address] [place] [address]  __________  Electronically Signed by Bertram Savin MD on 12/03/2010 12:03:12 PM

## 2010-12-07 ENCOUNTER — Encounter (INDEPENDENT_AMBULATORY_CARE_PROVIDER_SITE_OTHER): Payer: Self-pay | Admitting: General Surgery

## 2010-12-18 ENCOUNTER — Encounter: Payer: Self-pay | Admitting: Cardiology

## 2010-12-21 ENCOUNTER — Encounter (INDEPENDENT_AMBULATORY_CARE_PROVIDER_SITE_OTHER): Payer: Self-pay | Admitting: Surgery

## 2010-12-24 ENCOUNTER — Encounter (INDEPENDENT_AMBULATORY_CARE_PROVIDER_SITE_OTHER): Payer: Self-pay | Admitting: Surgery

## 2010-12-24 ENCOUNTER — Ambulatory Visit (INDEPENDENT_AMBULATORY_CARE_PROVIDER_SITE_OTHER): Payer: Medicare Other | Admitting: Surgery

## 2010-12-24 DIAGNOSIS — IMO0002 Reserved for concepts with insufficient information to code with codable children: Secondary | ICD-10-CM

## 2010-12-24 DIAGNOSIS — C4359 Malignant melanoma of other part of trunk: Secondary | ICD-10-CM

## 2010-12-24 NOTE — Progress Notes (Signed)
Subjective:     Patient ID: Mitchell [name], male   DOB: 11/11/1945, 65 y.o.   MRN: [id number]    Temp 96 F (35.6 C)  Ht 6' (1.829 m)  Wt 199 lb (90.266 kg)  BMI 26.99 kg/m2    HPI  He is back today for another wound check. Again, he had wide excision of the melanoma on his back complicated by hematoma formation and then evacuation in the operating room. He is undergoing wound packing daily. He has no complaints other than occasional bleeding from the wound. Review of Systems     Objective:   Physical Exam    On exam, the wound is clean. There is a small opening with a large amount of packing tracking distally. There is no evidence of infection. Assessment:       Chronic back wound Plan:        Because of the large area underneath the skin and a small opening requiring a large amount of packing, I believe the best decision would be to open this further in the operating room so the wound could then be controlled with a wound VAC device. I have discussed this with the patient in detail. He understands the risks of bleeding and infection. His Plavix and aspirin we stopped 5 days preoperatively. I plan to keep him overnight. I believe this will allow the wound to close easier.

## 2010-12-27 ENCOUNTER — Encounter (HOSPITAL_COMMUNITY)
Admission: RE | Admit: 2010-12-27 | Discharge: 2010-12-27 | Disposition: A | Discharge status: Home / Self Care | Payer: Medicare Other | Source: Ambulatory Visit | Attending: Surgery | Admitting: Surgery

## 2010-12-27 LAB — CBC
Platelets: 191 10*3/uL (ref 150–400)
RBC: 3.98 MIL/uL — ABNORMAL LOW (ref 4.22–5.81)
WBC: 6.8 10*3/uL (ref 4.0–10.5)

## 2010-12-27 LAB — BASIC METABOLIC PANEL
CO2: 28 mEq/L (ref 19–32)
Calcium: 8.9 mg/dL (ref 8.4–10.5)
Chloride: 101 mEq/L (ref 96–112)
Potassium: 4.3 mEq/L (ref 3.5–5.1)
Sodium: 137 mEq/L (ref 135–145)

## 2010-12-27 LAB — SURGICAL PCR SCREEN
MRSA, PCR: NEGATIVE
Staphylococcus aureus: POSITIVE — AB

## 2010-12-31 ENCOUNTER — Ambulatory Visit (HOSPITAL_COMMUNITY)
Admission: RE | Admit: 2010-12-31 | Discharge: 2011-01-01 | Disposition: A | Discharge status: Home / Self Care | Payer: Medicare Other | Source: Ambulatory Visit | Attending: Surgery | Admitting: Surgery

## 2010-12-31 DIAGNOSIS — J8410 Pulmonary fibrosis, unspecified: Secondary | ICD-10-CM | POA: Insufficient documentation

## 2010-12-31 DIAGNOSIS — I714 Abdominal aortic aneurysm, without rupture, unspecified: Secondary | ICD-10-CM | POA: Insufficient documentation

## 2010-12-31 DIAGNOSIS — T8189XA Other complications of procedures, not elsewhere classified, initial encounter: Secondary | ICD-10-CM | POA: Insufficient documentation

## 2010-12-31 DIAGNOSIS — I6529 Occlusion and stenosis of unspecified carotid artery: Secondary | ICD-10-CM | POA: Insufficient documentation

## 2010-12-31 DIAGNOSIS — I5032 Chronic diastolic (congestive) heart failure: Secondary | ICD-10-CM | POA: Insufficient documentation

## 2010-12-31 DIAGNOSIS — Z85820 Personal history of malignant melanoma of skin: Secondary | ICD-10-CM | POA: Insufficient documentation

## 2010-12-31 DIAGNOSIS — I359 Nonrheumatic aortic valve disorder, unspecified: Secondary | ICD-10-CM | POA: Insufficient documentation

## 2010-12-31 DIAGNOSIS — E785 Hyperlipidemia, unspecified: Secondary | ICD-10-CM | POA: Insufficient documentation

## 2010-12-31 DIAGNOSIS — S21209A Unspecified open wound of unspecified back wall of thorax without penetration into thoracic cavity, initial encounter: Secondary | ICD-10-CM

## 2010-12-31 DIAGNOSIS — I7090 Unspecified atherosclerosis: Secondary | ICD-10-CM | POA: Insufficient documentation

## 2010-12-31 DIAGNOSIS — E119 Type 2 diabetes mellitus without complications: Secondary | ICD-10-CM | POA: Insufficient documentation

## 2010-12-31 DIAGNOSIS — I2510 Atherosclerotic heart disease of native coronary artery without angina pectoris: Secondary | ICD-10-CM | POA: Insufficient documentation

## 2010-12-31 DIAGNOSIS — I10 Essential (primary) hypertension: Secondary | ICD-10-CM | POA: Insufficient documentation

## 2010-12-31 DIAGNOSIS — Z01812 Encounter for preprocedural laboratory examination: Secondary | ICD-10-CM | POA: Insufficient documentation

## 2010-12-31 LAB — GLUCOSE, CAPILLARY
Glucose-Capillary: 115 mg/dL — ABNORMAL HIGH (ref 70–99)
Glucose-Capillary: 205 mg/dL — ABNORMAL HIGH (ref 70–99)

## 2011-01-01 LAB — GLUCOSE, CAPILLARY: Glucose-Capillary: 140 mg/dL — ABNORMAL HIGH (ref 70–99)

## 2011-01-02 NOTE — Discharge Summary (Signed)
  NAMEMarland [name]  GERRALD, BASU NO.:  [id number]  MEDICAL RECORD NO.:  [id number]  LOCATION:  5159                         FACILITY:  [facility]  PHYSICIAN:  Abigail Miyamoto, M.D. DATE OF BIRTH:  April 30, 1946  DATE OF ADMISSION:  12/31/2010 DATE OF DISCHARGE:                              DISCHARGE SUMMARY   DISCHARGE DIAGNOSIS:  Large back wound, status post wound vac placement.  OTHER PAST MEDICAL HISTORY:  Coronary artery disease, aortic insufficiency, atherosclerosis, hyperlipidemia, chronic diastolic heart failure, carotid artery stenosis, abdominal aortic aneurysm, pulmonary fibrosis, syncope and shortness of breath.  SUMMARY OF HISTORY:  This is a 65 year old gentleman who had wide excision of a very large melanoma on his back by Dr. Bertram Savin.  He had a postoperative hematoma and now has a chronic open wound for which he has been receiving wound packing.  Decision was made to open this wound better for a wound vac placement to control the drainage.  He has currently no other complaints.  HOSPITAL COURSE:  The patient was admitted, taken to the operating room where he underwent incision of his wound irrigation and placement of wound vacuum device.  He was taken to a regular surgical floor postoperatively.  On postop day 1, he was doing quite well.  His pain was well controlled.  Incision was made and discharged the patient to a nursing facility.  DISCHARGE DIET:  Regular.  DISCHARGE ACTIVITY:  As tolerated.  Home health and the nursing facility arranged for wound vac change which will be done every 3 to every 5 days.  I suspect the use will be anywhere from 1-3 months.  He will resume his home medications.  I have added cortisone cream for a rash in his axilla t.i.d. p.r.n.  He may shower during the wound vac change.  DISCHARGE FOLLOWUP:  He will need to follow up with Dr. Abigail Miyamoto at [facility] in approximately 2 weeks.     Abigail Miyamoto, M.D.     DB/MEDQ  D:  01/01/2011  T:  01/01/2011  Job:  [id number]  Electronically Signed by Abigail Miyamoto M.D. on 01/02/2011 10:19:02 AM

## 2011-01-02 NOTE — Op Note (Signed)
  NAME:  [name], [name] NO.:  [id number]  MEDICAL RECORD NO.:  [id number]  LOCATION:  SDSC                         FACILITY:  [facility]  PHYSICIAN:  Abigail Miyamoto, M.D. DATE OF BIRTH:  Feb 24, 1946  DATE OF PROCEDURE:  12/31/2010 DATE OF DISCHARGE:                              OPERATIVE REPORT   PREOPERATIVE DIAGNOSIS:  Chronic back wound.  POSTOPERATIVE DIAGNOSIS:  Chronic back wound.  PROCEDURE:  Incision and placement of wound VAC on chronic back wound.  SURGEON:  Abigail Miyamoto, MD  ANESTHESIA:  General and 0.25% Marcaine with epinephrine.  ESTIMATED BLOOD LOSS:  Minimal.  INDICATION:  Swett is a 65 year old gentleman who has undergone wide excision of melanoma on his back.  This was complicated by postoperative hematoma.  Now, he has a chronic back wound.  Decision was made to open up the area of undermined skin and place a wound VAC to assist with quicker closure of the wound.  PROCEDURE IN DETAIL:  The patient was brought to the operating room and identified as Mitchell [name].  He was placed supine on the operating table and general anesthesia was induced.  The patient was then turned to the prone position.  His back was then prepped and draped in the usual sterile fashion and an open wound on the mid back the tracked caudally.  I opened this up along the old incision with a #10 blade and took this down to the wound cavity with electrocautery.  The wound cavity itself was very clean with no evidence of purulence.  I then achieved hemostasis with cautery.  I irrigated the wound with saline.  I then anesthetized the area circumferentially with 0.25% Marcaine with epinephrine.  A small wound VAC size was brought onto the field.  I fashioned the sponge appropriately and placed down into the wound.  The secure tape was then placed over the top of the sponge  leaving sponge stick in place.  I then caught a small slit on the top of sponge and placed the  vacuum tubing on the top of this and secured it in place as well.  The VAC was then placed to suctioning.  A good seal appeared to be achieved.  The patient was then turned back into the supine position and was extubated in the operating room and taken in stable condition to the recovery room.     Abigail Miyamoto, M.D.    DB/MEDQ  D:  12/31/2010  T:  12/31/2010  Job:  [id number]  Electronically Signed by Abigail Miyamoto M.D. on 01/02/2011 10:19:00 AM

## 2011-01-09 ENCOUNTER — Encounter (INDEPENDENT_AMBULATORY_CARE_PROVIDER_SITE_OTHER): Payer: Medicare Other | Admitting: Surgery

## 2011-01-16 ENCOUNTER — Ambulatory Visit (INDEPENDENT_AMBULATORY_CARE_PROVIDER_SITE_OTHER): Payer: Medicare Other | Admitting: Surgery

## 2011-01-16 ENCOUNTER — Encounter (INDEPENDENT_AMBULATORY_CARE_PROVIDER_SITE_OTHER): Payer: Self-pay | Admitting: Surgery

## 2011-01-16 VITALS — Temp 96.8°F | Wt 200.0 lb

## 2011-01-16 DIAGNOSIS — Z09 Encounter for follow-up examination after completed treatment for conditions other than malignant neoplasm: Secondary | ICD-10-CM

## 2011-01-16 NOTE — Progress Notes (Signed)
Subjective:     Patient ID: Mitchell [name], male   DOB: [birth year], 65 y.o.   MRN: [id number]  HPI  He is here for his first postoperative check status post wound VAC placement of his large back wound. It is being changed 3 times a week. He has minimal discomfort. Review of Systems     Objective:   Physical Exam On exam, the wound is very clean with excellent granulation tissue and is contracting very well. He has done much better than expected    Assessment:     Patient with a healing surgical wound status post VAC placement    Plan:     We will continue the wound VAC and I will see him back in 2 weeks

## 2011-01-28 ENCOUNTER — Ambulatory Visit (INDEPENDENT_AMBULATORY_CARE_PROVIDER_SITE_OTHER): Payer: Medicare Other | Admitting: Surgery

## 2011-01-28 DIAGNOSIS — Z09 Encounter for follow-up examination after completed treatment for conditions other than malignant neoplasm: Secondary | ICD-10-CM

## 2011-01-28 NOTE — Progress Notes (Signed)
Subjective:     Patient ID: Mitchell [name], male   DOB: [birth year], 65 y.o.   MRN: [id number]  HPI He is here for another wound check. He still has the wound VAC on his back. He is doing well and has minimal complaints  Review of Systems     Objective:   Physical Exam On exam, the wound continues to contract very well. There is no evidence of infection. The wound did not track anywhere.    Assessment:     Nonhealing surgical wound    Plan:    We will continue the wound VAC. I will see him back in 2 weeks. Hopefully we will stop and start hydrogel.

## 2011-02-12 ENCOUNTER — Ambulatory Visit (INDEPENDENT_AMBULATORY_CARE_PROVIDER_SITE_OTHER): Payer: Medicare Other | Admitting: Surgery

## 2011-02-12 ENCOUNTER — Encounter (INDEPENDENT_AMBULATORY_CARE_PROVIDER_SITE_OTHER): Payer: Self-pay | Admitting: Surgery

## 2011-02-12 VITALS — BP 142/86 | HR 68 | Temp 98.1°F

## 2011-02-12 DIAGNOSIS — T8189XA Other complications of procedures, not elsewhere classified, initial encounter: Secondary | ICD-10-CM

## 2011-02-12 NOTE — Progress Notes (Signed)
Subjective:     Patient ID: Mitchell [name], male   DOB: 1945-11-03, 65 y.o.   MRN: [id number]  HPI He is here for another wound VAC check and wound check. He has no complaints. He will be leaving the nursing facility tomorrow  Review of Systems     Objective:   Physical Exam On exam, the wound remains very superficial with good granulation tissue. It is approximately 3 cm x 4 cm in size. It shows no evidence of infection    Assessment:    Nonhealing surgical wound     Plan:     He will continue the wound VAC device as an outpatient. I will see him back in 2 weeks.

## 2011-03-01 ENCOUNTER — Encounter (INDEPENDENT_AMBULATORY_CARE_PROVIDER_SITE_OTHER): Payer: Self-pay | Admitting: Surgery

## 2011-03-01 ENCOUNTER — Ambulatory Visit (INDEPENDENT_AMBULATORY_CARE_PROVIDER_SITE_OTHER): Payer: Medicare Other | Admitting: Surgery

## 2011-03-01 VITALS — BP 128/88 | HR 60

## 2011-03-01 DIAGNOSIS — T8189XA Other complications of procedures, not elsewhere classified, initial encounter: Secondary | ICD-10-CM

## 2011-03-01 NOTE — Progress Notes (Signed)
Subjective:     Patient ID: Mitchell [name], male   DOB: 1945-11-12, 65 y.o.   MRN: [id number]  HPI  He is here today for another wound check. He is home from the nursing facility. He is still receiving wound VAC care Review of Systems     Objective:   Physical Exam On examination, the wound is still superficial but actually looks larger. I had to treat with silver nitrate. There is no evidence of infection   Assessment:     Nonhealing surgical wound    Plan:     I'm going to stop the wound VAC and started on hydrogel wound gel. I renewed his Percocet. I will see him back in 2 weeks.

## 2011-03-06 ENCOUNTER — Telehealth: Payer: Self-pay | Admitting: Cardiology

## 2011-03-06 NOTE — Telephone Encounter (Signed)
I do not see an alternate number.

## 2011-03-06 NOTE — Telephone Encounter (Signed)
I tried [phone] and it is the wrong number.

## 2011-03-06 NOTE — Telephone Encounter (Signed)
Pt calling wanting to make sure RX for coreg for 25 mg written on 5/3 is still good. Pt went into hospital 5/7 and was d/c from nursing home 2 1/2 wks ago.  Pt wants to know if it is good to take to the [place] or if pt needs to come by and pick up a new one to take to [place]. Please return call to discuss further.

## 2011-03-12 ENCOUNTER — Telehealth (INDEPENDENT_AMBULATORY_CARE_PROVIDER_SITE_OTHER): Payer: Self-pay | Admitting: Surgery

## 2011-03-12 NOTE — Telephone Encounter (Signed)
Mitchell [name] called requesting a refill on his Oxycodone 5/325mg , he will be out of this medication tomorrow.

## 2011-03-13 NOTE — Telephone Encounter (Signed)
Ok to have someone in office write for percocet 5/325 #30 or call in vicodin #30 with 1 refill  Dr. Magnus Ivan

## 2011-03-19 ENCOUNTER — Ambulatory Visit (INDEPENDENT_AMBULATORY_CARE_PROVIDER_SITE_OTHER): Payer: Medicare Other | Admitting: Surgery

## 2011-03-19 ENCOUNTER — Encounter (INDEPENDENT_AMBULATORY_CARE_PROVIDER_SITE_OTHER): Payer: Self-pay | Admitting: Surgery

## 2011-03-19 VITALS — BP 132/86 | HR 68 | Temp 98.1°F | Resp 16 | Ht 72.0 in | Wt 208.2 lb

## 2011-03-19 DIAGNOSIS — T8189XA Other complications of procedures, not elsewhere classified, initial encounter: Secondary | ICD-10-CM

## 2011-03-19 NOTE — Progress Notes (Signed)
Subjective:     Patient ID: Mitchell [name], male   DOB: [birth year], 65 y.o.   MRN: [id number]  HPI  He is here for another wound check. He is having hydrogel placed on the wound 3 times a week. He is unable to change the dressings himself as he is alone and it is on his back. He complains of wound pain. Review of Systems     Objective:   Physical Exam    On exam, the wound is quite superficial. It is approximately 2 cm x 5 cm in size with excellent granulation tissue. I again treated with silver nitrate. Assessment:     Nonhealing surgical wound from what excision of melanoma of the back    Plan:     He will continue to use hydrogel wound care. Adjacent for Percocet to Vicodin. I will see him back in 3 weeks.

## 2011-03-28 ENCOUNTER — Other Ambulatory Visit (INDEPENDENT_AMBULATORY_CARE_PROVIDER_SITE_OTHER): Payer: Medicare Other | Admitting: *Deleted

## 2011-03-28 DIAGNOSIS — I25810 Atherosclerosis of coronary artery bypass graft(s) without angina pectoris: Secondary | ICD-10-CM

## 2011-03-28 DIAGNOSIS — I10 Essential (primary) hypertension: Secondary | ICD-10-CM

## 2011-03-28 LAB — HEPATIC FUNCTION PANEL
AST: 25 U/L (ref 0–37)
Total Bilirubin: 0.5 mg/dL (ref 0.3–1.2)

## 2011-03-28 LAB — LIPID PANEL
HDL: 38.8 mg/dL — ABNORMAL LOW (ref >39.00)
LDL Cholesterol: 49 mg/dL (ref 0–99)
Total CHOL/HDL Ratio: 3

## 2011-04-03 ENCOUNTER — Encounter (INDEPENDENT_AMBULATORY_CARE_PROVIDER_SITE_OTHER): Payer: Self-pay | Admitting: Surgery

## 2011-04-09 ENCOUNTER — Encounter (INDEPENDENT_AMBULATORY_CARE_PROVIDER_SITE_OTHER): Payer: Self-pay | Admitting: Surgery

## 2011-04-09 ENCOUNTER — Ambulatory Visit (INDEPENDENT_AMBULATORY_CARE_PROVIDER_SITE_OTHER): Payer: Medicare Other | Admitting: Surgery

## 2011-04-09 ENCOUNTER — Other Ambulatory Visit: Payer: Self-pay | Admitting: Dermatology

## 2011-04-09 VITALS — BP 146/88 | HR 72 | Temp 97.8°F | Resp 12 | Ht 72.0 in | Wt 217.0 lb

## 2011-04-09 DIAGNOSIS — T8189XA Other complications of procedures, not elsewhere classified, initial encounter: Secondary | ICD-10-CM

## 2011-04-09 NOTE — Progress Notes (Signed)
Subjective:     Patient ID: Mitchell [name], male   DOB: Jul 07, 1945, 65 y.o.   MRN: [id number]  HPI He is here for another recheck of his nonhealing surgical wound on the back status post wide excision of the melanoma and then reexcision of a chronic wound. He still treats the wound with hydrogel. He has no complaints today  Review of Systems     Objective:   Physical Exam On exam, the wound continues to contract. Is now down to 1.5 cm x 3 cm. I again treated with silver nitrate. There was no evidence of infection    Assessment:     Nonhealing surgical wound    Plan:     We will continue to undergo wound care with home health. I will see him back in one month

## 2011-04-16 ENCOUNTER — Encounter: Payer: Self-pay | Admitting: Cardiology

## 2011-04-16 ENCOUNTER — Ambulatory Visit (INDEPENDENT_AMBULATORY_CARE_PROVIDER_SITE_OTHER): Payer: Medicare Other | Admitting: Cardiology

## 2011-04-16 VITALS — BP 154/78 | HR 55 | Ht 72.0 in | Wt 216.0 lb

## 2011-04-16 DIAGNOSIS — I5032 Chronic diastolic (congestive) heart failure: Secondary | ICD-10-CM

## 2011-04-16 DIAGNOSIS — I714 Abdominal aortic aneurysm, without rupture: Secondary | ICD-10-CM

## 2011-04-16 DIAGNOSIS — E785 Hyperlipidemia, unspecified: Secondary | ICD-10-CM

## 2011-04-16 DIAGNOSIS — I359 Nonrheumatic aortic valve disorder, unspecified: Secondary | ICD-10-CM

## 2011-04-16 DIAGNOSIS — I10 Essential (primary) hypertension: Secondary | ICD-10-CM

## 2011-04-16 DIAGNOSIS — I6529 Occlusion and stenosis of unspecified carotid artery: Secondary | ICD-10-CM

## 2011-04-16 DIAGNOSIS — I2510 Atherosclerotic heart disease of native coronary artery without angina pectoris: Secondary | ICD-10-CM

## 2011-04-16 LAB — CBC WITH DIFFERENTIAL/PLATELET
Basophils Absolute: 0 10*3/uL (ref 0.0–0.1)
Eosinophils Absolute: 0.2 10*3/uL (ref 0.0–0.7)
HCT: 40.1 % (ref 39.0–52.0)
Hemoglobin: 13.7 g/dL (ref 13.0–17.0)
Lymphs Abs: 2.2 10*3/uL (ref 0.7–4.0)
MCHC: 34.1 g/dL (ref 30.0–36.0)
MCV: 99.7 fl (ref 78.0–100.0)
Monocytes Absolute: 0.7 10*3/uL (ref 0.1–1.0)
Neutro Abs: 3.1 10*3/uL (ref 1.4–7.7)
RDW: 14.1 % (ref 11.5–14.6)

## 2011-04-16 LAB — BASIC METABOLIC PANEL
CO2: 32 mEq/L (ref 19–32)
Calcium: 9.1 mg/dL (ref 8.4–10.5)
Chloride: 102 mEq/L (ref 96–112)
Glucose, Bld: 105 mg/dL — ABNORMAL HIGH (ref 70–99)
Sodium: 140 mEq/L (ref 135–145)

## 2011-04-16 NOTE — Patient Instructions (Signed)
Your physician wants you to follow-up in: April 2013 You will receive a reminder letter in the mail two months in advance. If you don't receive a letter, please call our office to schedule the follow-up appointment.  Your physician has requested that you have an abdominal aorta duplex. During this test, an ultrasound is used to evaluate the aorta. Allow 30 minutes for this exam. Do not eat after midnight the day before and avoid carbonated beverages . To be done in March 2013  Your physician has requested that you have a carotid duplex. This test is an ultrasound of the carotid arteries in your neck. It looks at blood flow through these arteries that supply the brain with blood. Allow one hour for this exam. There are no restrictions or special instructions. To be done in March 2013.

## 2011-04-17 NOTE — Assessment & Plan Note (Signed)
Only mild on last echo.  May be improved due to better BP control.

## 2011-04-17 NOTE — Assessment & Plan Note (Signed)
Stable, repeat abdominal US in 3/13.

## 2011-04-17 NOTE — Assessment & Plan Note (Signed)
Stable moderate carotid stenosis.  Repeat carotid dopplers in 3/13.

## 2011-04-17 NOTE — Assessment & Plan Note (Signed)
Stable with no ischemic symptoms.  Continue ASA, Plavix, statin, Coreg, ACEI.

## 2011-04-17 NOTE — Progress Notes (Signed)
PCP: Dr. Jeannetta Nap  65 yo with h/o CAD s/p CABG, aortic aneurysm, HTN, DM2 returns to cardiology clinic.  Since last appointment, patient had a large melanoma resected from his back.  He required a skin flap and had prolonged issues with healing.  He had a wound vac for a while and had to spend a number of weeks at a SNF.  He is home now and is doing fine.  The surgical site is healing.  He denies chest pain.  Stable dyspnea with fast walking or climbing up a flight of steps.  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   ECG:  NSR, normal  Labs (8/10): K 3.8, creatinine 1.12, LDL 61, HDL 29 Labs (3/11): HDL 32, LDL 55, LFTs normal Labs (96/04): LDL 55, HDL 27, K 3.9, creatinine 1.0 Labs (4/12): LDL 53, HDL 27 Labs (10/12): LDL 49, HDl 39  Allergies (verified):  No Known Drug Allergies  Past Medical History: 1. Coronary artery disease.  The patient had non-ST-elevation MI in February 2008 and there was an 80-90% proximal LAD stenosis, 90%proximal first diagonal stenosis, 99% ostial ramus stenosis.  The first obtuse marginal was subtotally occluded and there was an 80% mid RCA stenosis.  EF was estimated 25% on ventriculogram, coronary artery bypass grafting was done with a LIMA to the LAD, sequential saphenous vein graft to the PDA, and PLOM, saphenous graft to the diagonal, saphenous vein graft to the ramus. 2. Aortic insufficiency: Echo (3/11): EF 55-60%, moderate diastolic dysfunction, moderate aortic insufficiency, moderate LAE.  Echo (3/12) with EF 50-55%, grade II diastolic dysfunction, mild MR, mild aortic insufficiency.  3. Infrarenal AAA. Abdominal US 3/11 with AAA 3.9 x 3.9 cm.  Abdominal US 3/12 with AAA 3.8 x 3.9 cm 4. Hypertension. 5. Type 2 diabetes. 6. History of torn left rotator cuff. 7. History of obstructive/restrictive PFTs.  There is some reversibility with bronchodilator.  The patient actually never smoked.  He is on Spiriva. 8. Holter (3/10):  Frequent PACs and blocked PACs.   PVCs with trigeminy.  No long pauses.  Slowest HR in 40s while asleep.  9. Carotid stenosis.  Carotid dopplers (3/12) with stable 40-59% bilateral ICA stenosis.  10.  Melanoma: s/p excision from back 5/12.   Family History: Both the patient's mother and father had abdominal aortic aneurysm and both died due to complications with attempted AAA repair.  Social History: The patient is a lifetime nonsmoker. He is unemployed but draws a pension from the [place].  He lives by himself.  He has no close family left, he only has cousins. He plays the piano in church.    Current Outpatient Prescriptions  Medication Sig Dispense Refill  . amLODipine (NORVASC) 10 MG tablet Take 10 mg by mouth daily.        Marland Kitchen aspirin 81 MG tablet Take 81 mg by mouth daily.        . benazepril (LOTENSIN) 40 MG tablet Take 40 mg by mouth daily.        . carvedilol (COREG) 25 MG tablet Take 1 tablet (25 mg total) by mouth 2 (two) times daily.      . Coenzyme Q10 (CO Q 10) 100 MG CAPS Take by mouth daily.        . ferrous sulfate 325 (65 FE) MG tablet Take 325 mg by mouth daily with breakfast.        . fish oil-omega-3 fatty acids 1000 MG capsule Take 2 g by mouth daily.        Marland Kitchen  glipiZIDE (GLUCOTROL) 5 MG 24 hr tablet Take 5 mg by mouth daily.       . hydrochlorothiazide 25 MG tablet Take 25 mg by mouth daily.        Marland Kitchen HYDROcodone-acetaminophen (NORCO) 5-325 MG per tablet Take 1 tablet by mouth every 4 (four) hours as needed.        . Multiple Vitamin (MULTIVITAMIN) tablet Take 1 tablet by mouth daily.        Marland Kitchen PLAVIX 75 MG tablet TAKE ONE TABLET BY MOUTH EVERY DAY  30 each  6  . potassium chloride SA (K-DUR,KLOR-CON) 20 MEQ tablet Take 20 mEq by mouth daily.        . rosuvastatin (CRESTOR) 40 MG tablet Take 20 mg by mouth daily.        . salsalate (DISALCID) 500 MG tablet Take 500 mg by mouth 3 (three) times daily as needed.        . sildenafil (VIAGRA) 50 MG tablet DO NOT USE NITROGYLCERIN WITHIN 24 HOURS OF TAKING VIAGRA   10 tablet  1  . TEKTURNA 150 MG tablet TAKE ONE TABLET BY MOUTH EVERY DAY  30 each  6  . tiotropium (SPIRIVA) 18 MCG inhalation capsule Place 18 mcg into inhaler and inhale daily.        . traZODone (DESYREL) 100 MG tablet Take 100 mg by mouth at bedtime.       Marland Kitchen oxyCODONE-acetaminophen (PERCOCET) 5-325 MG per tablet Take 2 tablets by mouth every 4 (four) hours as needed.          BP 154/78  Pulse 55  Ht 6' (1.829 m)  Wt 216 lb (97.977 kg)  BMI 29.29 kg/m2 General:  Well developed, well nourished, in no acute distress. Neck:  Neck supple, no JVD. No masses, thyromegaly or abnormal cervical nodes. Lungs:  Clear bilaterally to auscultation. Heart:  Non-displaced PMI, chest non-tender; regular rate and rhythm, S1, S2 without murmurs, rubs. +S4. Carotid upstroke normal, no bruit. Pedals normal pulses. Trace bilateral ankle edema Abdomen:  Bowel sounds positive; abdomen soft and non-tender without masses, organomegaly, or hernias noted. No hepatosplenomegaly. Extremities:  No clubbing or cyanosis. Neurologic:  Alert and oriented x 3. Psych:  Normal affect.

## 2011-04-17 NOTE — Assessment & Plan Note (Signed)
Stable chronic exertional dyspnea.  Not volume overloaded on exam.

## 2011-04-17 NOTE — Assessment & Plan Note (Signed)
BP is high today but has been in the 120s systolic when checked by his home health RN (three times a week).  No change in therapy today.

## 2011-04-17 NOTE — Assessment & Plan Note (Signed)
Lipids at goal when last checked (< 70).

## 2011-04-23 ENCOUNTER — Telehealth (INDEPENDENT_AMBULATORY_CARE_PROVIDER_SITE_OTHER): Payer: Self-pay

## 2011-04-23 NOTE — Telephone Encounter (Signed)
Patient called and said he had requested a refill from his pharmacy yesterday on the Hydrocodone 5/325 from [facility] [phone].  I didn't see any sign of the request so I called and had them refax it.  I spoke to Dr Donell Beers since Dr Magnus Ivan is off and got the ok for  Hydrocodone 5/325 one to two tablets by mouth every 6 hours as needed #30 no refills.  I called this in to the [facility].  The pt was notified.

## 2011-04-24 ENCOUNTER — Other Ambulatory Visit: Payer: Self-pay | Admitting: Dermatology

## 2011-04-30 ENCOUNTER — Other Ambulatory Visit (INDEPENDENT_AMBULATORY_CARE_PROVIDER_SITE_OTHER): Payer: Self-pay | Admitting: General Surgery

## 2011-05-02 ENCOUNTER — Telehealth (INDEPENDENT_AMBULATORY_CARE_PROVIDER_SITE_OTHER): Payer: Self-pay

## 2011-05-02 NOTE — Telephone Encounter (Signed)
Called pt to notify him that his Norco 5/325 #30 had been called in to [facility] approved by Dr. Abigail Miyamoto.

## 2011-05-07 ENCOUNTER — Encounter (INDEPENDENT_AMBULATORY_CARE_PROVIDER_SITE_OTHER): Payer: Self-pay | Admitting: Surgery

## 2011-05-07 ENCOUNTER — Ambulatory Visit (INDEPENDENT_AMBULATORY_CARE_PROVIDER_SITE_OTHER): Payer: Medicare Other | Admitting: Surgery

## 2011-05-07 VITALS — BP 164/98 | HR 72 | Temp 98.6°F | Resp 18 | Ht 72.0 in | Wt 218.8 lb

## 2011-05-07 DIAGNOSIS — T8189XA Other complications of procedures, not elsewhere classified, initial encounter: Secondary | ICD-10-CM

## 2011-05-07 NOTE — Progress Notes (Signed)
Subjective:     Patient ID: Mitchell [name], male   DOB: 20-Mar-1946, 65 y.o.   MRN: [id number]  HPI He is here for one last wound check. He still has chronic pain in the wound. Home health is still doing dressing changes for him.  Review of Systems     Objective:   Physical Exam    On exam, the wound is now completely closed except for one tiny area which I treated with silver nitrate. Assessment:   Patient with nonhealing surgical wound that is finally healed      Plan:     I will see him back as needed. He will continue to be seen by his dermatologist.

## 2011-05-08 ENCOUNTER — Other Ambulatory Visit: Payer: Self-pay | Admitting: Dermatology

## 2011-05-19 ENCOUNTER — Other Ambulatory Visit (INDEPENDENT_AMBULATORY_CARE_PROVIDER_SITE_OTHER): Payer: Self-pay | Admitting: Surgery

## 2011-05-21 NOTE — Telephone Encounter (Signed)
Please review Rx.

## 2011-09-02 ENCOUNTER — Encounter (INDEPENDENT_AMBULATORY_CARE_PROVIDER_SITE_OTHER): Payer: Medicare Other

## 2011-09-02 DIAGNOSIS — I700 Atherosclerosis of aorta: Secondary | ICD-10-CM

## 2011-09-02 DIAGNOSIS — I714 Abdominal aortic aneurysm, without rupture: Secondary | ICD-10-CM

## 2011-09-04 ENCOUNTER — Telehealth: Payer: Self-pay | Admitting: Cardiology

## 2011-09-04 NOTE — Telephone Encounter (Signed)
Pt was notified and pv has him in a recall for 6 months.

## 2011-09-04 NOTE — Telephone Encounter (Signed)
FU Call: Pt returning call from our office. Please return pt call to discuss further.  

## 2011-09-06 ENCOUNTER — Telehealth: Payer: Self-pay | Admitting: *Deleted

## 2011-09-06 NOTE — Telephone Encounter (Signed)
Pt left VM states he was instructed to make f/u appt w/ Dr. Gaylyn Rong by his Dermatologist for hx of Melanoma.  Called pt back and left him VM requesting he ask dermatologist to send his records, including pathology reports, etc, as pt has not been seen here in almost one year and not since he had surgery for melanoma.

## 2011-09-09 ENCOUNTER — Telehealth: Payer: Self-pay | Admitting: Oncology

## 2011-09-09 ENCOUNTER — Encounter (INDEPENDENT_AMBULATORY_CARE_PROVIDER_SITE_OTHER): Payer: Medicare Other

## 2011-09-09 DIAGNOSIS — I6529 Occlusion and stenosis of unspecified carotid artery: Secondary | ICD-10-CM

## 2011-09-09 NOTE — Telephone Encounter (Signed)
pt called  lmovm to r/s missed appt in may2012. rtn call scheduled appt for 04/04 and for pt to rtn call to confirm appt d/t

## 2011-09-09 NOTE — Telephone Encounter (Signed)
pt rtn call and confirmed appt for 04/04

## 2011-09-18 ENCOUNTER — Other Ambulatory Visit: Payer: Self-pay | Admitting: Cardiology

## 2011-09-19 ENCOUNTER — Encounter: Payer: Self-pay | Admitting: Oncology

## 2011-09-19 ENCOUNTER — Ambulatory Visit (HOSPITAL_BASED_OUTPATIENT_CLINIC_OR_DEPARTMENT_OTHER): Payer: Medicare Other | Admitting: Oncology

## 2011-09-19 ENCOUNTER — Ambulatory Visit (HOSPITAL_BASED_OUTPATIENT_CLINIC_OR_DEPARTMENT_OTHER): Payer: Medicare Other | Admitting: Lab

## 2011-09-19 ENCOUNTER — Telehealth: Payer: Self-pay | Admitting: Oncology

## 2011-09-19 VITALS — BP 148/84 | HR 68 | Temp 98.1°F | Ht 72.0 in | Wt 224.4 lb

## 2011-09-19 DIAGNOSIS — J449 Chronic obstructive pulmonary disease, unspecified: Secondary | ICD-10-CM

## 2011-09-19 DIAGNOSIS — E119 Type 2 diabetes mellitus without complications: Secondary | ICD-10-CM

## 2011-09-19 DIAGNOSIS — C439 Malignant melanoma of skin, unspecified: Secondary | ICD-10-CM

## 2011-09-19 DIAGNOSIS — M2560 Stiffness of unspecified joint, not elsewhere classified: Secondary | ICD-10-CM

## 2011-09-19 DIAGNOSIS — R5381 Other malaise: Secondary | ICD-10-CM

## 2011-09-19 DIAGNOSIS — R5383 Other fatigue: Secondary | ICD-10-CM

## 2011-09-19 HISTORY — DX: Stiffness of unspecified joint, not elsewhere classified: M25.60

## 2011-09-19 LAB — COMPREHENSIVE METABOLIC PANEL
AST: 21 U/L (ref 0–37)
Albumin: 3.9 g/dL (ref 3.5–5.2)
Alkaline Phosphatase: 44 U/L (ref 39–117)
BUN: 12 mg/dL (ref 6–23)
Potassium: 3.9 mEq/L (ref 3.5–5.3)
Sodium: 140 mEq/L (ref 135–145)
Total Bilirubin: 0.3 mg/dL (ref 0.3–1.2)
Total Protein: 8.4 g/dL — ABNORMAL HIGH (ref 6.0–8.3)

## 2011-09-19 LAB — CBC WITH DIFFERENTIAL/PLATELET
BASO%: 0.4 % (ref 0.0–2.0)
EOS%: 2.4 % (ref 0.0–7.0)
MCH: 33.6 pg — ABNORMAL HIGH (ref 27.2–33.4)
MCHC: 34.5 g/dL (ref 32.0–36.0)
MCV: 97.4 fL (ref 79.3–98.0)
MONO%: 10.8 % (ref 0.0–14.0)
RDW: 13 % (ref 11.0–14.6)
lymph#: 2 10*3/uL (ref 0.9–3.3)

## 2011-09-19 NOTE — Telephone Encounter (Signed)
appts made and printed for pt aom °

## 2011-09-19 NOTE — Patient Instructions (Signed)
1.  History of melanoma.  No evidence of disease recurrence at this time.  Continue watchful observation.  2.  Follow up:  With [place] every 6 months.  Please make sure that you also have follow up with your dermatologist.

## 2011-09-19 NOTE — Progress Notes (Signed)
[facility] OFFICE PROGRESS NOTE  Cc:  Kaleen Mask, MD, MD  DIAGNOSIS:  History of  pT2a N0 M0 malignant melanoma, Clark level IV, Breslow thickness 1.75 mm with mitotic figures 20/mm sq without ulceration, without lymphatic invasion, without perineural invasion, without microscopic satellitosis.     PAST THERAPY:   Sentinel node biopsy and wide excision on 10/22/2010 without residual cancer or positive nodes.   CURRENT THERAPY:  Watchful observation.   INTERVAL HISTORY: Mitchell [name] 66 y.o. male returns for regular followup. I had a chance to review the extensive medical record since his last visit with me. He underwent wide excision last year with healing issues of the back lesion. Since then he has completely healed without any residual open wound. He follows with Dr. Yetta Barre his dermatologist every 4-6 months.  He has numerous seborrhoic keratosis  spots on the skin. However there is one dark lesion in the left axial calf that is being watched by his dermatologist.  He has mild fatigue counts; however, he is independent of all activities daily living. He has mild DOE and wheezing for which he uses his inhalers for COPD.  Patient denies headache, visual changes, confusion, drenching night sweats, palpable lymph node swelling, mucositis, odynophagia, dysphagia, nausea vomiting, jaundice, chest pain, palpitation, productive cough, gum bleeding, epistaxis, hematemesis, hemoptysis, abdominal pain, abdominal swelling, early satiety, melena, hematochezia, hematuria, skin rash, spontaneous bleeding, joint swelling, joint pain, heat or cold intolerance, bowel bladder incontinence, back pain, focal motor weakness, paresthesia, depression, suicidal or homocidal ideation, feeling hopelessness.   Past Medical History  Diagnosis Date  . CAD (coronary artery disease)     patient had non-ST-elevation MI in February 2008 and there was an 80-90% proximal LAD stenosis, 90% proximal first  diagonal stenosis, 99% ostial ramus stenosis. The first obtuse marginal was subtotally occluded and there was an 80% mid RCA stenosis. EF was estimated 25% on ventriculogram, coronary artery bypass grafting was done with a LIMA to the LAD, sequential saphenous vein graft to the PDA,  . Aortic insufficiency   . AAA (abdominal aortic aneurysm)     ultrasound done in 3/11 and showed AAA 3.9 x 3.9cm  . HTN (hypertension)   . DM2 (diabetes mellitus, type 2)   . Carotid stenosis   . Torn rotator cuff     hx of. left   . History of PFTs     obstructive/restrictive. There is some reversibility with bronchodilator. The patient actually never smoked. He is on Spiriva  . Cancer     melanoma  . Altered taste   . Pain     back  . COPD (chronic obstructive pulmonary disease)   . Melanoma 09/2010    pT2a N0 M0; clark level IV.   Marland Kitchen Range of motion deficit 09/19/2011    Past Surgical History  Procedure Date  . Tonsillectomy 1951  . Arterial bypass surgry   . Melanoma excision   . Blood clots removed   . Application of wound vac     Current Outpatient Prescriptions  Medication Sig Dispense Refill  . acetaminophen (TYLENOL) 325 MG tablet Take 650 mg by mouth every 6 (six) hours as needed.      Marland Kitchen amitriptyline (ELAVIL) 10 MG tablet Take 20 mg by mouth at bedtime.      Marland Kitchen amLODipine (NORVASC) 10 MG tablet Take 10 mg by mouth daily.        Marland Kitchen aspirin 81 MG tablet Take 81 mg by mouth daily.        Marland Kitchen  benazepril (LOTENSIN) 40 MG tablet Take 40 mg by mouth daily.        . carvedilol (COREG) 25 MG tablet Take 1 tablet (25 mg total) by mouth 2 (two) times daily.      . Coenzyme Q10 (CO Q 10) 100 MG CAPS Take by mouth daily.        . fish oil-omega-3 fatty acids 1000 MG capsule Take 2 g by mouth daily.        Marland Kitchen glipiZIDE (GLUCOTROL) 5 MG 24 hr tablet Take 5 mg by mouth daily.       . hydrochlorothiazide 25 MG tablet Take 25 mg by mouth daily.        . imiquimod (ALDARA) 5 % cream Apply topically 3 (three)  times a week.      Marland Kitchen ketoconazole (NIZORAL) 2 % cream       . Multiple Vitamin (MULTIVITAMIN) tablet Take 1 tablet by mouth daily.        Marland Kitchen PLAVIX 75 MG tablet TAKE ONE TABLET BY MOUTH EVERY DAY  30 each  6  . potassium chloride SA (K-DUR,KLOR-CON) 20 MEQ tablet Take 20 mEq by mouth daily.        . rosuvastatin (CRESTOR) 40 MG tablet Take 20 mg by mouth daily.        . salsalate (DISALCID) 500 MG tablet Take 500 mg by mouth 3 (three) times daily as needed.        . sildenafil (VIAGRA) 50 MG tablet DO NOT USE NITROGYLCERIN WITHIN 24 HOURS OF TAKING VIAGRA  10 tablet  1  . TEKTURNA 150 MG tablet TAKE ONE TABLET BY MOUTH EVERY DAY  30 each  6  . tiotropium (SPIRIVA) 18 MCG inhalation capsule Place 18 mcg into inhaler and inhale daily.        . traMADol (ULTRAM) 50 MG tablet TAKE ONE TO TWO TABLETS BY MOUTH EVERY 4 HOURS AS NEEDED  30 tablet  1  . traZODone (DESYREL) 100 MG tablet Take 100 mg by mouth at bedtime.       . ferrous sulfate 325 (65 FE) MG tablet Take 325 mg by mouth daily with breakfast.        . HYDROcodone-acetaminophen (NORCO) 5-325 MG per tablet Take 1 tablet by mouth every 4 (four) hours as needed.        Marland Kitchen oxyCODONE-acetaminophen (PERCOCET) 5-325 MG per tablet Take 2 tablets by mouth every 4 (four) hours as needed.         ALLERGIES:   has no known allergies.  REVIEW OF SYSTEMS:  The rest of the 14-point review of system was negative.   Filed Vitals:   09/19/11 1326  BP: 148/84  Pulse: 68  Temp: 98.1 F (36.7 C)   Wt Readings from Last 3 Encounters:  09/19/11 224 lb 6.4 oz (101.787 kg)  05/07/11 218 lb 12.8 oz (99.247 kg)  04/16/11 216 lb (97.977 kg)   ECOG Performance status: 1  PHYSICAL EXAMINATION:   General:  well-nourished in no acute distress.  Eyes:  no scleral icterus.  ENT:  There were no oropharyngeal lesions.  Neck was without thyromegaly.  Lymphatics:  Negative cervical, supraclavicular or axillary adenopathy.  Respiratory: lungs were clear bilaterally  without wheezing or crackles.  Cardiovascular:  Regular rate and rhythm, S1/S2, without murmur, rub or gallop.  There was no pedal edema.  GI:  abdomen was soft, flat, nontender, nondistended, without organomegaly.  Muscoloskeletal:  no spinal tenderness of palpation of vertebral spine.  Skin exam was without echymosis, petichae.  Complete skin exam showed numerous seborrheic keratosis lesions on his back and chest.  There were two small dark lesions in the posterior calves.  Each measured about 3mm.  The one in the left calf had a dark center without ulceration.   Neuro exam was nonfocal.  Patient was able to get on and off exam table without assistance.  Gait was normal.  Patient was alerted and oriented.  Attention was good.   Language was appropriate.  Mood was normal without depression.  Speech was not pressured.  Thought content was not tangential.      LABORATORY/RADIOLOGY DATA:  Lab Results  Component Value Date   WBC 8.0 09/19/2011   HGB 14.3 09/19/2011   HCT 41.6 09/19/2011   PLT 182 09/19/2011   GLUCOSE 154* 09/19/2011   CHOL 113 03/28/2011   TRIG 128.0 03/28/2011   HDL 38.80* 03/28/2011   LDLCALC 49 03/28/2011   ALKPHOS 44 09/19/2011   ALT 19 09/19/2011   AST 21 09/19/2011   NA 140 09/19/2011   K 3.9 09/19/2011   CL 99 09/19/2011   CREATININE 1.01 09/19/2011   BUN 12 09/19/2011   CO2 32 09/19/2011   PSA 2.61 09/16/2007   INR 1.36 10/29/2010   MICROALBUR 0.55 07/17/2009    ASSESSMENT AND PLAN:   1. Diabetes mellitus, type II:  He is on glipizide per PCP. 2. Hyperlipidemia.  He is on rosuvastatin per PCP. 3. Hypertension, well controlled on benazepril, carvedilol, hydrochlorothiazide per PCP. 4. History of coronary artery disease.  He is on aspirin, Plavix, beta blocker, simvastatin, ACE inhibitor per cardiologist. 5. Chronic obstructive pulmonary disease.  On inhalers p.r.n.  6. History of melanoma:  On clinical history, physical exam, lab tests, there is no evidence of disease recurrence or  metastases. However I advised him to have close follow up Dr. Yetta Barre with the 2 lesions on his calves to ensure that he isn't developing new melanoma.  He already has appointments with Dr. Yetta Barre in the near future.  There is no indication per NCCN  guideline to obtain routine imaging as he does not have any concerning symptoms to warrant surveillance CT scan. 7. Followup: in about 6 months given new lesions on his calves.     The length of time of the face-to-face encounter was 15 minutes. More than 50% of time was spent counseling and coordination of care.

## 2011-10-01 ENCOUNTER — Telehealth: Payer: Self-pay | Admitting: Oncology

## 2011-10-01 NOTE — Telephone Encounter (Signed)
l/m for out pt rehab to call for pt and ot     aom

## 2011-10-03 ENCOUNTER — Ambulatory Visit (INDEPENDENT_AMBULATORY_CARE_PROVIDER_SITE_OTHER): Payer: Medicare Other | Admitting: Cardiology

## 2011-10-03 ENCOUNTER — Encounter: Payer: Self-pay | Admitting: Cardiology

## 2011-10-03 VITALS — BP 154/82 | HR 58 | Ht 72.0 in | Wt 221.0 lb

## 2011-10-03 DIAGNOSIS — I6529 Occlusion and stenosis of unspecified carotid artery: Secondary | ICD-10-CM

## 2011-10-03 DIAGNOSIS — I714 Abdominal aortic aneurysm, without rupture: Secondary | ICD-10-CM

## 2011-10-03 DIAGNOSIS — I10 Essential (primary) hypertension: Secondary | ICD-10-CM

## 2011-10-03 DIAGNOSIS — I25810 Atherosclerosis of coronary artery bypass graft(s) without angina pectoris: Secondary | ICD-10-CM

## 2011-10-03 DIAGNOSIS — E785 Hyperlipidemia, unspecified: Secondary | ICD-10-CM

## 2011-10-03 DIAGNOSIS — R0602 Shortness of breath: Secondary | ICD-10-CM

## 2011-10-03 MED ORDER — CHLORTHALIDONE 25 MG PO TABS: 25.0000 mg | ORAL_TABLET | Freq: Every day | ORAL | Status: DC

## 2011-10-03 MED ORDER — POTASSIUM CHLORIDE CRYS ER 20 MEQ PO TBCR: 20.0000 meq | EXTENDED_RELEASE_TABLET | Freq: Two times a day (BID) | ORAL | Status: DC

## 2011-10-03 NOTE — Patient Instructions (Signed)
Stop HCTZ(hydrochlorothiazide).  Start chlorthalidone 25mg  daily.  Increase KCL(potassium) to 20 mEq two times a day.  Your physician recommends that you return for a FASTING lipid profile BMET in 1 week.  Your physician has requested that you have a renal artery duplex. During this test, an ultrasound is used to evaluate blood flow to the kidneys. Allow one hour for this exam. Do not eat after midnight the day before and avoid carbonated beverages. Take your medications as you usually do. In the next week or so.    Your physician has requested that you have an abdominal aorta duplex. During this test, an ultrasound is used to evaluate the aorta. Allow 30 minutes for this exam. Do not eat after midnight the day before and avoid carbonated beverages. September 2013   Dr Shirlee Latch has referred you to Dr Myra Gianotti.  Your physician wants you to follow-up in: 6 months with Dr Shirlee Latch. (October 2013). You will receive a reminder letter in the mail two months in advance. If you don't receive a letter, please call our office to schedule the follow-up appointment.

## 2011-10-04 NOTE — Progress Notes (Signed)
PCP: Dr. Jeannetta Nap  66 yo with h/o CAD s/p CABG, aortic aneurysm, HTN, DM2 returns to cardiology clinic.  He denies chest pain.  Stable dyspnea with fast walking or climbing up a flight of steps or a hill.  BP is still running high despite multiple medications.  Of note, he had an abdominal ultrasound with significant increase in the last year in the maximal diameter of the aneurysm from 3.9 cm => 4.6 cm.    ECG:  NSR, T wave flattening in AVL  Labs (8/10): K 3.8, creatinine 1.12, LDL 61, HDL 29 Labs (3/11): HDL 32, LDL 55, LFTs normal Labs (19/14): LDL 55, HDL 27, K 3.9, creatinine 1.0 Labs (4/12): LDL 53, HDL 27 Labs (10/12): LDL 49, HDL 39  Allergies (verified):  No Known Drug Allergies  Past Medical History: 1. Coronary artery disease.  The patient had non-ST-elevation MI in February 2008 and there was an 80-90% proximal LAD stenosis, 90%proximal first diagonal stenosis, 99% ostial ramus stenosis.  The first obtuse marginal was subtotally occluded and there was an 80% mid RCA stenosis.  EF was estimated 25% on ventriculogram, coronary artery bypass grafting was done with a LIMA to the LAD, sequential saphenous vein graft to the PDA, and PLOM, saphenous graft to the diagonal, saphenous vein graft to the ramus. 2. Aortic insufficiency: Echo (3/11): EF 55-60%, moderate diastolic dysfunction, moderate aortic insufficiency, moderate LAE.  Echo (3/12) with EF 50-55%, grade II diastolic dysfunction, mild MR, mild aortic insufficiency.  3. Infrarenal AAA. Abdominal US 3/11 with AAA 3.9 x 3.9 cm.  Abdominal US 3/12 with AAA 3.8 x 3.9 cm.  Abdominal US (3/13) with AAA 4.6 x 3.9 with complex plaque within the AAA.  4. Hypertension. 5. Type 2 diabetes. 6. History of torn left rotator cuff. 7. History of obstructive/restrictive PFTs.  There is some reversibility with bronchodilator.  The patient actually never smoked.  He is on Spiriva. 8. Holter (3/10):  Frequent PACs and blocked PACs.  PVCs with  trigeminy.  No long pauses.  Slowest HR in 40s while asleep.  9. Carotid stenosis.  Carotid dopplers (3/12) with stable 40-59% bilateral ICA stenosis.  Carotid dopplers (3/13) with 40-59% bilateral stenosis.  10.  Melanoma: s/p excision from back 5/12.   Family History: Both the patient's mother and father had abdominal aortic aneurysm and both died due to complications with attempted AAA repair.  Social History: The patient is a lifetime nonsmoker. He is unemployed but draws a pension from the [place].  He lives by himself.  He has no close family left, he only has cousins. He plays the piano in church.   ROS: All systems reviewed and negative except as per HPI.    Current Outpatient Prescriptions  Medication Sig Dispense Refill  . acetaminophen (TYLENOL) 325 MG tablet Take 650 mg by mouth every 6 (six) hours as needed.      Marland Kitchen amitriptyline (ELAVIL) 10 MG tablet Take 20 mg by mouth at bedtime.      Marland Kitchen amLODipine (NORVASC) 10 MG tablet Take 10 mg by mouth daily.        Marland Kitchen amoxicillin (AMOXIL) 500 MG tablet Take 500 mg by mouth 2 (two) times daily.      Marland Kitchen aspirin 81 MG tablet Take 81 mg by mouth daily.        . benazepril (LOTENSIN) 40 MG tablet Take 40 mg by mouth daily.        . carvedilol (COREG) 25 MG tablet Take 1 tablet (  25 mg total) by mouth 2 (two) times daily.      . Coenzyme Q10 (CO Q 10) 100 MG CAPS Take by mouth daily.        . ferrous sulfate 325 (65 FE) MG tablet Take 325 mg by mouth daily with breakfast.        . fish oil-omega-3 fatty acids 1000 MG capsule Take 2 g by mouth daily.        Marland Kitchen glipiZIDE (GLUCOTROL) 5 MG 24 hr tablet Take 5 mg by mouth daily.       Marland Kitchen HYDROcodone-acetaminophen (NORCO) 5-325 MG per tablet Take 1 tablet by mouth every 4 (four) hours as needed.        . imiquimod (ALDARA) 5 % cream Apply topically 3 (three) times a week.      Marland Kitchen ketoconazole (NIZORAL) 2 % cream       . Multiple Vitamin (MULTIVITAMIN) tablet Take 1 tablet by mouth daily.        Marland Kitchen  oxyCODONE-acetaminophen (PERCOCET) 5-325 MG per tablet Take 2 tablets by mouth every 4 (four) hours as needed.       Marland Kitchen PLAVIX 75 MG tablet TAKE ONE TABLET BY MOUTH EVERY DAY  30 each  6  . rosuvastatin (CRESTOR) 40 MG tablet Take 20 mg by mouth daily.        . salsalate (DISALCID) 500 MG tablet Take 500 mg by mouth 3 (three) times daily as needed.        . sildenafil (VIAGRA) 50 MG tablet DO NOT USE NITROGYLCERIN WITHIN 24 HOURS OF TAKING VIAGRA  10 tablet  1  . TEKTURNA 150 MG tablet TAKE ONE TABLET BY MOUTH EVERY DAY  30 each  6  . tiotropium (SPIRIVA) 18 MCG inhalation capsule Place 18 mcg into inhaler and inhale daily.        . traMADol (ULTRAM) 50 MG tablet TAKE ONE TO TWO TABLETS BY MOUTH EVERY 4 HOURS AS NEEDED  30 tablet  1  . traZODone (DESYREL) 100 MG tablet Take 100 mg by mouth at bedtime.       . chlorthalidone (HYGROTON) 25 MG tablet Take 1 tablet (25 mg total) by mouth daily.  90 tablet  3  . potassium chloride SA (K-DUR,KLOR-CON) 20 MEQ tablet Take 1 tablet (20 mEq total) by mouth 2 (two) times daily.  180 tablet  3     BP 154/82  Pulse 58  Ht 6' (1.829 m)  Wt 221 lb (100.245 kg)  BMI 29.97 kg/m2 General:  Well developed, well nourished, in no acute distress. Neck:  Neck supple, no JVD. No masses, thyromegaly or abnormal cervical nodes. Lungs:  Clear bilaterally to auscultation. Heart:  Non-displaced PMI, chest non-tender; regular rate and rhythm, S1, S2 without murmurs, rubs. +S4. Carotid upstroke normal, no bruit. Pedals normal pulses. Trace bilateral ankle edema Abdomen:  Bowel sounds positive; abdomen soft and non-tender without masses, organomegaly, or hernias noted. No hepatosplenomegaly. Extremities:  No clubbing or cyanosis. Neurologic:  Alert and oriented x 3. Psych:  Normal affect.

## 2011-10-04 NOTE — Assessment & Plan Note (Signed)
Stable chronic exertional dyspnea.  Not volume overloaded on exam.   

## 2011-10-04 NOTE — Assessment & Plan Note (Signed)
Goal LDL < 70.  Due for repeat cholesterol.

## 2011-10-04 NOTE — Assessment & Plan Note (Signed)
Stable moderate carotid stenosis.  Repeat study in 3/14.

## 2011-10-04 NOTE — Assessment & Plan Note (Signed)
Stable with no ischemic symptoms.  Continue ASA, Plavix, statin, Coreg, ACEI.   

## 2011-10-04 NOTE — Assessment & Plan Note (Signed)
BP still high.  He is on multiple agents.  I will stop HCTZ and have him use chlorthalidone 25 mg daily instead (think this will give [place] better BP-lowering).  He will need to increase his supplement K to 40 Meq daily with BMET in 2 wks. Also, given his resistant HTN, I am going to get a renal artery doppler US evaluation for renal artery stenosis.

## 2011-10-04 NOTE — Assessment & Plan Note (Signed)
AAA has significantly enlarged, now 4.6 cm in maximal diameter (growth of 0.7 cm in 1 year).  There is complex plaque in the aneurysm sac.  Both of his parents died from AAA ruptures and he is understandably concerned.  Though the AAA has not yet reached the threshold for repair, I am concerned about the rate of growth.  I am going to send him to Dr. Myra Gianotti for vascular surgery evaluation.

## 2011-10-08 ENCOUNTER — Telehealth: Payer: Self-pay | Admitting: Oncology

## 2011-10-08 NOTE — Telephone Encounter (Signed)
s/w pt and he is aware of his 4/24 10;45 appt with [facility] at [address],[place] [phone]    aom

## 2011-10-09 ENCOUNTER — Ambulatory Visit: Payer: Medicare Other | Attending: Oncology | Admitting: Occupational Therapy

## 2011-10-09 DIAGNOSIS — M6281 Muscle weakness (generalized): Secondary | ICD-10-CM | POA: Insufficient documentation

## 2011-10-09 DIAGNOSIS — R5381 Other malaise: Secondary | ICD-10-CM | POA: Insufficient documentation

## 2011-10-09 DIAGNOSIS — IMO0001 Reserved for inherently not codable concepts without codable children: Secondary | ICD-10-CM | POA: Insufficient documentation

## 2011-10-10 ENCOUNTER — Other Ambulatory Visit (INDEPENDENT_AMBULATORY_CARE_PROVIDER_SITE_OTHER): Payer: Medicare Other

## 2011-10-10 ENCOUNTER — Encounter: Payer: Medicare Other | Admitting: Occupational Therapy

## 2011-10-10 DIAGNOSIS — I714 Abdominal aortic aneurysm, without rupture: Secondary | ICD-10-CM

## 2011-10-10 DIAGNOSIS — I10 Essential (primary) hypertension: Secondary | ICD-10-CM

## 2011-10-10 LAB — LIPID PANEL
Cholesterol: 97 mg/dL (ref 0–200)
LDL Cholesterol: 42 mg/dL (ref 0–99)
Triglycerides: 119 mg/dL (ref 0.0–149.0)
VLDL: 23.8 mg/dL (ref 0.0–40.0)

## 2011-10-10 LAB — BASIC METABOLIC PANEL
BUN: 19 mg/dL (ref 6–23)
Creatinine, Ser: 1.1 mg/dL (ref 0.4–1.5)
GFR: 68.2 mL/min (ref >60.00)
Potassium: 3.7 mEq/L (ref 3.5–5.1)

## 2011-10-16 ENCOUNTER — Ambulatory Visit: Payer: Medicare Other | Attending: Oncology | Admitting: Occupational Therapy

## 2011-10-16 DIAGNOSIS — M6281 Muscle weakness (generalized): Secondary | ICD-10-CM | POA: Insufficient documentation

## 2011-10-16 DIAGNOSIS — IMO0001 Reserved for inherently not codable concepts without codable children: Secondary | ICD-10-CM | POA: Insufficient documentation

## 2011-10-16 DIAGNOSIS — R5381 Other malaise: Secondary | ICD-10-CM | POA: Insufficient documentation

## 2011-10-21 ENCOUNTER — Ambulatory Visit: Payer: Medicare Other | Admitting: Occupational Therapy

## 2011-10-23 ENCOUNTER — Ambulatory Visit: Payer: Medicare Other | Admitting: Occupational Therapy

## 2011-10-28 ENCOUNTER — Encounter (INDEPENDENT_AMBULATORY_CARE_PROVIDER_SITE_OTHER): Payer: Medicare Other

## 2011-10-28 DIAGNOSIS — I10 Essential (primary) hypertension: Secondary | ICD-10-CM

## 2011-10-29 ENCOUNTER — Ambulatory Visit: Payer: Medicare Other | Admitting: Occupational Therapy

## 2011-10-31 ENCOUNTER — Encounter: Payer: Medicare Other | Admitting: Occupational Therapy

## 2011-11-01 ENCOUNTER — Encounter: Payer: Self-pay | Admitting: Surgery

## 2011-11-04 ENCOUNTER — Encounter: Payer: Self-pay | Admitting: Surgery

## 2011-11-04 ENCOUNTER — Ambulatory Visit (INDEPENDENT_AMBULATORY_CARE_PROVIDER_SITE_OTHER): Payer: Medicare Other | Admitting: Surgery

## 2011-11-04 VITALS — BP 137/79 | HR 64 | Temp 98.3°F | Ht 72.0 in | Wt 223.0 lb

## 2011-11-04 DIAGNOSIS — I714 Abdominal aortic aneurysm, without rupture: Secondary | ICD-10-CM

## 2011-11-04 DIAGNOSIS — I7092 Chronic total occlusion of artery of the extremities: Secondary | ICD-10-CM | POA: Insufficient documentation

## 2011-11-04 DIAGNOSIS — Z01818 Encounter for other preprocedural examination: Secondary | ICD-10-CM

## 2011-11-04 NOTE — Progress Notes (Signed)
[facility] [place]   Patient name: Mitchell [name] MRN: [id number] DOB: 1946-01-14 Sex: male   Referred by: Dr. Shirlee Latch  Reason for referral:  Chief Complaint  Patient presents with  . AAA    New pt,/Dr. Shirlee Latch    HISTORY OF PRESENT ILLNESS: The patient is referred today for evaluation and management of an abdominal aortic aneurysm. The patient has been followed with serial ultrasounds to evaluate his aneurysm. Over the course of one year this has increased in size approximately 7 mm from 3.9-4.6. The patient remained asymptomatic. Both of his parents died from ruptured aneurysms.  The patient has a history of a atrial fibrillation but is not on Coumadin and has been in a normal rhythm. He suffers from coronary artery disease and had a heart attack in 2008. He is status post coronary artery bypass grafting at that time. He is medically managed for his diabetes. He states his blood sugar ranges from 100-140. His last hemoglobin A1c was 6.3. He does suffer from COPD and is managed with Spiriva. He denies any history of claudication. He denies neurologic localizing symptoms. He recently had a melanoma excised from his back which required a complex wound healing. He does not smoke.  Past Medical History  Diagnosis Date  . CAD (coronary artery disease)     patient had non-ST-elevation MI in February 2008 and there was an 80-90% proximal LAD stenosis, 90% proximal first diagonal stenosis, 99% ostial ramus stenosis. The first obtuse marginal was subtotally occluded and there was an 80% mid RCA stenosis. EF was estimated 25% on ventriculogram, coronary artery bypass grafting was done with a LIMA to the LAD, sequential saphenous vein graft to the PDA,  . Aortic insufficiency   . AAA (abdominal aortic aneurysm)     ultrasound done in 3/11 and showed AAA 3.9 x 3.9cm  . HTN (hypertension)   . DM2 (diabetes mellitus, type 2)   . Carotid stenosis   . Torn rotator cuff     hx  of. left   . History of PFTs     obstructive/restrictive. There is some reversibility with bronchodilator. The patient actually never smoked. He is on Spiriva  . Cancer     melanoma  . Altered taste   . Pain     back  . COPD (chronic obstructive pulmonary disease)   . Melanoma 09/2010    pT2a N0 M0; clark level IV.   Marland Kitchen Range of motion deficit 09/19/2011  . Myocardial infarction   . Diabetes mellitus   . Irregular heart beat     Past Surgical History  Procedure Date  . Tonsillectomy 1951  . Arterial bypass surgry   . Melanoma excision   . Blood clots removed   . Application of wound vac   . Coronary artery bypass graft 07/2006    History   Social History  . Marital Status: Divorced    Spouse Name: N/A    Number of Children: N/A  . Years of Education: N/A   Occupational History  . Not on file.   Social History Main Topics  . Smoking status: Never Smoker   . Smokeless tobacco: Never Used  . Alcohol Use: No  . Drug Use: No  . Sexually Active: Not on file   Other Topics Concern  . Not on file   Social History Narrative  . No narrative on file    Family History  Problem Relation Age of Onset  . Heart disease Mother   .  Hyperlipidemia Mother   . Hypertension Mother   . Hypertension Father   . Other Father     AAA    Allergies as of 11/04/2011  . (No Known Allergies)    Current Outpatient Prescriptions on File Prior to Visit  Medication Sig Dispense Refill  . acetaminophen (TYLENOL) 325 MG tablet Take 650 mg by mouth every 6 (six) hours as needed.      Marland Kitchen amitriptyline (ELAVIL) 10 MG tablet Take 20 mg by mouth at bedtime. 2 tablets      . amLODipine (NORVASC) 10 MG tablet Take 10 mg by mouth daily.       Marland Kitchen aspirin 81 MG tablet Take 81 mg by mouth daily.       . benazepril (LOTENSIN) 40 MG tablet Take 40 mg by mouth daily.       . carvedilol (COREG) 25 MG tablet Take 1 tablet (25 mg total) by mouth 2 (two) times daily.      . chlorthalidone (HYGROTON) 25 MG  tablet Take 1 tablet (25 mg total) by mouth daily.  90 tablet  3  . Coenzyme Q10 (CO Q 10) 100 MG CAPS Take 200 mg by mouth daily.       . fish oil-omega-3 fatty acids 1000 MG capsule Take 2 g by mouth daily. 1200 MG every morning and night      . glipiZIDE (GLUCOTROL) 5 MG 24 hr tablet Take 5 mg by mouth daily.       . Multiple Vitamin (MULTIVITAMIN) tablet Take 1 tablet by mouth daily.        Marland Kitchen PLAVIX 75 MG tablet TAKE ONE TABLET BY MOUTH EVERY DAY  30 each  6  . potassium chloride SA (K-DUR,KLOR-CON) 20 MEQ tablet Take 1 tablet (20 mEq total) by mouth 2 (two) times daily.  180 tablet  3  . rosuvastatin (CRESTOR) 40 MG tablet Take 20 mg by mouth daily.       . salsalate (DISALCID) 500 MG tablet Take 500 mg by mouth 3 (three) times daily as needed.       . sildenafil (VIAGRA) 50 MG tablet DO NOT USE NITROGYLCERIN WITHIN 24 HOURS OF TAKING VIAGRA  10 tablet  1  . TEKTURNA 150 MG tablet TAKE ONE TABLET BY MOUTH EVERY DAY  30 each  6  . tiotropium (SPIRIVA) 18 MCG inhalation capsule Place 18 mcg into inhaler and inhale daily.        . traZODone (DESYREL) 100 MG tablet Take 100 mg by mouth at bedtime.       Marland Kitchen amoxicillin (AMOXIL) 500 MG tablet Take 500 mg by mouth 2 (two) times daily.      . ferrous sulfate 325 (65 FE) MG tablet Take 325 mg by mouth daily with breakfast.        . HYDROcodone-acetaminophen (NORCO) 5-325 MG per tablet Take 1 tablet by mouth every 4 (four) hours as needed.        . imiquimod (ALDARA) 5 % cream Apply topically 3 (three) times a week.      Marland Kitchen ketoconazole (NIZORAL) 2 % cream       . oxyCODONE-acetaminophen (PERCOCET) 5-325 MG per tablet Take 2 tablets by mouth every 4 (four) hours as needed.       . traMADol (ULTRAM) 50 MG tablet TAKE ONE TO TWO TABLETS BY MOUTH EVERY 4 HOURS AS NEEDED  30 tablet  1     REVIEW OF SYSTEMS: Cardiovascular: No chest pain, chest pressure, palpitations,  orthopnea, or dyspnea on exertion. No claudication or rest pain,  No history of DVT or  phlebitis. Pulmonary: No productive cough, asthma or wheezing. Neurologic: No weakness, paresthesias, aphasia, or amaurosis. Positive for dizziness Hematologic: No bleeding problems or clotting disorders. Musculoskeletal: No joint pain or joint swelling. Gastrointestinal: No blood in stool or hematemesis Genitourinary: No dysuria or hematuria. Psychiatric:: No history of major depression. Integumentary: No rashes or ulcers. Constitutional: No fever or chills.  PHYSICAL EXAMINATION: General: The patient appears their stated age.  Vital signs are BP 137/79  Pulse 64  Temp(Src) 98.3 F (36.8 C) (Oral)  Ht 6' (1.829 m)  Wt 223 lb (101.152 kg)  BMI 30.24 kg/m2  SpO2 98% HEENT:  No gross abnormalities Pulmonary: Respirations are non-labored Abdomen: Soft and non-tender  Musculoskeletal: There are no major deformities.   Neurologic: No focal weakness or paresthesias are detected, Skin: There are no ulcer or rashes noted. Psychiatric: The patient has normal affect. Cardiovascular: There is a regular rate and rhythm without significant murmur appreciated. No carotid bruits. Pedal pulses are not palpable. No popliteal mass appreciated  Diagnostic Studies: The patient comes with a ultrasound report of his abdomen which shows a 4.6 cm abdominal aortic aneurysm which has increased in size over the course of one year from 3.9  I have reviewed his carotid duplex findings which showed moderate disease bilaterally.  Assessment:  Abdominal aortic aneurysm Plan: Based on the patient's increase in size over the course of one year, I think that he meets criteria to be repaired. He will need to a CT angiogram of the chest abdomen and pelvis to see if he is an endovascular candidate. I have this done with the next one to 2 weeks and bring him back to discuss the results. I will also get a duplex of both lower extremities to evaluate blood flow to his feet as well as for popliteal aneurysms.     Jorge Ny, M.D. [facility] [place] Office: [phone] Pager:  [phone]

## 2011-11-04 NOTE — Progress Notes (Signed)
Addended by: Sharee Pimple on: 11/04/2011 11:58 AM   Modules accepted: Orders

## 2011-11-05 ENCOUNTER — Encounter: Payer: Medicare Other | Admitting: Occupational Therapy

## 2011-11-07 ENCOUNTER — Encounter: Payer: Medicare Other | Admitting: Occupational Therapy

## 2011-11-15 ENCOUNTER — Encounter: Payer: Self-pay | Admitting: Surgery

## 2011-11-18 ENCOUNTER — Ambulatory Visit (INDEPENDENT_AMBULATORY_CARE_PROVIDER_SITE_OTHER): Payer: Medicare Other | Admitting: Surgery

## 2011-11-18 ENCOUNTER — Encounter: Payer: Self-pay | Admitting: Surgery

## 2011-11-18 ENCOUNTER — Encounter (INDEPENDENT_AMBULATORY_CARE_PROVIDER_SITE_OTHER): Payer: Medicare Other | Admitting: *Deleted

## 2011-11-18 ENCOUNTER — Ambulatory Visit
Admission: RE | Admit: 2011-11-18 | Discharge: 2011-11-18 | Disposition: A | Discharge status: Home / Self Care | Payer: Medicare Other | Source: Ambulatory Visit | Attending: Surgery | Admitting: Surgery

## 2011-11-18 VITALS — BP 146/83 | HR 73 | Temp 98.5°F | Ht 72.0 in | Wt 221.0 lb

## 2011-11-18 DIAGNOSIS — I7092 Chronic total occlusion of artery of the extremities: Secondary | ICD-10-CM

## 2011-11-18 DIAGNOSIS — Z01818 Encounter for other preprocedural examination: Secondary | ICD-10-CM

## 2011-11-18 DIAGNOSIS — I714 Abdominal aortic aneurysm, without rupture: Secondary | ICD-10-CM

## 2011-11-18 DIAGNOSIS — Z01810 Encounter for preprocedural cardiovascular examination: Secondary | ICD-10-CM

## 2011-11-18 MED ORDER — IOHEXOL 350 MG/ML SOLN
100.0000 mL | Freq: Once | INTRAVENOUS | Status: AC | PRN
  Administered 2011-11-18: 100 mL via INTRAVENOUS

## 2011-11-18 NOTE — Progress Notes (Signed)
The patient was seen 2 weeks ago for evaluation of an abdominal aortic aneurysm. By ultrasound this had increased 7 mm in one year from 3.9-4.6 cm. The patient has a history of both parents dying from ruptured aneurysms. I sent him for a CT angiogram to further evaluate his aneurysm he comes back in today to discuss the results. I have spent approximately 55 minutes reviewing the patient's images and discussing the findings with the patient and his sister. The CT scan findings today show a 4 cm saccular aneurysm in the mid infrarenal abdominal aorta as well as ectasia versus dissection in the left common iliac artery. We discussed the pros and cons of proceeding with repair. I'm a little concerned that getting the contralateral gate open may complicate this operation. I told him I would like to get additional reformat having gone on Tera Recon at the hospital. At this point because of the patient's recent stay and nursing facility he would like to delay and operation. I think that is reasonable. The reason for once he did have this fixed was because of the relative increase in size over the course of one year. However the CT scan today shows that the size hasn't changed very little over the course of the past year and that the 4.6 cm diameter measurement by ultrasound was an accurate. I'll plan on having the patient come back to see me in 6 months with a repeat CT angiogram of the abdomen and pelvis.

## 2011-11-21 NOTE — Procedures (Unsigned)
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  HISTORY: Diabetes: Cardiac: Hypertension: Smoking: Previous Surgery:  SINGLE LEVEL ARTERIAL EXAM                         RIGHT                LEFT Brachial: Anterior tibial: Posterior tibial: Peroneal: Ankle/Brachial Index:  LOWER EXTREMITY ARTERIAL DUPLEX EXAM  DUPLEX:  Triphasic Doppler waveforms noted throughout the bilateral common femoral, femoral, popliteal, and visualized tibial arteries with no significant increase in velocities noted.  Maximum diameter measurements of the right popliteal artery are 0.87 cm x 0.96 cm. Maximum diameter measurements of the left popliteal artery are 0.97 x 0.99 cm.  IMPRESSION: 1. Patent bilateral lower extremity arterial system with no evidence     of hemodynamically significant stenoses. 2. No evidence of bilateral popliteal artery aneurysms noted.         ___________________________________________ V. Charlena Cross, MD  CH/MEDQ  D:  11/21/2011  T:  11/21/2011  Job:  [id number]

## 2011-12-30 ENCOUNTER — Telehealth: Payer: Self-pay | Admitting: Oncology

## 2011-12-30 NOTE — Telephone Encounter (Signed)
called back back as he neede to change 10/4 appt to 10/7  aom

## 2011-12-31 ENCOUNTER — Other Ambulatory Visit: Payer: Self-pay | Admitting: Dermatology

## 2012-03-20 ENCOUNTER — Other Ambulatory Visit: Payer: Medicare Other | Admitting: Lab

## 2012-03-20 ENCOUNTER — Ambulatory Visit: Payer: Medicare Other | Admitting: Oncology

## 2012-03-23 ENCOUNTER — Ambulatory Visit (HOSPITAL_BASED_OUTPATIENT_CLINIC_OR_DEPARTMENT_OTHER): Payer: Medicare Other | Admitting: Oncology

## 2012-03-23 ENCOUNTER — Encounter: Payer: Self-pay | Admitting: Oncology

## 2012-03-23 ENCOUNTER — Other Ambulatory Visit (HOSPITAL_BASED_OUTPATIENT_CLINIC_OR_DEPARTMENT_OTHER): Payer: Medicare Other | Admitting: Lab

## 2012-03-23 VITALS — BP 143/84 | HR 66 | Temp 96.9°F | Resp 18 | Ht 72.0 in | Wt 220.5 lb

## 2012-03-23 DIAGNOSIS — J449 Chronic obstructive pulmonary disease, unspecified: Secondary | ICD-10-CM

## 2012-03-23 DIAGNOSIS — C439 Malignant melanoma of skin, unspecified: Secondary | ICD-10-CM

## 2012-03-23 DIAGNOSIS — M2560 Stiffness of unspecified joint, not elsewhere classified: Secondary | ICD-10-CM

## 2012-03-23 DIAGNOSIS — C4359 Malignant melanoma of other part of trunk: Secondary | ICD-10-CM

## 2012-03-23 DIAGNOSIS — I714 Abdominal aortic aneurysm, without rupture: Secondary | ICD-10-CM

## 2012-03-23 LAB — CBC WITH DIFFERENTIAL/PLATELET
EOS%: 2.6 % (ref 0.0–7.0)
LYMPH%: 43.1 % (ref 14.0–49.0)
MCH: 32.3 pg (ref 27.2–33.4)
MCV: 91.4 fL (ref 79.3–98.0)
MONO%: 9.7 % (ref 0.0–14.0)
Platelets: 190 10*3/uL (ref 140–400)
RBC: 4.64 10*6/uL (ref 4.20–5.82)
RDW: 12.8 % (ref 11.0–14.6)

## 2012-03-23 LAB — COMPREHENSIVE METABOLIC PANEL (CC13)
AST: 22 U/L (ref 5–34)
Albumin: 4 g/dL (ref 3.5–5.0)
Alkaline Phosphatase: 47 U/L (ref 40–150)
BUN: 19 mg/dL (ref 7.0–26.0)
Potassium: 3.7 mEq/L (ref 3.5–5.1)
Sodium: 136 mEq/L (ref 136–145)
Total Bilirubin: 0.6 mg/dL (ref 0.20–1.20)
Total Protein: 8.2 g/dL (ref 6.4–8.3)

## 2012-03-23 NOTE — Progress Notes (Signed)
[facility] OFFICE PROGRESS NOTE  Cc:  Kaleen Mask, MD  DIAGNOSIS:  History of  pT2a N0 M0 malignant melanoma, Clark level IV, Breslow thickness 1.75 mm with mitotic figures 20/mm sq without ulceration, without lymphatic invasion, without perineural invasion, without microscopic satellitosis.     PAST THERAPY:   Sentinel node biopsy and wide excision on 10/22/2010 without residual cancer or positive nodes.   CURRENT THERAPY:  Watchful observation.   INTERVAL HISTORY: Mitchell [name] 66 y.o. male returns for regular followup. Surgical site on his back has healed without any residual open wound. He notices some neuropathic pain to the area. He is on Amitriptyline to help with this. He follows with Dr. Yetta Barre his dermatologist every 6 months in between appointments with [place]. He has numerous seborrhoic keratosis  spots on the skin. However there is one dark lesion in the left axial calf that is being watched by his dermatologist.  He has mild fatigue; however, he is independent of all activities daily living. He has mild DOE and wheezing for which he uses his inhalers for COPD. He has a AAA that is being watched by vascular surgery. Routine imaging is scheduled for December 2013.  Patient denies headache, visual changes, confusion, drenching night sweats, palpable lymph node swelling, mucositis, odynophagia, dysphagia, nausea vomiting, jaundice, chest pain, palpitation, productive cough, gum bleeding, epistaxis, hematemesis, hemoptysis, abdominal pain, abdominal swelling, early satiety, melena, hematochezia, hematuria, skin rash, spontaneous bleeding, joint swelling, joint pain, heat or cold intolerance, bowel bladder incontinence, back pain, focal motor weakness, paresthesia, depression, suicidal or homocidal ideation, feeling hopelessness.   Past Medical History  Diagnosis Date  . CAD (coronary artery disease)     patient had non-ST-elevation MI in February 2008 and there was an  80-90% proximal LAD stenosis, 90% proximal first diagonal stenosis, 99% ostial ramus stenosis. The first obtuse marginal was subtotally occluded and there was an 80% mid RCA stenosis. EF was estimated 25% on ventriculogram, coronary artery bypass grafting was done with a LIMA to the LAD, sequential saphenous vein graft to the PDA,  . Aortic insufficiency   . AAA (abdominal aortic aneurysm)     ultrasound done in 3/11 and showed AAA 3.9 x 3.9cm  . HTN (hypertension)   . DM2 (diabetes mellitus, type 2)   . Carotid stenosis   . Torn rotator cuff     hx of. left   . History of PFTs     obstructive/restrictive. There is some reversibility with bronchodilator. The patient actually never smoked. He is on Spiriva  . Cancer     melanoma  . Altered taste   . Pain     back  . COPD (chronic obstructive pulmonary disease)   . Melanoma 09/2010    pT2a N0 M0; clark level IV.   Marland Kitchen Range of motion deficit 09/19/2011  . Myocardial infarction   . Diabetes mellitus   . Irregular heart beat     Past Surgical History  Procedure Date  . Tonsillectomy 1951  . Arterial bypass surgry   . Melanoma excision   . Blood clots removed   . Application of wound vac   . Coronary artery bypass graft 07/2006    Current Outpatient Prescriptions  Medication Sig Dispense Refill  . acetaminophen (TYLENOL) 325 MG tablet Take 650 mg by mouth every 6 (six) hours as needed.      Marland Kitchen amitriptyline (ELAVIL) 10 MG tablet Take 20 mg by mouth at bedtime. 2 tablets      .  amLODipine (NORVASC) 10 MG tablet Take 10 mg by mouth daily.       Marland Kitchen aspirin 81 MG tablet Take 81 mg by mouth daily.       . benazepril (LOTENSIN) 40 MG tablet Take 40 mg by mouth daily.       . carvedilol (COREG) 25 MG tablet Take 1 tablet (25 mg total) by mouth 2 (two) times daily.      . chlorthalidone (HYGROTON) 25 MG tablet Take 1 tablet (25 mg total) by mouth daily.  90 tablet  3  . Coenzyme Q10 (CO Q 10) 100 MG CAPS Take 200 mg by mouth daily.       Tery Sanfilippo Sodium (COLACE PO) Take by mouth 2 (two) times daily.      . fish oil-omega-3 fatty acids 1000 MG capsule Take 2 g by mouth daily. 1200 MG every morning and night      . glipiZIDE (GLUCOTROL) 5 MG 24 hr tablet Take 10 mg by mouth daily.       Marland Kitchen ketoconazole (NIZORAL) 2 % cream       . Multiple Vitamin (MULTIVITAMIN) tablet Take 1 tablet by mouth daily.        Marland Kitchen PLAVIX 75 MG tablet TAKE ONE TABLET BY MOUTH EVERY DAY  30 each  6  . potassium chloride SA (K-DUR,KLOR-CON) 20 MEQ tablet Take 1 tablet (20 mEq total) by mouth 2 (two) times daily.  180 tablet  3  . rosuvastatin (CRESTOR) 40 MG tablet Take 20 mg by mouth daily.       . salsalate (DISALCID) 500 MG tablet Take 500 mg by mouth 3 (three) times daily as needed.       . sildenafil (VIAGRA) 50 MG tablet DO NOT USE NITROGYLCERIN WITHIN 24 HOURS OF TAKING VIAGRA  10 tablet  1  . TEKTURNA 150 MG tablet TAKE ONE TABLET BY MOUTH EVERY DAY  30 each  6  . tiotropium (SPIRIVA) 18 MCG inhalation capsule Place 18 mcg into inhaler and inhale daily.        . traZODone (DESYREL) 100 MG tablet Take 100 mg by mouth at bedtime.         ALLERGIES:   has no known allergies.  REVIEW OF SYSTEMS:  The rest of the 14-point review of system was negative.   Filed Vitals:   03/23/12 1508  BP: 143/84  Pulse: 66  Temp: 96.9 F (36.1 C)  Resp: 18   Wt Readings from Last 3 Encounters:  03/23/12 220 lb 8 oz (100.018 kg)  11/18/11 221 lb (100.245 kg)  11/04/11 223 lb (101.152 kg)   ECOG Performance status: 1  PHYSICAL EXAMINATION:   General:  well-nourished in no acute distress.  Eyes:  no scleral icterus.  ENT:  There were no oropharyngeal lesions.  Neck was without thyromegaly.  Lymphatics:  Negative cervical, supraclavicular or axillary adenopathy.  Respiratory: lungs were clear bilaterally without wheezing or crackles.  Cardiovascular:  Regular rate and rhythm, S1/S2, without murmur, rub or gallop.  There was no pedal edema.  GI:  abdomen was  soft, flat, nontender, nondistended, without organomegaly.  Muscoloskeletal:  no spinal tenderness of palpation of vertebral spine.  Skin exam was without echymosis, petichae.  Complete skin exam showed numerous seborrheic keratosis lesions on his back and chest.  There were two small dark lesions in the posterior calves.  Each measured about 3mm.  The one in the left calf had a dark center without ulceration.  Neuro exam was nonfocal.  Patient was able to get on and off exam table without assistance.  Gait was normal.  Patient was alerted and oriented.  Attention was good.   Language was appropriate.  Mood was normal without depression.  Speech was not pressured.  Thought content was not tangential.      LABORATORY/RADIOLOGY DATA:  Lab Results  Component Value Date   WBC 7.6 03/23/2012   HGB 15.0 03/23/2012   HCT 42.4 03/23/2012   PLT 190 03/23/2012   GLUCOSE 104* 03/23/2012   CHOL 97 10/10/2011   TRIG 119.0 10/10/2011   HDL 31.50* 10/10/2011   LDLCALC 42 10/10/2011   ALKPHOS 47 03/23/2012   ALT 26 03/23/2012   AST 22 03/23/2012   NA 136 03/23/2012   K 3.7 03/23/2012   CL 100 03/23/2012   CREATININE 1.3 03/23/2012   BUN 19.0 03/23/2012   CO2 26 03/23/2012   PSA 2.61 09/16/2007   INR 1.36 10/29/2010   MICROALBUR 0.55 07/17/2009    ASSESSMENT AND PLAN:   1. Diabetes mellitus, type II:  He is on glipizide per PCP. 2. Hyperlipidemia.  He is on rosuvastatin per PCP. 3. Hypertension, well controlled on benazepril, carvedilol per PCP. 4. History of coronary artery disease.  He is on aspirin, Plavix, beta blocker, simvastatin, ACE inhibitor per cardiologist. 5. Chronic obstructive pulmonary disease.  On Spiriva.  6. History of melanoma:  On clinical history, physical exam, lab tests, there is no evidence of disease recurrence or metastases. Recommend that he follow-up with Dr Yetta Barre as recommended. There is no indication per NCCN  guideline to obtain routine imaging as he does not have any concerning  symptoms to warrant surveillance CT scan. 7. Abdominal aortic aneurysm: He is followed by vascular surgery. 8. Followup: in about 6 months as scheduled.    The length of time of the face-to-face encounter was 15 minutes. More than 50% of time was spent counseling and coordination of care.

## 2012-04-01 ENCOUNTER — Telehealth: Payer: Self-pay | Admitting: *Deleted

## 2012-04-01 NOTE — Telephone Encounter (Signed)
Pt called to report the spot on his back where his melanoma was excised over a year ago is bleeding a little bit onto his t shirt in the mornings and a few times a day for the past few days.  States is a little red around the site.  Notified Clenton Pare, NP who instructs for pt to contact Dermatologist to assess.  I instructed pt to call his Dermatologist office, [facility],  Dr. Yetta Barre to assess the changes in this area.  He agreed,  States he will call them for appointment.

## 2012-04-24 ENCOUNTER — Telehealth: Payer: Self-pay | Admitting: *Deleted

## 2012-04-24 NOTE — Telephone Encounter (Signed)
Tekturna 150mg  - currently getting 1 month supply at [facility] pharmacy.  With his [employer] plan, patient can get a 90 day supply at cheaper cost, save $17.00.  Requesting Dr. Shirlee Latch to call in 90 day supply for this patient.    [facility] pharmacy on [address] [place], [place] [phone]

## 2012-04-28 ENCOUNTER — Telehealth: Payer: Self-pay

## 2012-05-22 ENCOUNTER — Encounter: Payer: Self-pay | Admitting: Surgery

## 2012-05-25 ENCOUNTER — Ambulatory Visit
Admission: RE | Admit: 2012-05-25 | Discharge: 2012-05-25 | Disposition: A | Discharge status: Home / Self Care | Payer: Medicare Other | Source: Ambulatory Visit | Attending: Surgery | Admitting: Surgery

## 2012-05-25 ENCOUNTER — Ambulatory Visit (INDEPENDENT_AMBULATORY_CARE_PROVIDER_SITE_OTHER): Payer: Medicare Other | Admitting: Surgery

## 2012-05-25 ENCOUNTER — Encounter: Payer: Self-pay | Admitting: Cardiology

## 2012-05-25 ENCOUNTER — Encounter: Payer: Self-pay | Admitting: Surgery

## 2012-05-25 ENCOUNTER — Ambulatory Visit (INDEPENDENT_AMBULATORY_CARE_PROVIDER_SITE_OTHER): Payer: Medicare Other | Admitting: Cardiology

## 2012-05-25 VITALS — BP 147/70 | HR 70 | Resp 16 | Ht 72.0 in | Wt 214.0 lb

## 2012-05-25 VITALS — BP 130/68 | HR 61 | Ht 72.0 in | Wt 214.0 lb

## 2012-05-25 DIAGNOSIS — I714 Abdominal aortic aneurysm, without rupture: Secondary | ICD-10-CM

## 2012-05-25 DIAGNOSIS — I10 Essential (primary) hypertension: Secondary | ICD-10-CM

## 2012-05-25 DIAGNOSIS — I25810 Atherosclerosis of coronary artery bypass graft(s) without angina pectoris: Secondary | ICD-10-CM

## 2012-05-25 DIAGNOSIS — I7092 Chronic total occlusion of artery of the extremities: Secondary | ICD-10-CM

## 2012-05-25 DIAGNOSIS — I6529 Occlusion and stenosis of unspecified carotid artery: Secondary | ICD-10-CM

## 2012-05-25 DIAGNOSIS — I5032 Chronic diastolic (congestive) heart failure: Secondary | ICD-10-CM

## 2012-05-25 DIAGNOSIS — I359 Nonrheumatic aortic valve disorder, unspecified: Secondary | ICD-10-CM

## 2012-05-25 DIAGNOSIS — E785 Hyperlipidemia, unspecified: Secondary | ICD-10-CM

## 2012-05-25 LAB — CREATININE, SERUM: Creat: 1.2 mg/dL (ref 0.50–1.35)

## 2012-05-25 LAB — BUN: BUN: 24 mg/dL — ABNORMAL HIGH (ref 6–23)

## 2012-05-25 MED ORDER — IOHEXOL 350 MG/ML SOLN
80.0000 mL | Freq: Once | INTRAVENOUS | Status: AC | PRN
  Administered 2012-05-25: 80 mL via INTRAVENOUS

## 2012-05-25 MED ORDER — ATORVASTATIN CALCIUM 80 MG PO TABS: 80.0000 mg | ORAL_TABLET | Freq: Every day | ORAL | Status: DC

## 2012-05-25 NOTE — Patient Instructions (Addendum)
Stop crestor.  Start atorvastatin 80mg  daily.   Your physician recommends that you return for a FASTING lipid profile /liver profile in 3 months.  Your physician has requested that you have a carotid duplex. This test is an ultrasound of the carotid arteries in your neck. It looks at blood flow through these arteries that supply the brain with blood. Allow one hour for this exam. There are no restrictions or special instructions. March 2013  Your physician wants you to follow-up in: 6 months with Dr Shirlee Latch. (June 2014). You will receive a reminder letter in the mail two months in advance. If you don't receive a letter, please call our office to schedule the follow-up appointment.

## 2012-05-25 NOTE — Progress Notes (Signed)
Patient ID: Mitchell [name], male   DOB: 1945-12-15, 66 y.o.   MRN: [id number] PCP: Dr. Jeannetta Nap  66 yo with h/o CAD s/p CABG, aortic aneurysm, HTN, DM2 returns to cardiology clinic.  He denies chest pain.  No exertional dyspnea.  BP is well-controlled now, running 115-120 systolic at home.  Of note, he had an abdominal ultrasound with significant increase in the last year in the maximal diameter of the aneurysm from 3.9 cm => 4.6 cm.  He was seen by Dr. Myra Gianotti at [facility] and CTA abdomen was done, showing a 4 cm saccular infrarenal aneurysm.   ECG:  NSR, normal  Labs (8/10): K 3.8, creatinine 1.12, LDL 61, HDL 29 Labs (3/11): HDL 32, LDL 55, LFTs normal Labs (13/08): LDL 55, HDL 27, K 3.9, creatinine 1.0 Labs (4/12): LDL 53, HDL 27 Labs (10/12): LDL 49, HDL 39 Labs (4/13): LDL 42, HDL 31 Labs (10/13): K 3.7, creatinine 1.3  Allergies (verified):  No Known Drug Allergies  Past Medical History: 1. Coronary artery disease.  The patient had non-ST-elevation MI in February 2008 and there was an 80-90% proximal LAD stenosis, 90%proximal first diagonal stenosis, 99% ostial ramus stenosis.  The first obtuse marginal was subtotally occluded and there was an 80% mid RCA stenosis.  EF was estimated 25% on ventriculogram, coronary artery bypass grafting was done with a LIMA to the LAD, sequential saphenous vein graft to the PDA, and PLOM, saphenous graft to the diagonal, saphenous vein graft to the ramus. 2. Aortic insufficiency: Echo (3/11): EF 55-60%, moderate diastolic dysfunction, moderate aortic insufficiency, moderate LAE.  Echo (3/12) with EF 50-55%, grade II diastolic dysfunction, mild MR, mild aortic insufficiency.  3. Infrarenal AAA. Abdominal US 3/11 with AAA 3.9 x 3.9 cm.  Abdominal US 3/12 with AAA 3.8 x 3.9 cm.  Abdominal US (3/13) with AAA 4.6 x 3.9 with complex plaque within the AAA.  CTA abdomen showed 4 cm saccular infrarenal aneurysm.  4. Hypertension: Renal artery dopplers in 5/13 with no  significant renal artery stenosis.  5. Type 2 diabetes. 6. History of torn left rotator cuff. 7. History of obstructive/restrictive PFTs.  There is some reversibility with bronchodilator.  The patient actually never smoked.  He is on Spiriva. 8. Holter (3/10):  Frequent PACs and blocked PACs.  PVCs with trigeminy.  No long pauses.  Slowest HR in 40s while asleep.  9. Carotid stenosis.  Carotid dopplers (3/12) with stable 40-59% bilateral ICA stenosis.  Carotid dopplers (3/13) with 40-59% bilateral stenosis.  10.  Melanoma: s/p excision from back 5/12.   Family History: Both the patient's mother and father had abdominal aortic aneurysm and both died due to complications with attempted AAA repair.  Social History: The patient is a lifetime nonsmoker. He is unemployed but draws a pension from the [place].  He lives by himself.  He has no close family left, he only has cousins. He plays the piano in church.    Current Outpatient Prescriptions  Medication Sig Dispense Refill  . acetaminophen (TYLENOL) 325 MG tablet Take 650 mg by mouth every 6 (six) hours as needed.      Marland Kitchen amitriptyline (ELAVIL) 10 MG tablet Take 20 mg by mouth at bedtime. 2 tablets      . amLODipine (NORVASC) 10 MG tablet Take 10 mg by mouth daily.       Marland Kitchen aspirin 81 MG tablet Take 81 mg by mouth daily.       . benazepril (LOTENSIN) 40 MG tablet  Take 40 mg by mouth daily.       . carvedilol (COREG) 25 MG tablet Take 1 tablet (25 mg total) by mouth 2 (two) times daily.      . chlorthalidone (HYGROTON) 25 MG tablet Take 1 tablet (25 mg total) by mouth daily.  90 tablet  3  . Coenzyme Q10 (CO Q 10) 100 MG CAPS Take 200 mg by mouth daily.       Tery Sanfilippo Sodium (COLACE PO) Take by mouth 2 (two) times daily.      . fish oil-omega-3 fatty acids 1000 MG capsule Take 2 g by mouth daily. 1200 MG every morning and night      . glipiZIDE (GLUCOTROL) 5 MG 24 hr tablet Take 10 mg by mouth daily.       . Multiple Vitamin (MULTIVITAMIN) tablet  Take 1 tablet by mouth daily.        Marland Kitchen PLAVIX 75 MG tablet TAKE ONE TABLET BY MOUTH EVERY DAY  30 each  6  . potassium chloride SA (K-DUR,KLOR-CON) 20 MEQ tablet Take 1 tablet (20 mEq total) by mouth 2 (two) times daily.  180 tablet  3  . rosuvastatin (CRESTOR) 40 MG tablet Take 20 mg by mouth daily.       . salsalate (DISALCID) 500 MG tablet Take 500 mg by mouth 3 (three) times daily as needed.       . sildenafil (VIAGRA) 50 MG tablet DO NOT USE NITROGYLCERIN WITHIN 24 HOURS OF TAKING VIAGRA  10 tablet  1  . TEKTURNA 150 MG tablet TAKE ONE TABLET BY MOUTH EVERY DAY  30 each  6  . tiotropium (SPIRIVA) 18 MCG inhalation capsule Place 18 mcg into inhaler and inhale daily.        . traZODone (DESYREL) 100 MG tablet Take 100 mg by mouth at bedtime.          BP 130/68  Pulse 61  Ht 6' (1.829 m)  Wt 214 lb (97.07 kg)  BMI 29.02 kg/m2 General:  Well developed, well nourished, in no acute distress. Neck:  Neck supple, no JVD. No masses, thyromegaly or abnormal cervical nodes. Lungs:  Clear bilaterally to auscultation. Heart:  Non-displaced PMI, chest non-tender; regular rate and rhythm, S1, S2 without murmurs, rubs. +S4. Carotid upstroke normal, no bruit. Pedals normal pulses. Trace bilateral ankle edema Abdomen:  Bowel sounds positive; abdomen soft and non-tender without masses, organomegaly, or hernias noted. No hepatosplenomegaly. Extremities:  No clubbing or cyanosis. Neurologic:  Alert and oriented x 3. Psych:  Normal affect.  Assessment/Plan:  CORONARY ARTERY DISEASE Stable with no ischemic symptoms. Continue ASA, Plavix, statin, Coreg, ACEI.  HYPERLIPIDEMIA Patient needs to switch to atorvastatin to get his meds from the [place].  As he is taking Crestor 20 , I will change him to atorvastatin 80 with lipids/LFTs in 2 months.  AAA  Following with Dr. Myra Gianotti, approaching need for repair.  CAROTID ARTERY STENOSIS  Stable moderate carotid stenosis. Repeat carotid dopplers in 3/14.  AORTIC  INSUFFICIENCY  Only mild on last echo. May be improved due to better BP control.  HYPERTENSION BP better controlled.  No renal artery stenosis on dopplers in 5/13.  CHRONIC DIASTOLIC HEART FAILURE Minimal dyspnea, no volume overload.   Marca Ancona 05/25/2012

## 2012-05-25 NOTE — Progress Notes (Signed)
[facility]   Patient name: Mitchell [name] MRN: [id number] DOB: Feb 20, 1946 Sex: male     Chief Complaint  Patient presents with  . AAA    HISTORY OF PRESENT ILLNESS: The patient is back today for followup. Initially I met him 6 months ago for evaluation of an abdominal aortic aneurysm. At that time it measured 4.6 cm. There was concern that it has increased significantly over the course of the year, however a repeat CT scan showed that it was relatively stable. The patient did not wish to proceed with operation at that time but rather delay it because of his activities in the fall. His back today for followup. He has no complaints.  Past Medical History  Diagnosis Date  . CAD (coronary artery disease)     patient had non-ST-elevation MI in February 2008 and there was an 80-90% proximal LAD stenosis, 90% proximal first diagonal stenosis, 99% ostial ramus stenosis. The first obtuse marginal was subtotally occluded and there was an 80% mid RCA stenosis. EF was estimated 25% on ventriculogram, coronary artery bypass grafting was done with a LIMA to the LAD, sequential saphenous vein graft to the PDA,  . Aortic insufficiency   . AAA (abdominal aortic aneurysm)     ultrasound done in 3/11 and showed AAA 3.9 x 3.9cm  . HTN (hypertension)   . DM2 (diabetes mellitus, type 2)   . Carotid stenosis   . Torn rotator cuff     hx of. left   . History of PFTs     obstructive/restrictive. There is some reversibility with bronchodilator. The patient actually never smoked. He is on Spiriva  . Cancer     melanoma  . Altered taste   . Pain     back  . COPD (chronic obstructive pulmonary disease)   . Melanoma 09/2010    pT2a N0 M0; clark level IV.   Marland Kitchen Range of motion deficit 09/19/2011  . Myocardial infarction   . Diabetes mellitus   . Irregular heart beat     Past Surgical History  Procedure Date  . Tonsillectomy 1951  . Arterial bypass surgry   . Melanoma  excision   . Blood clots removed   . Application of wound vac   . Coronary artery bypass graft 07/2006    History   Social History  . Marital Status: Divorced    Spouse Name: N/A    Number of Children: N/A  . Years of Education: N/A   Occupational History  . Not on file.   Social History Main Topics  . Smoking status: Never Smoker   . Smokeless tobacco: Never Used  . Alcohol Use: No  . Drug Use: No  . Sexually Active: Not on file   Other Topics Concern  . Not on file   Social History Narrative  . No narrative on file    Family History  Problem Relation Age of Onset  . Heart disease Mother   . Hyperlipidemia Mother   . Hypertension Mother   . Hypertension Father   . Other Father     AAA    Allergies as of 05/25/2012  . (No Known Allergies)    Current Outpatient Prescriptions on File Prior to Visit  Medication Sig Dispense Refill  . acetaminophen (TYLENOL) 325 MG tablet Take 650 mg by mouth every 6 (six) hours as needed.      Marland Kitchen amitriptyline (ELAVIL) 10 MG tablet Take 20 mg by mouth at bedtime.  2 tablets      . amLODipine (NORVASC) 10 MG tablet Take 10 mg by mouth daily.       Marland Kitchen aspirin 81 MG tablet Take 81 mg by mouth daily.       Marland Kitchen atorvastatin (LIPITOR) 80 MG tablet Take 1 tablet (80 mg total) by mouth daily.  90 tablet  3  . benazepril (LOTENSIN) 40 MG tablet Take 40 mg by mouth daily.       . carvedilol (COREG) 25 MG tablet Take 1 tablet (25 mg total) by mouth 2 (two) times daily.      . chlorthalidone (HYGROTON) 25 MG tablet Take 1 tablet (25 mg total) by mouth daily.  90 tablet  3  . Coenzyme Q10 (CO Q 10) 100 MG CAPS Take 200 mg by mouth daily.       Tery Sanfilippo Sodium (COLACE PO) Take by mouth 2 (two) times daily.      . fish oil-omega-3 fatty acids 1000 MG capsule Take 2 g by mouth daily. 1200 MG every morning and night      . glipiZIDE (GLUCOTROL) 5 MG 24 hr tablet Take by mouth daily.       . Multiple Vitamin (MULTIVITAMIN) tablet Take 1 tablet by  mouth daily.        Marland Kitchen PLAVIX 75 MG tablet TAKE ONE TABLET BY MOUTH EVERY DAY  30 each  6  . potassium chloride SA (K-DUR,KLOR-CON) 20 MEQ tablet Take 1 tablet (20 mEq total) by mouth 2 (two) times daily.  180 tablet  3  . salsalate (DISALCID) 500 MG tablet Take 500 mg by mouth 3 (three) times daily as needed.       . sildenafil (VIAGRA) 50 MG tablet DO NOT USE NITROGYLCERIN WITHIN 24 HOURS OF TAKING VIAGRA  10 tablet  1  . TEKTURNA 150 MG tablet TAKE ONE TABLET BY MOUTH EVERY DAY  30 each  6  . tiotropium (SPIRIVA) 18 MCG inhalation capsule Place 18 mcg into inhaler and inhale daily.        . traZODone (DESYREL) 100 MG tablet Take 100 mg by mouth at bedtime.        No current facility-administered medications on file prior to visit.     REVIEW OF SYSTEMS: No changes from prior visit  PHYSICAL EXAMINATION:   Vital signs are BP 147/70  Pulse 70  Resp 16  Ht 6' (1.829 m)  Wt 214 lb (97.07 kg)  BMI 29.02 kg/m2  SpO2 95% General: The patient appears their stated age. HEENT:  No gross abnormalities Pulmonary:  Non labored breathing Abdomen: Soft and non-tender Musculoskeletal: There are no major deformities. Neurologic: No focal weakness or paresthesias are detected, Skin: There are no ulcer or rashes noted. Psychiatric: The patient has normal affect. Cardiovascular: There is a regular rate and rhythm without significant murmur appreciated.   Diagnostic Studies CT scan was reviewed today. This shows a stable saccular 4.6 cm aneurysm he is  Assessment: Abdominal aortic aneurysm Plan: Based on the saccular component of his aneurysm, have recommended proceeding with endovascular repair. The patient is a little reluctant to proceed with operation at this time. I do feel that repair of his aneurysm may be somewhat challenging given the size of his infrarenal aorta. This may require an aorto U. the iliac device with a femoral-femoral bypass. I did discuss this with the patient. Like to  delay the operation for several months. For that reason I recommended repeating his CAT scan in  6 months. I told him that if he experienced severe abdominal pain to go the emergency department immediately. Otherwise, I'll see him in 6 months  V. Charlena Cross, M.D. [facility] Office: [phone] Pager:  [phone]

## 2012-05-26 NOTE — Addendum Note (Signed)
Addended by: Sharee Pimple on: 05/26/2012 08:45 AM   Modules accepted: Orders

## 2012-06-01 ENCOUNTER — Other Ambulatory Visit: Payer: Self-pay | Admitting: Cardiology

## 2012-07-09 ENCOUNTER — Other Ambulatory Visit: Payer: Self-pay | Admitting: Dermatology

## 2012-08-12 ENCOUNTER — Other Ambulatory Visit: Payer: Self-pay | Admitting: Dermatology

## 2012-08-25 ENCOUNTER — Telehealth: Payer: Self-pay | Admitting: Cardiology

## 2012-08-25 ENCOUNTER — Other Ambulatory Visit (INDEPENDENT_AMBULATORY_CARE_PROVIDER_SITE_OTHER): Payer: Medicare Other

## 2012-08-25 ENCOUNTER — Encounter (INDEPENDENT_AMBULATORY_CARE_PROVIDER_SITE_OTHER): Payer: Medicare Other

## 2012-08-25 DIAGNOSIS — I6529 Occlusion and stenosis of unspecified carotid artery: Secondary | ICD-10-CM

## 2012-08-25 DIAGNOSIS — I25810 Atherosclerosis of coronary artery bypass graft(s) without angina pectoris: Secondary | ICD-10-CM

## 2012-08-25 DIAGNOSIS — I714 Abdominal aortic aneurysm, without rupture: Secondary | ICD-10-CM

## 2012-08-25 LAB — HEPATIC FUNCTION PANEL
AST: 19 U/L (ref 0–37)
Albumin: 3.6 g/dL (ref 3.5–5.2)
Alkaline Phosphatase: 42 U/L (ref 39–117)
Bilirubin, Direct: 0 mg/dL (ref 0.0–0.3)

## 2012-08-25 LAB — LIPID PANEL
HDL: 21.2 mg/dL — ABNORMAL LOW (ref >39.00)
Total CHOL/HDL Ratio: 5
VLDL: 25.2 mg/dL (ref 0.0–40.0)

## 2012-08-25 NOTE — Telephone Encounter (Signed)
Walk in pt Form " Application For Renewal of Disability Pla-Card" Dropped Off by patient Mitchell [name]/[place] off today (08/25/12) Will hold Onto until they are back in to  Office on (Wednesday 08/26/12)   08/25/12/KM

## 2012-09-18 ENCOUNTER — Telehealth: Payer: Self-pay | Admitting: Oncology

## 2012-09-18 ENCOUNTER — Ambulatory Visit (HOSPITAL_BASED_OUTPATIENT_CLINIC_OR_DEPARTMENT_OTHER): Payer: Medicare Other | Admitting: Oncology

## 2012-09-18 ENCOUNTER — Other Ambulatory Visit (HOSPITAL_BASED_OUTPATIENT_CLINIC_OR_DEPARTMENT_OTHER): Payer: Medicare Other | Admitting: Lab

## 2012-09-18 VITALS — BP 141/76 | HR 61 | Temp 97.6°F | Resp 18 | Ht 72.0 in | Wt 216.2 lb

## 2012-09-18 DIAGNOSIS — I10 Essential (primary) hypertension: Secondary | ICD-10-CM

## 2012-09-18 DIAGNOSIS — C4359 Malignant melanoma of other part of trunk: Secondary | ICD-10-CM

## 2012-09-18 DIAGNOSIS — M2560 Stiffness of unspecified joint, not elsewhere classified: Secondary | ICD-10-CM

## 2012-09-18 DIAGNOSIS — J449 Chronic obstructive pulmonary disease, unspecified: Secondary | ICD-10-CM

## 2012-09-18 DIAGNOSIS — E119 Type 2 diabetes mellitus without complications: Secondary | ICD-10-CM

## 2012-09-18 DIAGNOSIS — C439 Malignant melanoma of skin, unspecified: Secondary | ICD-10-CM

## 2012-09-18 LAB — COMPREHENSIVE METABOLIC PANEL (CC13)
ALT: 20 U/L (ref 0–55)
AST: 19 U/L (ref 5–34)
CO2: 28 mEq/L (ref 22–29)
Calcium: 9 mg/dL (ref 8.4–10.4)
Chloride: 101 mEq/L (ref 98–107)
Creatinine: 1.2 mg/dL (ref 0.7–1.3)
Potassium: 3.7 mEq/L (ref 3.5–5.1)
Sodium: 138 mEq/L (ref 136–145)
Total Protein: 7.9 g/dL (ref 6.4–8.3)

## 2012-09-18 LAB — CBC WITH DIFFERENTIAL/PLATELET
BASO%: 0.6 % (ref 0.0–2.0)
Eosinophils Absolute: 0.2 10*3/uL (ref 0.0–0.5)
LYMPH%: 38.7 % (ref 14.0–49.0)
MCHC: 34.7 g/dL (ref 32.0–36.0)
MONO#: 0.8 10*3/uL (ref 0.1–0.9)
NEUT#: 3.6 10*3/uL (ref 1.5–6.5)
Platelets: 158 10*3/uL (ref 140–400)
RBC: 4.62 10*6/uL (ref 4.20–5.82)
RDW: 13.3 % (ref 11.0–14.6)
WBC: 7.6 10*3/uL (ref 4.0–10.3)
lymph#: 2.9 10*3/uL (ref 0.9–3.3)

## 2012-09-18 LAB — LACTATE DEHYDROGENASE (CC13): LDH: 166 U/L (ref 125–245)

## 2012-09-18 NOTE — Telephone Encounter (Signed)
Gave pt appt for lab and ML for [facility]Arch 2015

## 2012-09-21 NOTE — Progress Notes (Signed)
Mitchell [name] OFFICE PROGRESS NOTE  Cc:  Kaleen Mask, MD  DIAGNOSIS:  History of  pT2a N0 M0 malignant melanoma, Clark level IV, Breslow thickness 1.75 mm with mitotic figures 20/mm sq without ulceration, without lymphatic invasion, without perineural invasion, without microscopic satellitosis.     PAST THERAPY:   Sentinel node biopsy and wide excision on 10/22/2010 without residual cancer or positive nodes.   CURRENT THERAPY:  Watchful observation.   INTERVAL HISTORY: Mitchell [name] 67 y.o. male returns for regular followup by himself.  He still has mild, stable SOB and DOE which he attributes to baseline COPD. He is still active.  He is independent of all activities of daily living. He denies any new skin lesions.  The rest of the 14 point review of system was negative.    Past Medical History  Diagnosis Date  . CAD (coronary artery disease)     patient had non-ST-elevation MI in February 2008 and there was an 80-90% proximal LAD stenosis, 90% proximal first diagonal stenosis, 99% ostial ramus stenosis. The first obtuse marginal was subtotally occluded and there was an 80% mid RCA stenosis. EF was estimated 25% on ventriculogram, coronary artery bypass grafting was done with a LIMA to the LAD, sequential saphenous vein graft to the PDA,  . Aortic insufficiency   . AAA (abdominal aortic aneurysm)     ultrasound done in 3/11 and showed AAA 3.9 x 3.9cm  . HTN (hypertension)   . DM2 (diabetes mellitus, type 2)   . Carotid stenosis   . Torn rotator cuff     hx of. left   . History of PFTs     obstructive/restrictive. There is some reversibility with bronchodilator. The patient actually never smoked. He is on Spiriva  . Cancer     melanoma  . Altered taste   . Pain     back  . COPD (chronic obstructive pulmonary disease)   . Melanoma 09/2010    pT2a N0 M0; clark level IV.   Marland Kitchen Range of motion deficit 09/19/2011  . Myocardial infarction   . Diabetes mellitus   .  Irregular heart beat     Past Surgical History  Procedure Laterality Date  . Tonsillectomy  1951  . Arterial bypass surgry    . Melanoma excision    . Blood clots removed    . Application of wound vac    . Coronary artery bypass graft  07/2006    Current Outpatient Prescriptions  Medication Sig Dispense Refill  . acetaminophen (TYLENOL) 325 MG tablet Take 650 mg by mouth every 6 (six) hours as needed.      Marland Kitchen amitriptyline (ELAVIL) 10 MG tablet Take 20 mg by mouth at bedtime. 2 tablets      . amLODipine (NORVASC) 10 MG tablet Take 10 mg by mouth daily.       Marland Kitchen aspirin 81 MG tablet Take 81 mg by mouth daily.       Marland Kitchen atorvastatin (LIPITOR) 40 MG tablet Take 40 mg by mouth daily.      . benazepril (LOTENSIN) 40 MG tablet Take 40 mg by mouth daily.       . carvedilol (COREG) 25 MG tablet Take 1 tablet (25 mg total) by mouth 2 (two) times daily.      . chlorthalidone (HYGROTON) 25 MG tablet Take 1 tablet (25 mg total) by mouth daily.  90 tablet  3  . clopidogrel (PLAVIX) 75 MG tablet TAKE ONE TABLET BY MOUTH  EVERY DAY  30 tablet  6  . Coenzyme Q10 (CO Q 10) 100 MG CAPS Take 200 mg by mouth daily.       Tery Sanfilippo Sodium (COLACE PO) Take by mouth 2 (two) times daily.      . fish oil-omega-3 fatty acids 1000 MG capsule Take 2 g by mouth daily. 1200 MG every morning and night      . glipiZIDE (GLUCOTROL) 10 MG tablet Take 10 mg by mouth 2 (two) times daily before a meal.      . Multiple Vitamin (MULTIVITAMIN) tablet Take 1 tablet by mouth daily.        . potassium chloride SA (K-DUR,KLOR-CON) 20 MEQ tablet Take 1 tablet (20 mEq total) by mouth 2 (two) times daily.  180 tablet  3  . salsalate (DISALCID) 500 MG tablet Take 500 mg by mouth 3 (three) times daily as needed.       . sildenafil (VIAGRA) 50 MG tablet DO NOT USE NITROGYLCERIN WITHIN 24 HOURS OF TAKING VIAGRA  10 tablet  1  . TEKTURNA 150 MG tablet TAKE ONE TABLET BY MOUTH EVERY DAY  30 tablet  6  . tiotropium (SPIRIVA) 18 MCG  inhalation capsule Place 18 mcg into inhaler and inhale daily.        . traZODone (DESYREL) 100 MG tablet Take 100 mg by mouth at bedtime.        No current facility-administered medications for this visit.    ALLERGIES:  is allergic to zocor.  REVIEW OF SYSTEMS:  The rest of the 14-point review of system was negative.   Filed Vitals:   09/18/12 1501  BP: 141/76  Pulse: 61  Temp: 97.6 F (36.4 C)  Resp: 18   Wt Readings from Last 3 Encounters:  09/18/12 216 lb 3.2 oz (98.068 kg)  05/25/12 214 lb (97.07 kg)  05/25/12 214 lb (97.07 kg)   ECOG Performance status: 1  PHYSICAL EXAMINATION:   General:  well-nourished in no acute distress.  Eyes:  no scleral icterus.  ENT:  There were no oropharyngeal lesions.  Neck was without thyromegaly.  Lymphatics:  Negative cervical, supraclavicular or axillary adenopathy.  Respiratory: lungs were clear bilaterally without wheezing or crackles.  Cardiovascular:  Regular rate and rhythm, S1/S2, without murmur, rub or gallop.  There was no pedal edema.  GI:  abdomen was soft, flat, nontender, nondistended, without organomegaly.  Muscoloskeletal:  no spinal tenderness of palpation of vertebral spine.  Skin exam was without echymosis, petichae.  Complete skin exam showed numerous seborrheic keratosis lesions on his back and chest.  There were two small dark lesions in the posterior calves.  Each measured about 3mm.  The one in the left calf had a dark [name] without ulceration.   There were there a year ago and were stable.  Neuro exam was nonfocal.  Patient was able to get on and off exam table without assistance.  Gait was normal.  Patient was alerted and oriented.  Attention was good.   Language was appropriate.  Mood was normal without depression.  Speech was not pressured.  Thought content was not tangential.      LABORATORY/RADIOLOGY DATA:  Lab Results  Component Value Date   WBC 7.6 09/18/2012   HGB 15.1 09/18/2012   HCT 43.5 09/18/2012   PLT 158  09/18/2012   GLUCOSE 105* 09/18/2012   CHOL 106 08/25/2012   TRIG 126.0 08/25/2012   HDL 21.20* 08/25/2012   LDLCALC 60 08/25/2012  ALKPHOS 50 09/18/2012   ALT 20 09/18/2012   AST 19 09/18/2012   NA 138 09/18/2012   K 3.7 09/18/2012   CL 101 09/18/2012   CREATININE 1.2 09/18/2012   BUN 18.2 09/18/2012   CO2 28 09/18/2012   PSA 2.61 09/16/2007   INR 1.36 10/29/2010   MICROALBUR 0.55 07/17/2009    ASSESSMENT AND PLAN:   1. Diabetes mellitus, type II:  He is on glipizide.  2. Hyperlipidemia:  He is on rosuvastatin.  3. Hypertension:  He is on benazepril, carvedilol, chlorthalidone.  4. History of coronary artery disease.  He is on aspirin, Plavix, beta blocker, simvastatin, ACE inhibitor.  5. Chronic obstructive pulmonary disease.  On inhalers p.r.n.  6. History of melanoma:  Continue to be in remission.  7. Followup: in about 1 year with the cancer [name] and in the interval with Dermatology.     The length of time of the face-to-face encounter was 10 minutes. More than 50% of time was spent counseling and coordination of care.

## 2012-10-07 IMAGING — CR DG CHEST 2V
2 series · 2 of 2 positions shown · non-contrast
Comparison: 09/03/2006

CLINICAL DATA: Melanoma, preop.  History of CHF, diabetes, COPD.

CHEST - 2 VIEW

[view not recorded (1 of 2)]
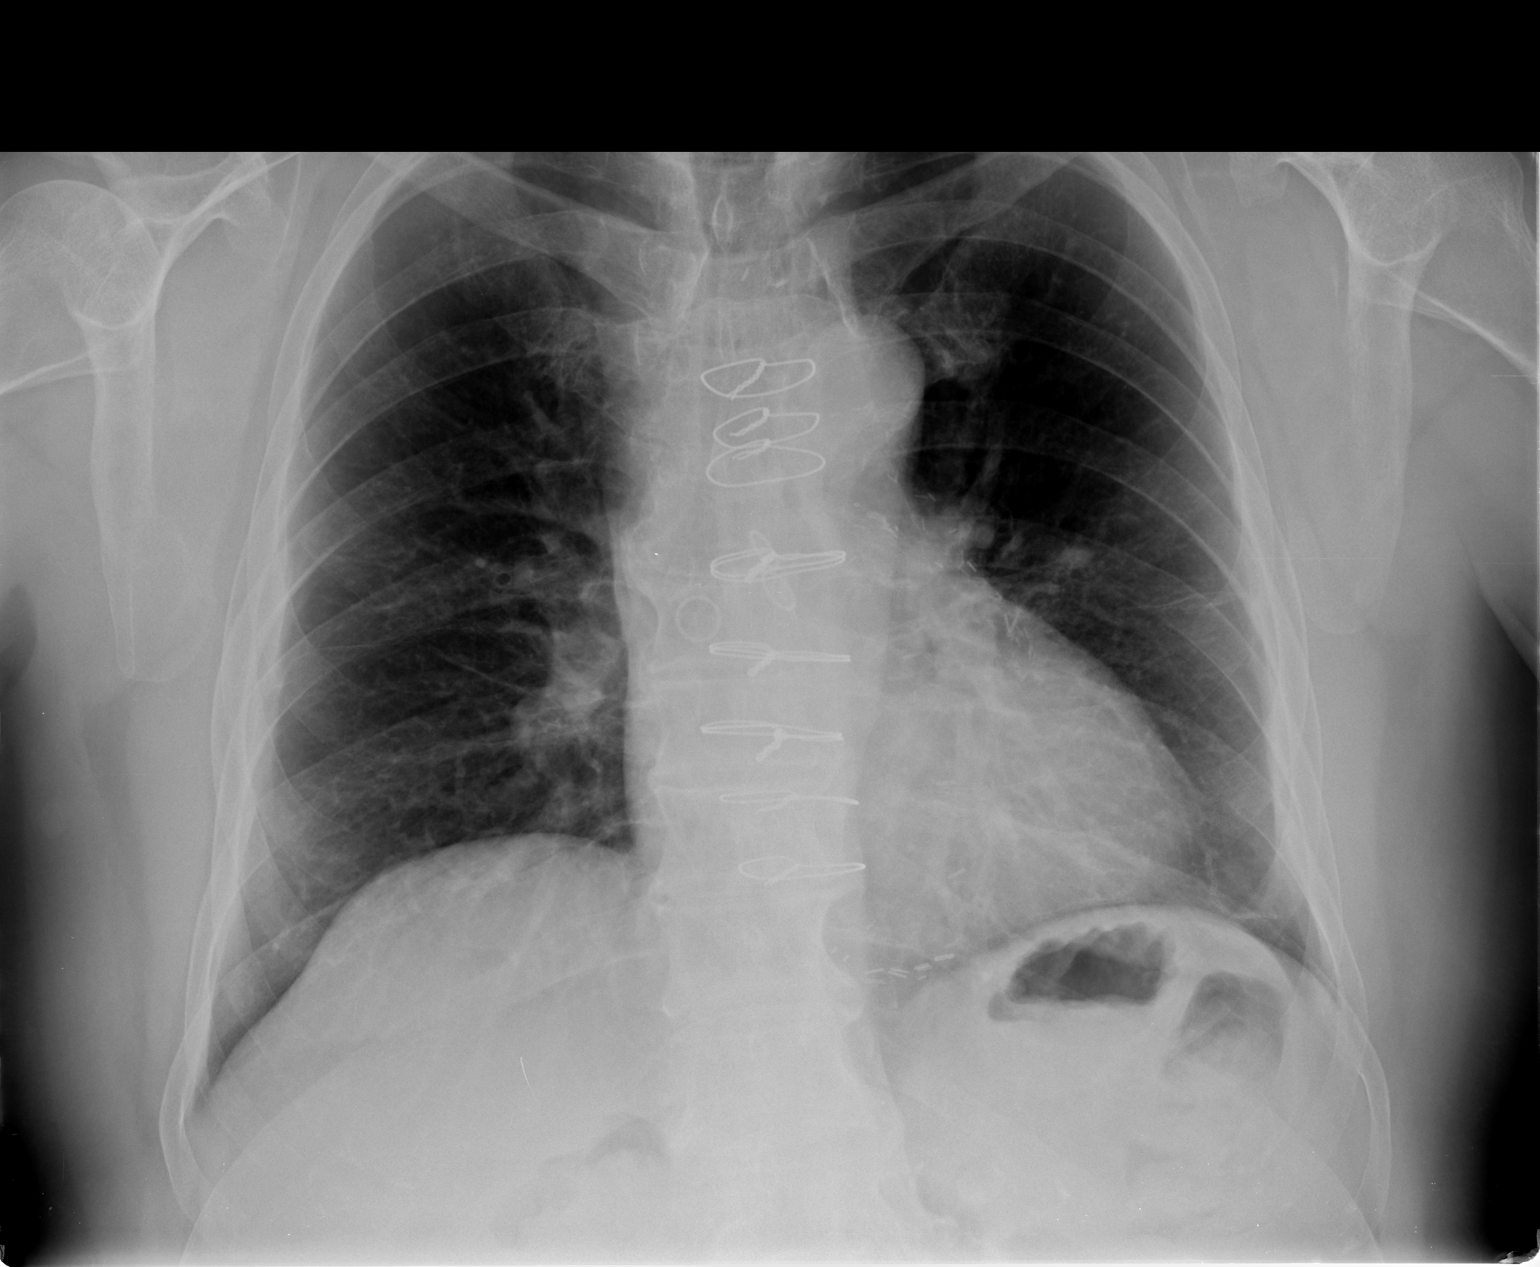

[view not recorded (2 of 2)]
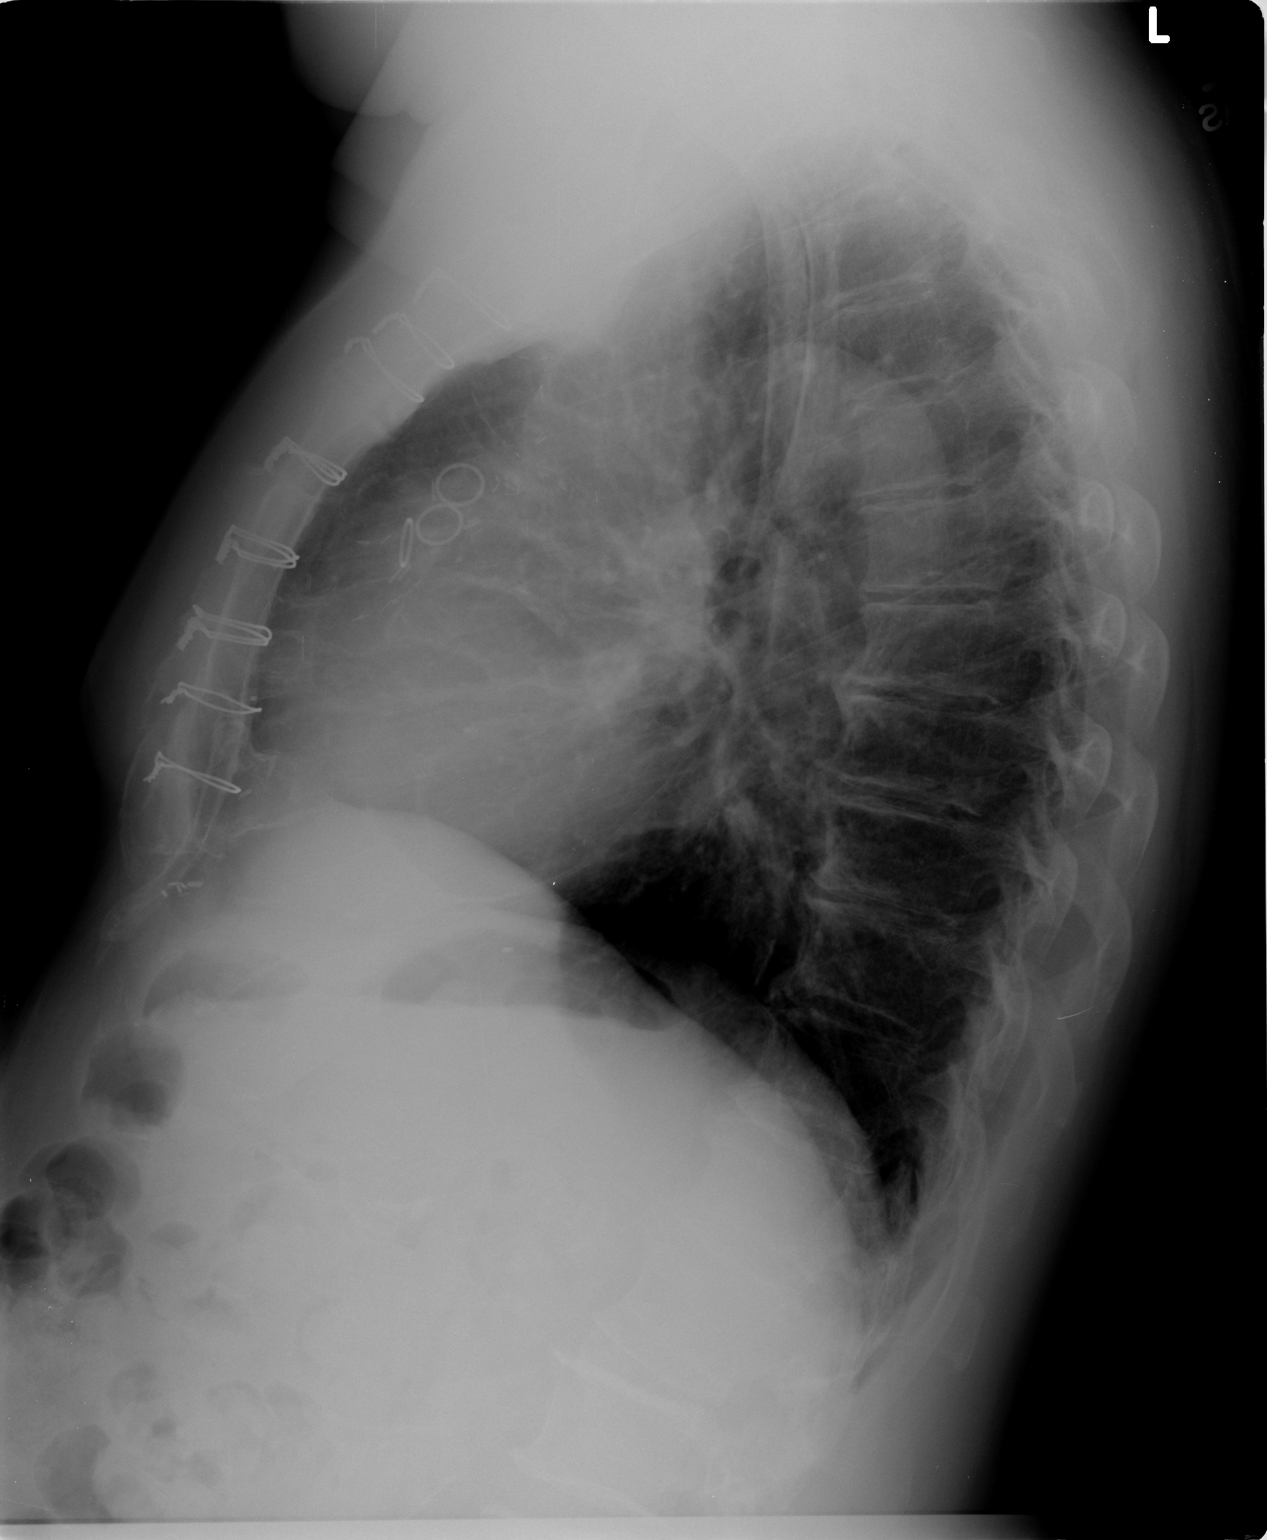

[2 of 2 positions shown; findings below may reference images not displayed]

FINDINGS: Prior CABG.  Heart is mildly enlarged.  Areas of scarring
in the lingula and lung bases.  No acute opacities or effusions.
No evidence of edema.  Degenerative changes in the thoracic spine.
IMPRESSION: Cardiomegaly.  Lingular and bibasilar scarring.

## 2012-11-24 ENCOUNTER — Other Ambulatory Visit: Payer: Self-pay | Admitting: Surgery

## 2012-11-24 LAB — BUN: BUN: 19 mg/dL (ref 6–23)

## 2012-11-27 ENCOUNTER — Encounter: Payer: Self-pay | Admitting: Surgery

## 2012-11-30 ENCOUNTER — Ambulatory Visit
Admission: RE | Admit: 2012-11-30 | Discharge: 2012-11-30 | Disposition: A | Discharge status: Home / Self Care | Payer: Medicare Other | Source: Ambulatory Visit | Attending: Surgery | Admitting: Surgery

## 2012-11-30 ENCOUNTER — Ambulatory Visit (INDEPENDENT_AMBULATORY_CARE_PROVIDER_SITE_OTHER): Payer: Medicare Other | Admitting: Surgery

## 2012-11-30 ENCOUNTER — Encounter: Payer: Self-pay | Admitting: Surgery

## 2012-11-30 VITALS — BP 149/75 | HR 61 | Ht 72.0 in | Wt 212.4 lb

## 2012-11-30 DIAGNOSIS — I714 Abdominal aortic aneurysm, without rupture: Secondary | ICD-10-CM

## 2012-11-30 MED ORDER — IOHEXOL 350 MG/ML SOLN
100.0000 mL | Freq: Once | INTRAVENOUS | Status: AC | PRN
  Administered 2012-11-30: 100 mL via INTRAVENOUS

## 2012-11-30 NOTE — Progress Notes (Signed)
[facility] [place]   Patient name: Mitchell [name] MRN: [id number] DOB: Jul 31, 1945 Sex: male     Chief Complaint  Patient presents with  . AAA    6 month f/u w/ CTA - pt has no complaints    HISTORY OF PRESENT ILLNESS: The patient is back today for followup of his abdominal aortic aneurysm. He reports occasional periumbilical abdominal pain. Otherwise there has been no changes in his symptoms. He is low concern about some discoloration on his left leg.  The patient continues to be medically managed for his hypercholesterolemia with a statin. He is on oral medication for diabetes. He is medically managed for his hypertension. He is on dual agent antiplatelet therapy.  Past Medical History  Diagnosis Date  . CAD (coronary artery disease)     patient had non-ST-elevation MI in February 2008 and there was an 80-90% proximal LAD stenosis, 90% proximal first diagonal stenosis, 99% ostial ramus stenosis. The first obtuse marginal was subtotally occluded and there was an 80% mid RCA stenosis. EF was estimated 25% on ventriculogram, coronary artery bypass grafting was done with a LIMA to the LAD, sequential saphenous vein graft to the PDA,  . Aortic insufficiency   . AAA (abdominal aortic aneurysm)     ultrasound done in 3/11 and showed AAA 3.9 x 3.9cm  . HTN (hypertension)   . DM2 (diabetes mellitus, type 2)   . Carotid stenosis   . Torn rotator cuff     hx of. left   . History of PFTs     obstructive/restrictive. There is some reversibility with bronchodilator. The patient actually never smoked. He is on Spiriva  . Cancer     melanoma  . Altered taste   . Pain     back  . COPD (chronic obstructive pulmonary disease)   . Melanoma 09/2010    pT2a N0 M0; clark level IV.   Marland Kitchen Range of motion deficit 09/19/2011  . Myocardial infarction   . Diabetes mellitus   . Irregular heart beat     Past Surgical History  Procedure Laterality Date  . Tonsillectomy  1951  .  Arterial bypass surgry    . Melanoma excision    . Blood clots removed    . Application of wound vac    . Coronary artery bypass graft  07/2006    History   Social History  . Marital Status: Divorced    Spouse Name: N/A    Number of Children: N/A  . Years of Education: N/A   Occupational History  . Not on file.   Social History Main Topics  . Smoking status: Never Smoker   . Smokeless tobacco: Never Used  . Alcohol Use: No  . Drug Use: No  . Sexually Active: Not on file   Other Topics Concern  . Not on file   Social History Narrative  . No narrative on file    Family History  Problem Relation Age of Onset  . Heart disease Mother   . Hyperlipidemia Mother   . Hypertension Mother   . Hypertension Father   . Other Father     AAA    Allergies as of 11/30/2012 - Review Complete 11/30/2012  Allergen Reaction Noted  . Zocor (simvastatin) Other (See Comments) 09/18/2012    Current Outpatient Prescriptions on File Prior to Visit  Medication Sig Dispense Refill  . acetaminophen (TYLENOL) 325 MG tablet Take 650 mg by mouth every 6 (six) hours as  needed.      Marland Kitchen amitriptyline (ELAVIL) 10 MG tablet Take 20 mg by mouth at bedtime. 2 tablets      . amLODipine (NORVASC) 10 MG tablet Take 10 mg by mouth daily.       Marland Kitchen aspirin 81 MG tablet Take 81 mg by mouth daily.       Marland Kitchen atorvastatin (LIPITOR) 40 MG tablet Take 40 mg by mouth daily.      . benazepril (LOTENSIN) 40 MG tablet Take 40 mg by mouth daily.       . carvedilol (COREG) 25 MG tablet Take 1 tablet (25 mg total) by mouth 2 (two) times daily.      . clopidogrel (PLAVIX) 75 MG tablet TAKE ONE TABLET BY MOUTH EVERY DAY  30 tablet  6  . Coenzyme Q10 (CO Q 10) 100 MG CAPS Take 200 mg by mouth daily.       Tery Sanfilippo Sodium (COLACE PO) Take by mouth 2 (two) times daily.      . fish oil-omega-3 fatty acids 1000 MG capsule Take 2 g by mouth daily. 1200 MG every morning and night      . glipiZIDE (GLUCOTROL) 10 MG tablet Take  10 mg by mouth daily.       . Multiple Vitamin (MULTIVITAMIN) tablet Take 1 tablet by mouth daily.        . salsalate (DISALCID) 500 MG tablet Take 500 mg by mouth 3 (three) times daily as needed.       . TEKTURNA 150 MG tablet TAKE ONE TABLET BY MOUTH EVERY DAY  30 tablet  6  . tiotropium (SPIRIVA) 18 MCG inhalation capsule Place 18 mcg into inhaler and inhale daily.        . traZODone (DESYREL) 100 MG tablet Take 100 mg by mouth at bedtime.       . chlorthalidone (HYGROTON) 25 MG tablet Take 1 tablet (25 mg total) by mouth daily.  90 tablet  3  . potassium chloride SA (K-DUR,KLOR-CON) 20 MEQ tablet Take 1 tablet (20 mEq total) by mouth 2 (two) times daily.  180 tablet  3  . sildenafil (VIAGRA) 50 MG tablet DO NOT USE NITROGYLCERIN WITHIN 24 HOURS OF TAKING VIAGRA  10 tablet  1   No current facility-administered medications on file prior to visit.     REVIEW OF SYSTEMS: No changes from prior visit  PHYSICAL EXAMINATION:   Vital signs are BP 149/75  Pulse 61  Ht 6' (1.829 m)  Wt 212 lb 6.4 oz (96.344 kg)  BMI 28.8 kg/m2  SpO2 98% General: The patient appears their stated age. HEENT:  No gross abnormalities Pulmonary:  Non labored breathing Abdomen: Soft and non-tender Musculoskeletal: There are no major deformities. Neurologic: No focal weakness or paresthesias are detected, Skin: There are no ulcer or rashes noted. Psychiatric: The patient has normal affect. Cardiovascular: There is a regular rate and rhythm without significant murmur appreciated. No carotid bruits   Diagnostic Studies CT angiogram was reviewed today. There appears to be an increase in the size of his aneurysm. By my measurement time getting a maximum diameter of 5.5 cm  Assessment: Abdominal aortic aneurysm Plan: I have reviewed the images with the patient. There has been a significant increase in the size of his aneurysm. I told him that I feel it is time to have this repaired to prevent rupture. We  discussed that there are multiple potential side effects from the operation but that there risk of  recurrence is relatively low. When compared to his risk for rupture, I feel repair is recommended. He wants to get this done as soon as possible now. He has been scheduled for Friday, June 27. He will need to be off of his Plavix 5 days prior to the operation.  Jorge Ny, M.D. [facility] [place] Office: [phone] Pager:  [phone]

## 2012-12-01 ENCOUNTER — Encounter: Payer: Self-pay | Admitting: Cardiology

## 2012-12-01 ENCOUNTER — Other Ambulatory Visit: Payer: Self-pay | Admitting: *Deleted

## 2012-12-01 ENCOUNTER — Ambulatory Visit (INDEPENDENT_AMBULATORY_CARE_PROVIDER_SITE_OTHER): Payer: Medicare Other | Admitting: Cardiology

## 2012-12-01 VITALS — BP 124/70 | HR 49 | Ht 72.0 in | Wt 216.0 lb

## 2012-12-01 DIAGNOSIS — I2510 Atherosclerotic heart disease of native coronary artery without angina pectoris: Secondary | ICD-10-CM

## 2012-12-01 DIAGNOSIS — I5032 Chronic diastolic (congestive) heart failure: Secondary | ICD-10-CM

## 2012-12-01 DIAGNOSIS — I6529 Occlusion and stenosis of unspecified carotid artery: Secondary | ICD-10-CM

## 2012-12-01 DIAGNOSIS — I10 Essential (primary) hypertension: Secondary | ICD-10-CM

## 2012-12-01 DIAGNOSIS — I25810 Atherosclerosis of coronary artery bypass graft(s) without angina pectoris: Secondary | ICD-10-CM

## 2012-12-01 DIAGNOSIS — I714 Abdominal aortic aneurysm, without rupture, unspecified: Secondary | ICD-10-CM

## 2012-12-01 DIAGNOSIS — E785 Hyperlipidemia, unspecified: Secondary | ICD-10-CM

## 2012-12-01 NOTE — Patient Instructions (Addendum)
Your physician wants you to follow-up in: 6 months with Dr Shirlee Latch. (December 2014). You will receive a reminder letter in the mail two months in advance. If you don't receive a letter, please call our office to schedule the follow-up appointment.

## 2012-12-02 ENCOUNTER — Encounter (HOSPITAL_COMMUNITY): Payer: Self-pay | Admitting: Pharmacy Technician

## 2012-12-02 NOTE — Progress Notes (Signed)
Patient ID: Mitchell [name], male   DOB: July 20, 1945, 67 y.o.   MRN: [id number] PCP: Dr. Jeannetta [name]  67 yo with h/o CAD s/p CABG, aortic aneurysm, HTN, DM2 returns to cardiology clinic.  He denies chest pain.  No exertional dyspnea.  BP is well-controlled now, running 115-120 systolic at home.  Most recent CTA in 6/14 showed that saccular AAA had grown to 5.3 cm.  Dr. Myra [name] is planning on placing a stent graft.   Labs (8/10): K 3.8, creatinine 1.12, LDL 61, HDL 29 Labs (3/11): HDL 32, LDL 55, LFTs normal Labs (84/69): LDL 55, HDL 27, K 3.9, creatinine 1.0 Labs (4/12): LDL 53, HDL 27 Labs (10/12): LDL 49, HDL 39 Labs (4/13): LDL 42, HDL 31 Labs (10/13): K 3.7, creatinine 1.3 Labs (3/14): LDL 60, HDL 21 Labs (4/14): K 3.7, creatinine 1.2  Allergies (verified):  No Known Drug Allergies  Past Medical History: 1. Coronary artery disease.  The patient had non-ST-elevation MI in February 2008 and there was an 80-90% proximal LAD stenosis, 90%proximal first diagonal stenosis, 99% ostial ramus stenosis.  The first obtuse marginal was subtotally occluded and there was an 80% mid RCA stenosis.  EF was estimated 25% on ventriculogram, coronary artery bypass grafting was done with a LIMA to the LAD, sequential saphenous vein graft to the PDA, and PLOM, saphenous graft to the diagonal, saphenous vein graft to the ramus. 2. Aortic insufficiency: Echo (3/11): EF 55-60%, moderate diastolic dysfunction, moderate aortic insufficiency, moderate LAE.  Echo (3/12) with EF 50-55%, grade II diastolic dysfunction, mild MR, mild aortic insufficiency.  3. Infrarenal AAA. Abdominal US 3/11 with AAA 3.9 x 3.9 cm.  Abdominal US 3/12 with AAA 3.8 x 3.9 cm.  Abdominal US (3/13) with AAA 4.6 x 3.9 with complex plaque within the AAA.  CTA abdomen showed 4 cm saccular infrarenal aneurysm.  CTA abdomen 5/14 showed saccular AAA grown to 5.3 cm.  4. Hypertension: Renal artery dopplers in 5/13 with no significant renal artery stenosis.   5. Type 2 diabetes. 6. History of torn left rotator cuff. 7. History of obstructive/restrictive PFTs.  There is some reversibility with bronchodilator.  The patient actually never smoked.  He is on Spiriva. 8. Holter (3/10):  Frequent PACs and blocked PACs.  PVCs with trigeminy.  No long pauses.  Slowest HR in 40s while asleep.  9. Carotid stenosis.  Carotid dopplers (3/12) with stable 40-59% bilateral ICA stenosis.  Carotid dopplers (3/13) with 40-59% bilateral stenosis. Carotid dopplers (3/14) with 40-59% bilateral stenosis.  10.  Melanoma: s/p excision from back 5/12.   Family History: Both the patient's mother and father had abdominal aortic aneurysm and both died due to complications with attempted AAA repair.  Social History: The patient is a lifetime nonsmoker. He is unemployed but draws a pension from the [employer].  He lives by himself.  He has no close family left, he only has cousins. He plays the piano in church.    Current Outpatient Prescriptions  Medication Sig Dispense Refill  . acetaminophen (TYLENOL) 325 MG tablet Take 650 mg by mouth every 6 (six) hours as needed.      Marland Kitchen amitriptyline (ELAVIL) 10 MG tablet Take 20 mg by mouth at bedtime. 2 tablets      . amLODipine (NORVASC) 10 MG tablet Take 10 mg by mouth daily.       Marland Kitchen aspirin 81 MG tablet Take 81 mg by mouth daily.       Marland Kitchen atorvastatin (LIPITOR) 40 MG  tablet Take 40 mg by mouth daily.      . benazepril (LOTENSIN) 40 MG tablet Take 40 mg by mouth daily.       . carvedilol (COREG) 25 MG tablet Take 1 tablet (25 mg total) by mouth 2 (two) times daily.      . chlorthalidone (HYGROTON) 25 MG tablet Take 1 tablet (25 mg total) by mouth daily.  90 tablet  3  . clopidogrel (PLAVIX) 75 MG tablet TAKE ONE TABLET BY MOUTH EVERY DAY  30 tablet  6  . Coenzyme Q10 (CO Q 10) 100 MG CAPS Take 200 mg by mouth daily.       Mitchell [name] Sodium (COLACE PO) Take by mouth 2 (two) times daily.      . fish oil-omega-3 fatty acids 1000 MG capsule  Take 2 g by mouth daily. 1200 MG every morning and night      . glipiZIDE (GLUCOTROL) 10 MG tablet Take 10 mg by mouth daily.       . Multiple Vitamin (MULTIVITAMIN) tablet Take 1 tablet by mouth daily.        . potassium chloride SA (K-DUR,KLOR-CON) 20 MEQ tablet Take 1 tablet (20 mEq total) by mouth 2 (two) times daily.  180 tablet  3  . salsalate (DISALCID) 500 MG tablet Take 500 mg by mouth 3 (three) times daily as needed.       . sildenafil (VIAGRA) 50 MG tablet as needed. DO NOT USE NITROGYLCERIN WITHIN 24 HOURS OF TAKING VIAGRA      . TEKTURNA 150 MG tablet TAKE ONE TABLET BY MOUTH EVERY DAY  30 tablet  6  . tiotropium (SPIRIVA) 18 MCG inhalation capsule Place 18 mcg into inhaler and inhale daily.        . traZODone (DESYREL) 100 MG tablet Take 100 mg by mouth at bedtime.        No current facility-administered medications for this visit.     BP 124/70  Pulse 49  Ht 6' (1.829 m)  Wt 216 lb (97.977 kg)  BMI 29.29 kg/m2 General:  Well developed, well nourished, in no acute distress. Neck:  Neck supple, no JVD. No masses, thyromegaly or abnormal cervical nodes. Lungs:  Clear bilaterally to auscultation. Heart:  Non-displaced PMI, chest non-tender; regular rate and rhythm, S1, S2 without murmurs, rubs. +S4. Carotid upstroke normal, no bruit. Pedals normal pulses. Trace bilateral ankle edema Abdomen:  Bowel sounds positive; abdomen soft and non-tender without masses, organomegaly, or hernias noted. No hepatosplenomegaly. Extremities:  No clubbing or cyanosis. Neurologic:  Alert and oriented x 3. Psych:  Normal affect.  Assessment/Plan:  CORONARY ARTERY DISEASE Stable with no ischemic symptoms. Continue ASA, Plavix, statin, Coreg, ACEI.  HYPERLIPIDEMIA Good LDL on atorvastatin.  AAA  Saccular infrarenal AAA.  Planned for repair via stent graft.  May hold Plavix as needed prior to procedure.  CAROTID ARTERY STENOSIS  Stable moderate carotid stenosis. Repeat carotid dopplers in  3/15.  AORTIC INSUFFICIENCY  Only mild on last echo. May be improved due to better BP control.  HYPERTENSION BP better controlled.  No renal artery stenosis on dopplers in 5/13.  CHRONIC DIASTOLIC HEART FAILURE Minimal dyspnea, no volume overload.   Marca Ancona 12/02/2012

## 2012-12-07 ENCOUNTER — Encounter: Payer: Self-pay | Admitting: Surgery

## 2012-12-09 ENCOUNTER — Encounter (HOSPITAL_COMMUNITY)
Admission: RE | Admit: 2012-12-09 | Discharge: 2012-12-09 | Disposition: A | Discharge status: Home / Self Care | Payer: Medicare Other | Source: Ambulatory Visit | Attending: Surgery | Admitting: Surgery

## 2012-12-09 ENCOUNTER — Encounter (HOSPITAL_COMMUNITY)
Admission: RE | Admit: 2012-12-09 | Discharge: 2012-12-09 | Disposition: A | Discharge status: Home / Self Care | Payer: Medicare Other | Source: Ambulatory Visit | Attending: Anesthesiology | Admitting: Anesthesiology

## 2012-12-09 ENCOUNTER — Encounter (HOSPITAL_COMMUNITY): Payer: Self-pay

## 2012-12-09 HISTORY — DX: Anxiety disorder, unspecified: F41.9

## 2012-12-09 HISTORY — DX: Depression, unspecified: F32.A

## 2012-12-09 HISTORY — DX: Major depressive disorder, single episode, unspecified: F32.9

## 2012-12-09 HISTORY — DX: Shortness of breath: R06.02

## 2012-12-09 LAB — COMPREHENSIVE METABOLIC PANEL
AST: 21 U/L (ref 0–37)
Albumin: 3.8 g/dL (ref 3.5–5.2)
Alkaline Phosphatase: 55 U/L (ref 39–117)
BUN: 15 mg/dL (ref 6–23)
CO2: 24 mEq/L (ref 19–32)
Chloride: 101 mEq/L (ref 96–112)
GFR calc non Af Amer: 85 mL/min — ABNORMAL LOW (ref >90)
Potassium: 3.6 mEq/L (ref 3.5–5.1)
Total Bilirubin: 0.3 mg/dL (ref 0.3–1.2)

## 2012-12-09 LAB — CBC
HCT: 41.9 % (ref 39.0–52.0)
MCV: 90.7 fL (ref 78.0–100.0)
RBC: 4.62 MIL/uL (ref 4.22–5.81)
RDW: 12.6 % (ref 11.5–15.5)
WBC: 7.3 10*3/uL (ref 4.0–10.5)

## 2012-12-09 LAB — BLOOD GAS, ARTERIAL
Acid-Base Excess: 1.1 mmol/L (ref 0.0–2.0)
Bicarbonate: 24.9 mEq/L — ABNORMAL HIGH (ref 20.0–24.0)
TCO2: 26.1 mmol/L (ref 0–100)
pCO2 arterial: 38 mmHg (ref 35.0–45.0)
pH, Arterial: 7.432 (ref 7.350–7.450)
pO2, Arterial: 101 mmHg — ABNORMAL HIGH (ref 80.0–100.0)

## 2012-12-09 LAB — URINALYSIS, ROUTINE W REFLEX MICROSCOPIC
Glucose, UA: 100 mg/dL — AB
Leukocytes, UA: NEGATIVE
Nitrite: NEGATIVE
Specific Gravity, Urine: 1.007 (ref 1.005–1.030)
pH: 6 (ref 5.0–8.0)

## 2012-12-09 LAB — PROTIME-INR: Prothrombin Time: 13.5 seconds (ref 11.6–15.2)

## 2012-12-09 LAB — TYPE AND SCREEN

## 2012-12-09 NOTE — Pre-Procedure Instructions (Signed)
MARSDEN ZAINO  12/09/2012   Your procedure is scheduled on:  12-11-2012  @7 :30 AM    Report to [facility] at 5:30AM.  Call this number if you have problems the morning of surgery: [phone]   Remember:   Do not eat food or drink liquids after midnight.    Take these medicines the morning of surgery with A SIP OF WATER: Tekturna,amlodipine,carvedilol,hygroton,spiriva pain medication as needed.   Do not wear jewelry,  Do not wear lotions, powders, or perfumes  Do not shave 48 hours prior to surgery. Men may shave face and neck.  Do not bring valuables to the hospital.  [facility] is not responsible for any belongings or valuables.  Contacts, dentures or bridgework may not be worn into surgery.  Leave suitcase in the car. After surgery it may be brought to your room.   For patients admitted to the hospital, checkout time is 11:00 AM the day of discharge.   Patients discharged the day of surgery will not be allowed to drive home.    Special Instructions: Shower using CHG 2 nights before surgery and the night before surgery.  If you shower the day of surgery use CHG.  Use special wash - you have one bottle of CHG for all showers.  You should use approximately 1/3 of the bottle for each shower.   Please read over the following fact sheets that you were given: Pain Booklet, Coughing and Deep Breathing, Blood Transfusion Information and Surgical Site Infection Prevention

## 2012-12-10 MED ORDER — DEXTROSE 5 % IV SOLN
1.5000 g | INTRAVENOUS | Status: AC
  Administered 2012-12-11: 1.5 g via INTRAVENOUS
  Filled 2012-12-10: qty 1.5

## 2012-12-11 ENCOUNTER — Encounter (HOSPITAL_COMMUNITY)
Admission: RE | Disposition: A | Discharge status: Home / Self Care | Payer: Self-pay | Source: Ambulatory Visit | Attending: Surgery

## 2012-12-11 ENCOUNTER — Encounter (HOSPITAL_COMMUNITY): Payer: Self-pay | Admitting: Anesthesiology

## 2012-12-11 ENCOUNTER — Other Ambulatory Visit: Payer: Self-pay | Admitting: *Deleted

## 2012-12-11 ENCOUNTER — Inpatient Hospital Stay (HOSPITAL_COMMUNITY): Payer: Medicare Other | Admitting: Anesthesiology

## 2012-12-11 ENCOUNTER — Inpatient Hospital Stay (HOSPITAL_COMMUNITY)
Admission: RE | Admit: 2012-12-11 | Discharge: 2012-12-12 | DRG: 238 | Disposition: A | Discharge status: Home / Self Care | Payer: Medicare Other | Source: Ambulatory Visit | Attending: Surgery | Admitting: Surgery

## 2012-12-11 ENCOUNTER — Inpatient Hospital Stay (HOSPITAL_COMMUNITY): Payer: Medicare Other

## 2012-12-11 ENCOUNTER — Encounter (HOSPITAL_COMMUNITY): Payer: Self-pay | Admitting: *Deleted

## 2012-12-11 DIAGNOSIS — Z79899 Other long term (current) drug therapy: Secondary | ICD-10-CM

## 2012-12-11 DIAGNOSIS — I714 Abdominal aortic aneurysm, without rupture, unspecified: Principal | ICD-10-CM | POA: Diagnosis present

## 2012-12-11 DIAGNOSIS — J4489 Other specified chronic obstructive pulmonary disease: Secondary | ICD-10-CM | POA: Diagnosis present

## 2012-12-11 DIAGNOSIS — I359 Nonrheumatic aortic valve disorder, unspecified: Secondary | ICD-10-CM | POA: Diagnosis present

## 2012-12-11 DIAGNOSIS — E119 Type 2 diabetes mellitus without complications: Secondary | ICD-10-CM | POA: Diagnosis present

## 2012-12-11 DIAGNOSIS — I10 Essential (primary) hypertension: Secondary | ICD-10-CM | POA: Diagnosis present

## 2012-12-11 DIAGNOSIS — E7800 Pure hypercholesterolemia, unspecified: Secondary | ICD-10-CM | POA: Diagnosis present

## 2012-12-11 DIAGNOSIS — Z7982 Long term (current) use of aspirin: Secondary | ICD-10-CM

## 2012-12-11 DIAGNOSIS — Z48812 Encounter for surgical aftercare following surgery on the circulatory system: Secondary | ICD-10-CM

## 2012-12-11 DIAGNOSIS — J449 Chronic obstructive pulmonary disease, unspecified: Secondary | ICD-10-CM | POA: Diagnosis present

## 2012-12-11 DIAGNOSIS — I252 Old myocardial infarction: Secondary | ICD-10-CM

## 2012-12-11 HISTORY — PX: ABDOMINAL AORTIC ENDOVASCULAR STENT GRAFT: SHX5707

## 2012-12-11 LAB — CBC
HCT: 37.7 % — ABNORMAL LOW (ref 39.0–52.0)
Platelets: 135 10*3/uL — ABNORMAL LOW (ref 150–400)
RDW: 12.8 % (ref 11.5–15.5)
WBC: 6.9 10*3/uL (ref 4.0–10.5)

## 2012-12-11 LAB — GLUCOSE, CAPILLARY: Glucose-Capillary: 177 mg/dL — ABNORMAL HIGH (ref 70–99)

## 2012-12-11 LAB — BASIC METABOLIC PANEL
BUN: 15 mg/dL (ref 6–23)
Chloride: 105 mEq/L (ref 96–112)
GFR calc Af Amer: 80 mL/min — ABNORMAL LOW (ref >90)
Potassium: 3.8 mEq/L (ref 3.5–5.1)

## 2012-12-11 LAB — APTT: aPTT: 28 seconds (ref 24–37)

## 2012-12-11 LAB — PROTIME-INR: Prothrombin Time: 13.6 seconds (ref 11.6–15.2)

## 2012-12-11 LAB — MAGNESIUM: Magnesium: 2.1 mg/dL (ref 1.5–2.5)

## 2012-12-11 SURGERY — INSERTION, ENDOVASCULAR STENT GRAFT, AORTA, ABDOMINAL
Anesthesia: General | Site: Abdomen | Wound class: Clean

## 2012-12-11 MED ORDER — ASPIRIN 81 MG PO CHEW
81.0000 mg | CHEWABLE_TABLET | Freq: Every day | ORAL | Status: DC
  Administered 2012-12-11 – 2012-12-12 (×2): 81 mg via ORAL
  Filled 2012-12-11 (×2): qty 1

## 2012-12-11 MED ORDER — MORPHINE SULFATE 2 MG/ML IJ SOLN
2.0000 mg | INTRAMUSCULAR | Status: DC | PRN
  Administered 2012-12-11 (×2): 2 mg via INTRAVENOUS
  Filled 2012-12-11 (×2): qty 1

## 2012-12-11 MED ORDER — 0.9 % SODIUM CHLORIDE (POUR BTL) OPTIME
TOPICAL | Status: DC | PRN
  Administered 2012-12-11: 1000 mL

## 2012-12-11 MED ORDER — NEOSTIGMINE METHYLSULFATE 1 MG/ML IJ SOLN
INTRAMUSCULAR | Status: DC | PRN
  Administered 2012-12-11: 3 mg via INTRAVENOUS

## 2012-12-11 MED ORDER — SODIUM CHLORIDE 0.9 % IV SOLN: INTRAVENOUS | Status: DC

## 2012-12-11 MED ORDER — GLYCOPYRROLATE 0.2 MG/ML IJ SOLN
INTRAMUSCULAR | Status: DC | PRN
  Administered 2012-12-11: .4 mg via INTRAVENOUS

## 2012-12-11 MED ORDER — OXYCODONE HCL 5 MG/5ML PO SOLN: 5.0000 mg | Freq: Once | ORAL | Status: DC | PRN

## 2012-12-11 MED ORDER — ACETAMINOPHEN 325 MG PO TABS
325.0000 mg | ORAL_TABLET | ORAL | Status: DC | PRN
  Administered 2012-12-11: 650 mg via ORAL
  Filled 2012-12-11: qty 2

## 2012-12-11 MED ORDER — HYDROMORPHONE HCL PF 1 MG/ML IJ SOLN
0.2500 mg | INTRAMUSCULAR | Status: DC | PRN
  Administered 2012-12-11 (×4): 0.25 mg via INTRAVENOUS

## 2012-12-11 MED ORDER — ALUM & MAG HYDROXIDE-SIMETH 200-200-20 MG/5ML PO SUSP
15.0000 mL | ORAL | Status: DC | PRN
Start: 2012-12-11 — End: 2012-12-12

## 2012-12-11 MED ORDER — PROTAMINE SULFATE 10 MG/ML IV SOLN
INTRAVENOUS | Status: DC | PRN
  Administered 2012-12-11: 50 mg via INTRAVENOUS

## 2012-12-11 MED ORDER — GLIPIZIDE 10 MG PO TABS
10.0000 mg | ORAL_TABLET | Freq: Every day | ORAL | Status: DC
  Administered 2012-12-12: 10 mg via ORAL
  Filled 2012-12-11 (×2): qty 1

## 2012-12-11 MED ORDER — MIDAZOLAM HCL 5 MG/5ML IJ SOLN
INTRAMUSCULAR | Status: DC | PRN
  Administered 2012-12-11: 2 mg via INTRAVENOUS

## 2012-12-11 MED ORDER — ONDANSETRON HCL 4 MG/2ML IJ SOLN
INTRAMUSCULAR | Status: DC | PRN
  Administered 2012-12-11: 4 mg via INTRAVENOUS

## 2012-12-11 MED ORDER — ALISKIREN FUMARATE 150 MG PO TABS
150.0000 mg | ORAL_TABLET | Freq: Every day | ORAL | Status: DC
  Filled 2012-12-11: qty 1

## 2012-12-11 MED ORDER — HYDROMORPHONE HCL PF 1 MG/ML IJ SOLN
INTRAMUSCULAR | Status: AC
  Filled 2012-12-11: qty 1

## 2012-12-11 MED ORDER — CLOPIDOGREL BISULFATE 75 MG PO TABS
75.0000 mg | ORAL_TABLET | Freq: Every day | ORAL | Status: DC
  Administered 2012-12-12: 75 mg via ORAL
  Filled 2012-12-11 (×2): qty 1

## 2012-12-11 MED ORDER — PHENOL 1.4 % MT LIQD: 1.0000 | OROMUCOSAL | Status: DC | PRN

## 2012-12-11 MED ORDER — POTASSIUM CHLORIDE CRYS ER 20 MEQ PO TBCR
20.0000 meq | EXTENDED_RELEASE_TABLET | Freq: Two times a day (BID) | ORAL | Status: DC
  Administered 2012-12-12: 20 meq via ORAL
  Filled 2012-12-11 (×2): qty 1

## 2012-12-11 MED ORDER — PROPOFOL 10 MG/ML IV BOLUS
INTRAVENOUS | Status: DC | PRN
  Administered 2012-12-11: 200 mg via INTRAVENOUS

## 2012-12-11 MED ORDER — LACTATED RINGERS IV SOLN
INTRAVENOUS | Status: DC | PRN
  Administered 2012-12-11: 08:00:00 via INTRAVENOUS

## 2012-12-11 MED ORDER — DOCUSATE SODIUM 100 MG PO CAPS
100.0000 mg | ORAL_CAPSULE | Freq: Every day | ORAL | Status: DC
  Administered 2012-12-12: 100 mg via ORAL
  Filled 2012-12-11: qty 1

## 2012-12-11 MED ORDER — AMITRIPTYLINE HCL 10 MG PO TABS
20.0000 mg | ORAL_TABLET | Freq: Every day | ORAL | Status: DC
Start: 2012-12-11 — End: 2012-12-12
  Administered 2012-12-11: 20 mg via ORAL
  Filled 2012-12-11 (×2): qty 2

## 2012-12-11 MED ORDER — POTASSIUM CHLORIDE CRYS ER 20 MEQ PO TBCR: 20.0000 meq | EXTENDED_RELEASE_TABLET | Freq: Once | ORAL | Status: AC | PRN

## 2012-12-11 MED ORDER — GUAIFENESIN-DM 100-10 MG/5ML PO SYRP: 15.0000 mL | ORAL_SOLUTION | ORAL | Status: DC | PRN

## 2012-12-11 MED ORDER — ARTIFICIAL TEARS OP OINT
TOPICAL_OINTMENT | OPHTHALMIC | Status: DC | PRN
  Administered 2012-12-11: 1 via OPHTHALMIC

## 2012-12-11 MED ORDER — METOPROLOL TARTRATE 1 MG/ML IV SOLN: 2.0000 mg | INTRAVENOUS | Status: DC | PRN

## 2012-12-11 MED ORDER — OXYCODONE HCL 5 MG PO TABS: 5.0000 mg | ORAL_TABLET | Freq: Once | ORAL | Status: DC | PRN

## 2012-12-11 MED ORDER — BENAZEPRIL HCL 40 MG PO TABS
40.0000 mg | ORAL_TABLET | Freq: Every day | ORAL | Status: DC
  Administered 2012-12-12: 40 mg via ORAL
  Filled 2012-12-11: qty 1

## 2012-12-11 MED ORDER — ONDANSETRON HCL 4 MG/2ML IJ SOLN: 4.0000 mg | Freq: Four times a day (QID) | INTRAMUSCULAR | Status: DC | PRN

## 2012-12-11 MED ORDER — LACTATED RINGERS IV SOLN
INTRAVENOUS | Status: DC | PRN
  Administered 2012-12-11: 07:00:00 via INTRAVENOUS

## 2012-12-11 MED ORDER — LABETALOL HCL 5 MG/ML IV SOLN: 10.0000 mg | INTRAVENOUS | Status: DC | PRN

## 2012-12-11 MED ORDER — DEXTROSE 5 % IV SOLN
1.5000 g | Freq: Two times a day (BID) | INTRAVENOUS | Status: AC
  Administered 2012-12-11 – 2012-12-12 (×2): 1.5 g via INTRAVENOUS
  Filled 2012-12-11 (×2): qty 1.5

## 2012-12-11 MED ORDER — PANTOPRAZOLE SODIUM 40 MG PO TBEC
40.0000 mg | DELAYED_RELEASE_TABLET | Freq: Every day | ORAL | Status: DC
  Administered 2012-12-11 – 2012-12-12 (×2): 40 mg via ORAL
  Filled 2012-12-11 (×2): qty 1

## 2012-12-11 MED ORDER — HEPARIN SODIUM (PORCINE) 1000 UNIT/ML IJ SOLN
INTRAMUSCULAR | Status: DC | PRN
  Administered 2012-12-11: 8000 [IU] via INTRAVENOUS

## 2012-12-11 MED ORDER — IODIXANOL 320 MG/ML IV SOLN
INTRAVENOUS | Status: DC | PRN
  Administered 2012-12-11: 150 mL via INTRA_ARTERIAL

## 2012-12-11 MED ORDER — TRAZODONE HCL 100 MG PO TABS
100.0000 mg | ORAL_TABLET | Freq: Every day | ORAL | Status: DC
  Administered 2012-12-11: 100 mg via ORAL
  Filled 2012-12-11 (×2): qty 1

## 2012-12-11 MED ORDER — ATROPINE SULFATE 0.4 MG/ML IJ SOLN
INTRAMUSCULAR | Status: DC | PRN
  Administered 2012-12-11: .4 mg via INTRAVENOUS

## 2012-12-11 MED ORDER — TIOTROPIUM BROMIDE MONOHYDRATE 18 MCG IN CAPS
18.0000 ug | ORAL_CAPSULE | Freq: Every day | RESPIRATORY_TRACT | Status: DC
  Administered 2012-12-12: 18 ug via RESPIRATORY_TRACT
  Filled 2012-12-11: qty 5

## 2012-12-11 MED ORDER — CHLORTHALIDONE 25 MG PO TABS
25.0000 mg | ORAL_TABLET | Freq: Every day | ORAL | Status: DC
  Administered 2012-12-12: 25 mg via ORAL
  Filled 2012-12-11: qty 1

## 2012-12-11 MED ORDER — FENTANYL CITRATE 0.05 MG/ML IJ SOLN
INTRAMUSCULAR | Status: DC | PRN
  Administered 2012-12-11 (×2): 50 ug via INTRAVENOUS

## 2012-12-11 MED ORDER — ATORVASTATIN CALCIUM 40 MG PO TABS
40.0000 mg | ORAL_TABLET | Freq: Every day | ORAL | Status: DC
  Administered 2012-12-11: 40 mg via ORAL
  Filled 2012-12-11 (×2): qty 1

## 2012-12-11 MED ORDER — CARVEDILOL 25 MG PO TABS
25.0000 mg | ORAL_TABLET | Freq: Two times a day (BID) | ORAL | Status: DC
  Administered 2012-12-11 – 2012-12-12 (×2): 25 mg via ORAL
  Filled 2012-12-11 (×4): qty 1

## 2012-12-11 MED ORDER — EPHEDRINE SULFATE 50 MG/ML IJ SOLN
INTRAMUSCULAR | Status: DC | PRN
  Administered 2012-12-11: 10 mg via INTRAVENOUS
  Administered 2012-12-11 (×2): 5 mg via INTRAVENOUS

## 2012-12-11 MED ORDER — OXYCODONE-ACETAMINOPHEN 5-325 MG PO TABS: 1.0000 | ORAL_TABLET | ORAL | Status: DC | PRN

## 2012-12-11 MED ORDER — ROCURONIUM BROMIDE 100 MG/10ML IV SOLN
INTRAVENOUS | Status: DC | PRN
  Administered 2012-12-11: 50 mg via INTRAVENOUS

## 2012-12-11 MED ORDER — SODIUM CHLORIDE 0.9 % IR SOLN
Status: DC | PRN
  Administered 2012-12-11: 09:00:00

## 2012-12-11 MED ORDER — INSULIN ASPART 100 UNIT/ML ~~LOC~~ SOLN
0.0000 [IU] | Freq: Three times a day (TID) | SUBCUTANEOUS | Status: DC
  Administered 2012-12-11 – 2012-12-12 (×2): 2 [IU] via SUBCUTANEOUS
  Administered 2012-12-12: 3 [IU] via SUBCUTANEOUS

## 2012-12-11 MED ORDER — HYDRALAZINE HCL 20 MG/ML IJ SOLN: 10.0000 mg | INTRAMUSCULAR | Status: DC | PRN

## 2012-12-11 MED ORDER — METOCLOPRAMIDE HCL 5 MG/ML IJ SOLN: 10.0000 mg | Freq: Once | INTRAMUSCULAR | Status: DC | PRN

## 2012-12-11 MED ORDER — SODIUM CHLORIDE 0.9 % IV SOLN
500.0000 mL | Freq: Once | INTRAVENOUS | Status: AC | PRN
  Administered 2012-12-12: 500 mL via INTRAVENOUS

## 2012-12-11 MED ORDER — ACETAMINOPHEN 650 MG RE SUPP: 325.0000 mg | RECTAL | Status: DC | PRN

## 2012-12-11 MED ORDER — AMLODIPINE BESYLATE 10 MG PO TABS
10.0000 mg | ORAL_TABLET | Freq: Every day | ORAL | Status: DC
  Administered 2012-12-12: 10 mg via ORAL
  Filled 2012-12-11: qty 1

## 2012-12-11 MED ORDER — OXYCODONE-ACETAMINOPHEN 5-325 MG PO TABS
1.0000 | ORAL_TABLET | ORAL | Status: DC | PRN
  Administered 2012-12-11: 2 via ORAL
  Administered 2012-12-12: 1 via ORAL
  Administered 2012-12-12: 2 via ORAL
  Filled 2012-12-11: qty 2
  Filled 2012-12-11 (×2): qty 1
  Filled 2012-12-11: qty 2

## 2012-12-11 MED ORDER — SODIUM CHLORIDE 0.9 % IV SOLN
INTRAVENOUS | Status: DC
  Administered 2012-12-11: 100 mL/h via INTRAVENOUS
  Administered 2012-12-11 – 2012-12-12 (×2): via INTRAVENOUS

## 2012-12-11 SURGICAL SUPPLY — 79 items
ADH SKN CLS APL DERMABOND .7 (GAUZE/BANDAGES/DRESSINGS) ×1
BAG BANDED W/RUBBER/TAPE 36X54 (MISCELLANEOUS) ×4 IMPLANT
BAG EQP BAND 135X91 W/RBR TAPE (MISCELLANEOUS) ×2
BAG SNAP BAND KOVER 36X36 (MISCELLANEOUS) ×6 IMPLANT
CANISTER SUCTION 2500CC (MISCELLANEOUS) ×2 IMPLANT
CATH BEACON 5.038 65CM KMP-01 (CATHETERS) ×1 IMPLANT
CATH OMNI FLUSH .035X70CM (CATHETERS) ×1 IMPLANT
CLIP TI MEDIUM 24 (CLIP) IMPLANT
CLIP TI WIDE RED SMALL 24 (CLIP) IMPLANT
CLOTH BEACON ORANGE TIMEOUT ST (SAFETY) ×2 IMPLANT
COVER DOME SNAP 22 D (MISCELLANEOUS) ×2 IMPLANT
COVER MAYO STAND STRL (DRAPES) ×2 IMPLANT
COVER PROBE W GEL 5X96 (DRAPES) ×2 IMPLANT
COVER SURGICAL LIGHT HANDLE (MISCELLANEOUS) ×2 IMPLANT
DERMABOND ADVANCED (GAUZE/BANDAGES/DRESSINGS) ×1
DERMABOND ADVANCED .7 DNX12 (GAUZE/BANDAGES/DRESSINGS) ×1 IMPLANT
DEVICE CLOSURE PERCLS PRGLD 6F (VASCULAR PRODUCTS) IMPLANT
DRAPE TABLE COVER HEAVY DUTY (DRAPES) ×2 IMPLANT
DRESSING OPSITE X SMALL 2X3 (GAUZE/BANDAGES/DRESSINGS) ×3 IMPLANT
DRSG TEGADERM 4X4.75 (GAUZE/BANDAGES/DRESSINGS) ×1 IMPLANT
DRYSEAL FLEXSHEATH 12FR 33CM (SHEATH) ×1
DRYSEAL FLEXSHEATH 18FR 33CM (SHEATH) ×1
ELECT REM PT RETURN 9FT ADLT (ELECTROSURGICAL) ×4
ELECTRODE REM PT RTRN 9FT ADLT (ELECTROSURGICAL) ×2 IMPLANT
EXCLUDER TRUNK (Endovascular Graft) ×1 IMPLANT
GAUZE SPONGE 2X2 8PLY STRL LF (GAUZE/BANDAGES/DRESSINGS) ×2 IMPLANT
GLOVE BIOGEL PI IND STRL 6.5 (GLOVE) IMPLANT
GLOVE BIOGEL PI IND STRL 7.0 (GLOVE) IMPLANT
GLOVE BIOGEL PI IND STRL 7.5 (GLOVE) ×1 IMPLANT
GLOVE BIOGEL PI INDICATOR 6.5 (GLOVE) ×2
GLOVE BIOGEL PI INDICATOR 7.0 (GLOVE) ×2
GLOVE BIOGEL PI INDICATOR 7.5 (GLOVE) ×1
GLOVE ECLIPSE 6.5 STRL STRAW (GLOVE) ×1 IMPLANT
GLOVE SS BIOGEL STRL SZ 6.5 (GLOVE) IMPLANT
GLOVE SUPERSENSE BIOGEL SZ 6.5 (GLOVE) ×1
GLOVE SURG SS PI 7.5 STRL IVOR (GLOVE) ×2 IMPLANT
GOWN PREVENTION PLUS XXLARGE (GOWN DISPOSABLE) ×2 IMPLANT
GOWN STRL NON-REIN LRG LVL3 (GOWN DISPOSABLE) ×6 IMPLANT
GRAFT BALLN CATH 65CM (STENTS) IMPLANT
GRAFT EXCLUDER LEG (Endovascular Graft) ×1 IMPLANT
GRAFT EXCLUDER LEG 14.5X12 (Endovascular Graft) ×1 IMPLANT
HEMOSTAT SNOW SURGICEL 2X4 (HEMOSTASIS) IMPLANT
KIT BASIN OR (CUSTOM PROCEDURE TRAY) ×2 IMPLANT
KIT ROOM TURNOVER OR (KITS) ×2 IMPLANT
NDL PERC 18GX7CM (NEEDLE) ×1 IMPLANT
NEEDLE PERC 18GX7CM (NEEDLE) ×2 IMPLANT
NS IRRIG 1000ML POUR BTL (IV SOLUTION) ×2 IMPLANT
PACK AORTA (CUSTOM PROCEDURE TRAY) ×2 IMPLANT
PAD ARMBOARD 7.5X6 YLW CONV (MISCELLANEOUS) ×4 IMPLANT
PENCIL BUTTON HOLSTER BLD 10FT (ELECTRODE) IMPLANT
PERCLOSE PROGLIDE 6F (VASCULAR PRODUCTS) ×8
PROTECTION STATION PRESSURIZED (MISCELLANEOUS) ×2
SHEATH AVANTI 11CM 8FR (MISCELLANEOUS) ×1 IMPLANT
SHEATH BRITE TIP 8FR 23CM (MISCELLANEOUS) ×1 IMPLANT
SHEATH DRYSEAL FLEX 12FR 33CM (SHEATH) IMPLANT
SHEATH DRYSEAL FLEX 18FR 33CM (SHEATH) IMPLANT
SPONGE GAUZE 2X2 STER 10/PKG (GAUZE/BANDAGES/DRESSINGS) ×1
STAPLER VISISTAT 35W (STAPLE) IMPLANT
STATION PROTECTION PRESSURIZED (MISCELLANEOUS) IMPLANT
STENT GRAFT BALLN CATH 65CM (STENTS) ×1
STOPCOCK MORSE 400PSI 3WAY (MISCELLANEOUS) ×2 IMPLANT
SUT ETHILON 3 0 PS 1 (SUTURE) IMPLANT
SUT PROLENE 5 0 C 1 24 (SUTURE) IMPLANT
SUT VIC AB 2-0 CT1 27 (SUTURE)
SUT VIC AB 2-0 CT1 TAPERPNT 27 (SUTURE) IMPLANT
SUT VIC AB 3-0 SH 27 (SUTURE)
SUT VIC AB 3-0 SH 27X BRD (SUTURE) IMPLANT
SUT VICRYL 4-0 PS2 18IN ABS (SUTURE) ×4 IMPLANT
SYR 20CC LL (SYRINGE) ×4 IMPLANT
SYR 30ML LL (SYRINGE) IMPLANT
SYR 5ML LL (SYRINGE) IMPLANT
SYR MEDRAD MARK V 150ML (SYRINGE) ×2 IMPLANT
SYRINGE 10CC LL (SYRINGE) ×6 IMPLANT
TOWEL OR 17X24 6PK STRL BLUE (TOWEL DISPOSABLE) ×4 IMPLANT
TOWEL OR 17X26 10 PK STRL BLUE (TOWEL DISPOSABLE) ×4 IMPLANT
TRAY FOLEY CATH 14FRSI W/METER (CATHETERS) ×2 IMPLANT
TUBING HIGH PRESSURE 120CM (CONNECTOR) ×2 IMPLANT
WIRE AMPLATZ SS-J .035X180CM (WIRE) ×2 IMPLANT
WIRE BENTSON .035X145CM (WIRE) ×2 IMPLANT

## 2012-12-11 NOTE — Progress Notes (Signed)
Patient received into room 3310 from PACU, alert and oriented x's 3. Complete transfer into the bed. Vitals obtained and are stable. Lft. Groin is bulging but that is due to the patient has a hernia. Patient is experiencing pain mainly on his sides. Patient has been instructed to call for the nurse for anything and not to get out of the bed.

## 2012-12-11 NOTE — H&P (View-Only) (Signed)
[facility] [place]   Patient name: Mitchell [name] MRN: [id number] DOB: [birth year] Sex: male     Chief Complaint  Patient presents with  . AAA    6 month f/u w/ CTA - pt has no complaints    HISTORY OF PRESENT ILLNESS: The patient is back today for followup of his abdominal aortic aneurysm. He reports occasional periumbilical abdominal pain. Otherwise there has been no changes in his symptoms. He is low concern about some discoloration on his left leg.  The patient continues to be medically managed for his hypercholesterolemia with a statin. He is on oral medication for diabetes. He is medically managed for his hypertension. He is on dual agent antiplatelet therapy.  Past Medical History  Diagnosis Date  . CAD (coronary artery disease)     patient had non-ST-elevation MI in February 2008 and there was an 80-90% proximal LAD stenosis, 90% proximal first diagonal stenosis, 99% ostial ramus stenosis. The first obtuse marginal was subtotally occluded and there was an 80% mid RCA stenosis. EF was estimated 25% on ventriculogram, coronary artery bypass grafting was done with a LIMA to the LAD, sequential saphenous vein graft to the PDA,  . Aortic insufficiency   . AAA (abdominal aortic aneurysm)     ultrasound done in 3/11 and showed AAA 3.9 x 3.9cm  . HTN (hypertension)   . DM2 (diabetes mellitus, type 2)   . Carotid stenosis   . Torn rotator cuff     hx of. left   . History of PFTs     obstructive/restrictive. There is some reversibility with bronchodilator. The patient actually never smoked. He is on Spiriva  . Cancer     melanoma  . Altered taste   . Pain     back  . COPD (chronic obstructive pulmonary disease)   . Melanoma 09/2010    pT2a N0 M0; clark level IV.   Marland Kitchen Range of motion deficit 09/19/2011  . Myocardial infarction   . Diabetes mellitus   . Irregular heart beat     Past Surgical History  Procedure Laterality Date  . Tonsillectomy  1951  .  Arterial bypass surgry    . Melanoma excision    . Blood clots removed    . Application of wound vac    . Coronary artery bypass graft  07/2006    History   Social History  . Marital Status: Divorced    Spouse Name: N/A    Number of Children: N/A  . Years of Education: N/A   Occupational History  . Not on file.   Social History Main Topics  . Smoking status: Never Smoker   . Smokeless tobacco: Never Used  . Alcohol Use: No  . Drug Use: No  . Sexually Active: Not on file   Other Topics Concern  . Not on file   Social History Narrative  . No narrative on file    Family History  Problem Relation Age of Onset  . Heart disease Mother   . Hyperlipidemia Mother   . Hypertension Mother   . Hypertension Father   . Other Father     AAA    Allergies as of 11/30/2012 - Review Complete 11/30/2012  Allergen Reaction Noted  . Zocor (simvastatin) Other (See Comments) 09/18/2012    Current Outpatient Prescriptions on File Prior to Visit  Medication Sig Dispense Refill  . acetaminophen (TYLENOL) 325 MG tablet Take 650 mg by mouth every 6 (six) hours as  needed.      Marland Kitchen amitriptyline (ELAVIL) 10 MG tablet Take 20 mg by mouth at bedtime. 2 tablets      . amLODipine (NORVASC) 10 MG tablet Take 10 mg by mouth daily.       Marland Kitchen aspirin 81 MG tablet Take 81 mg by mouth daily.       Marland Kitchen atorvastatin (LIPITOR) 40 MG tablet Take 40 mg by mouth daily.      . benazepril (LOTENSIN) 40 MG tablet Take 40 mg by mouth daily.       . carvedilol (COREG) 25 MG tablet Take 1 tablet (25 mg total) by mouth 2 (two) times daily.      . clopidogrel (PLAVIX) 75 MG tablet TAKE ONE TABLET BY MOUTH EVERY DAY  30 tablet  6  . Coenzyme Q10 (CO Q 10) 100 MG CAPS Take 200 mg by mouth daily.       Tery Sanfilippo Sodium (COLACE PO) Take by mouth 2 (two) times daily.      . fish oil-omega-3 fatty acids 1000 MG capsule Take 2 g by mouth daily. 1200 MG every morning and night      . glipiZIDE (GLUCOTROL) 10 MG tablet Take  10 mg by mouth daily.       . Multiple Vitamin (MULTIVITAMIN) tablet Take 1 tablet by mouth daily.        . salsalate (DISALCID) 500 MG tablet Take 500 mg by mouth 3 (three) times daily as needed.       . TEKTURNA 150 MG tablet TAKE ONE TABLET BY MOUTH EVERY DAY  30 tablet  6  . tiotropium (SPIRIVA) 18 MCG inhalation capsule Place 18 mcg into inhaler and inhale daily.        . traZODone (DESYREL) 100 MG tablet Take 100 mg by mouth at bedtime.       . chlorthalidone (HYGROTON) 25 MG tablet Take 1 tablet (25 mg total) by mouth daily.  90 tablet  3  . potassium chloride SA (K-DUR,KLOR-CON) 20 MEQ tablet Take 1 tablet (20 mEq total) by mouth 2 (two) times daily.  180 tablet  3  . sildenafil (VIAGRA) 50 MG tablet DO NOT USE NITROGYLCERIN WITHIN 24 HOURS OF TAKING VIAGRA  10 tablet  1   No current facility-administered medications on file prior to visit.     REVIEW OF SYSTEMS: No changes from prior visit  PHYSICAL EXAMINATION:   Vital signs are BP 149/75  Pulse 61  Ht 6' (1.829 m)  Wt 212 lb 6.4 oz (96.344 kg)  BMI 28.8 kg/m2  SpO2 98% General: The patient appears their stated age. HEENT:  No gross abnormalities Pulmonary:  Non labored breathing Abdomen: Soft and non-tender Musculoskeletal: There are no major deformities. Neurologic: No focal weakness or paresthesias are detected, Skin: There are no ulcer or rashes noted. Psychiatric: The patient has normal affect. Cardiovascular: There is a regular rate and rhythm without significant murmur appreciated. No carotid bruits   Diagnostic Studies CT angiogram was reviewed today. There appears to be an increase in the size of his aneurysm. By my measurement time getting a maximum diameter of 5.5 cm  Assessment: Abdominal aortic aneurysm Plan: I have reviewed the images with the patient. There has been a significant increase in the size of his aneurysm. I told him that I feel it is time to have this repaired to prevent rupture. We  discussed that there are multiple potential side effects from the operation but that there risk of  recurrence is relatively low. When compared to his risk for rupture, I feel repair is recommended. He wants to get this done as soon as possible now. He has been scheduled for Friday, June 27. He will need to be off of his Plavix 5 days prior to the operation.  Jorge Ny, M.D. [facility] [place] Office: [phone] Pager:  [phone]

## 2012-12-11 NOTE — Anesthesia Procedure Notes (Addendum)
Procedure Name: Intubation Date/Time: 12/11/2012 8:00 AM Performed by: Carmela Rima Pre-anesthesia Checklist: Patient identified, Emergency Drugs available, Suction available and Patient being monitored Patient Re-evaluated:Patient Re-evaluated prior to inductionOxygen Delivery Method: Circle system utilized Preoxygenation: Pre-oxygenation with 100% oxygen Intubation Type: IV induction Ventilation: Two handed mask ventilation required Laryngoscope Size: Miller, 2 and 3 Grade View: Grade I Tube type: Oral Tube size: 7.5 mm Number of attempts: 2 Airway Equipment and Method: Stylet Placement Confirmation: ETT inserted through vocal cords under direct vision,  positive ETCO2,  CO2 detector and breath sounds checked- equal and bilateral Secured at: 22 cm Dental Injury: Injury to lip

## 2012-12-11 NOTE — Preoperative (Signed)
Beta Blockers   Reason not to administer Beta Blockers:Not Applicable 

## 2012-12-11 NOTE — Discharge Summary (Signed)
[facility] Discharge Summary   Patient ID:  Mitchell [name] MRN: [id number] DOB/AGE: September 09, 1945 67 y.o.  Admit date: 12/11/2012 Discharge date: 12/12/12 Date of Surgery: 12/11/2012 Surgeon: Surgeon(s): Nada Libman, MD  Admission Diagnosis: AAA  Discharge Diagnoses:  AAA  Secondary Diagnoses: Past Medical History  Diagnosis Date  . CAD (coronary artery disease)     patient had non-ST-elevation MI in February 2008 and there was an 80-90% proximal LAD stenosis, 90% proximal first diagonal stenosis, 99% ostial ramus stenosis. The first obtuse marginal was subtotally occluded and there was an 80% mid RCA stenosis. EF was estimated 25% on ventriculogram, coronary artery bypass grafting was done with a LIMA to the LAD, sequential saphenous vein graft to the PDA,  . Aortic insufficiency   . AAA (abdominal aortic aneurysm)     ultrasound done in 3/11 and showed AAA 3.9 x 3.9cm  . HTN (hypertension)   . DM2 (diabetes mellitus, type 2)   . Carotid stenosis   . Torn rotator cuff     hx of. left   . History of PFTs     obstructive/restrictive. There is some reversibility with bronchodilator. The patient actually never smoked. He is on Spiriva  . Cancer     melanoma  . Altered taste   . Pain     back  . COPD (chronic obstructive pulmonary disease)   . Melanoma 09/2010    pT2a N0 M0; clark level IV.   Marland Kitchen Range of motion deficit 09/19/2011  . Myocardial infarction   . Diabetes mellitus   . Irregular heart beat   . Anxiety   . Depression   . Shortness of breath     with exeration    Procedure(s): ABDOMINAL AORTIC ENDOVASCULAR STENT GRAFT GORE  Discharged Condition: good  HPI: Mitchell [name] is a 67 y.o. male seen for followup of his abdominal aortic aneurysm. He reports occasional periumbilical abdominal pain. Otherwise there has been no changes in his symptoms. He is low concern about some discoloration on his left leg.  The patient continues to be  medically managed for his hypercholesterolemia with a statin. He is on oral medication for diabetes. He is medically managed for his hypertension. He is on dual agent antiplatelet therapy. There has been a significant increase in the size of his aneurysm. Dr. Myra Gianotti told him that he feels it is time to have this repaired to prevent rupture. We discussed that there are multiple potential side effects from the operation but that there risk of recurrence is relatively low. When compared to his risk for rupture, endovascular repair is recommended. He wants to get this done as soon as possible now. He has been scheduled for Friday, June 27. He will need to be off of his Plavix 5 days prior to the operation.   Hospital Course:  Mitchell [name] is a 67 y.o. male is S/P Procedure(s): ABDOMINAL AORTIC ENDOVASCULAR STENT GRAFT GORE for re[air of enlarging AAA Bilateral ultrasound-guided percutaneous common femoral artery access  #2: Endovascular repair of abdominal aortic aneurysm  #3: Distal extension x1  #4: Abdominal aortogram  #5: Catheter in aorta x2 Devices used: Primary right Gore Excluder 23 x 14 x 16. Contralateral left Gore Excluder 14 x 12. Left distal extension Gore Excluder 14 x 10  Extubated: POD # 0 Physical exam: Abd soft , NT NABS bilat groin wounds soft without hematoma + inguinal hernia easily reduced on left Post-op wounds healing well Pt. Ambulating, voiding and  taking PO diet without difficulty. Pt pain controlled with PO pain meds. Labs as below Complications:none  Consults:     Significant Diagnostic Studies: CBC Lab Results  Component Value Date   WBC 7.3 12/09/2012   HGB 15.2 12/09/2012   HCT 41.9 12/09/2012   MCV 90.7 12/09/2012   PLT 174 12/09/2012    BMET    Component Value Date/Time   NA 138 12/09/2012 1255   NA 138 09/18/2012 1428   K 3.6 12/09/2012 1255   K 3.7 09/18/2012 1428   CL 101 12/09/2012 1255   CL 101 09/18/2012 1428   CO2 24 12/09/2012 1255   CO2 28  09/18/2012 1428   GLUCOSE 154* 12/09/2012 1255   GLUCOSE 105* 09/18/2012 1428   BUN 15 12/09/2012 1255   BUN 18.2 09/18/2012 1428   CREATININE 0.94 12/09/2012 1255   CREATININE 1.21 11/24/2012 1325   CREATININE 1.2 09/18/2012 1428   CALCIUM 9.1 12/09/2012 1255   CALCIUM 9.0 09/18/2012 1428   GFRNONAA 85* 12/09/2012 1255   GFRAA >90 12/09/2012 1255   COAG Lab Results  Component Value Date   INR 1.05 12/09/2012   INR 1.36 10/29/2010   INR 1.08 10/19/2010     Disposition:  Discharge to :Home Discharge Orders   Future Appointments Provider Department Dept Phone   09/17/2013 2:45 PM Krista Blue [facility] [phone]   09/17/2013 3:15 PM Myrtis Ser, NP [facility] [phone]   Future Orders Complete By Expires     ABDOMINAL PROCEDURE/ANEURYSM REPAIR/AORTO-BIFEMORAL BYPASS:  Call MD for increased abdominal pain; cramping diarrhea; nausea/vomiting  As directed     Call MD for:  redness, tenderness, or signs of infection (pain, swelling, bleeding, redness, odor or green/yellow discharge around incision site)  As directed     Call MD for:  severe or increased pain, loss or decreased feeling  in affected limb(s)  As directed     Call MD for:  temperature >100.5  As directed     Driving Restrictions  As directed     Comments:      No driving for 2 weeks    Increase activity slowly  As directed     Comments:      Walk with assistance use walker or cane as needed    Lifting restrictions  As directed     Comments:      No lifting for 6 weeks    May shower   As directed     Scheduling Instructions:      Sunday    Remove dressing in 24 hours  As directed     Resume previous diet  As directed         Medication List    TAKE these medications       acetaminophen 325 MG tablet  Commonly known as:  TYLENOL  Take 650 mg by mouth every 6 (six) hours as needed for pain.     aliskiren 150 MG tablet  Commonly known as:  TEKTURNA   Take 150 mg by mouth daily.     amitriptyline 10 MG tablet  Commonly known as:  ELAVIL  Take 20 mg by mouth at bedtime.     amLODipine 10 MG tablet  Commonly known as:  NORVASC  Take 10 mg by mouth daily.     aspirin 81 MG chewable tablet  Chew 81 mg by mouth daily.     atorvastatin 40 MG tablet  Commonly known as:  LIPITOR  Take 40 mg by mouth daily.     benazepril 40 MG tablet  Commonly known as:  LOTENSIN  Take 40 mg by mouth daily.     chlorthalidone 25 MG tablet  Commonly known as:  HYGROTON  Take 1 tablet (25 mg total) by mouth daily.     clopidogrel 75 MG tablet  Commonly known as:  PLAVIX  Take 75 mg by mouth daily.     Coenzyme Q10 200 MG capsule  Take 200 mg by mouth daily.     COLACE PO  Take by mouth 2 (two) times daily.     COREG 25 MG tablet  Generic drug:  carvedilol  Take 1 tablet (25 mg total) by mouth 2 (two) times daily.     FISH OIL TRIPLE STRENGTH 1400 MG Caps  Take 1,400 mg by mouth every morning.     glipiZIDE 10 MG tablet  Commonly known as:  GLUCOTROL  Take 10 mg by mouth daily.     multivitamin tablet  Take 1 tablet by mouth daily.     oxyCODONE-acetaminophen 5-325 MG per tablet  Commonly known as:  ROXICET  Take 1 tablet by mouth every 4 (four) hours as needed for pain.     potassium chloride SA 20 MEQ tablet  Commonly known as:  K-DUR,KLOR-CON  Take 1 tablet (20 mEq total) by mouth 2 (two) times daily.     salsalate 500 MG tablet  Commonly known as:  DISALCID  Take 500 mg by mouth 3 (three) times daily as needed (for pain).     sildenafil 50 MG tablet  Commonly known as:  VIAGRA  Take 50 mg by mouth daily as needed for erectile dysfunction.     tiotropium 18 MCG inhalation capsule  Commonly known as:  SPIRIVA  Place 18 mcg into inhaler and inhale daily.     traZODone 100 MG tablet  Commonly known as:  DESYREL  Take 100 mg by mouth at bedtime.       Verbal and written Discharge instructions given to the patient.  Wound care per Discharge AVS     Follow-up Information   Follow up with Myra Gianotti IV, Lala Lund, MD In 4 weeks. (office will arrange -sent)    Contact information:   [address] [place] [address] [phone]       Signed: Marlowe Shores 12/11/2012, 9:37 AM  - For VQI Registry use --- Instructions: Press F2 to tab through selections.  Delete question if not applicable.   Post-op:  Time to Extubation: [x ] In OR, [ ]  < 12 hrs, [ ]  12-24 hrs, [ ]  >=24 hrs Vasopressors Req. Post-op: No MI: [x ] No, [ ]  Troponin only, [ ]  EKG or Clinical New Arrhythmia: No CHF: No ICU Stay: 1 days Transfusion: No    Complications: Resp failure: [x ] none, [ ]  Pneumonia, [ ]  Ventilator Chg in renal function: [x ] none, [ ]  Inc. Cr > 0.5, [ ]  Temp. Dialysis, [ ]  Permanent dialysis Leg ischemia: [x ] No, [ ]  Yes, no Surgery needed, [ ]  Yes, Surgery needed, [ ]  Amputation Bowel ischemia: [x ] No, [ ]  Medical Rx, [ ]  Surgical Rx Wound complication: [x ] No, [ ]  Superficial separation/infection, [ ]  Return to OR Return to OR: No  Return to OR for bleeding: No Stroke: [x ] None, [ ]  Minor, [ ]  Major  Discharge medications: Statin use:  Yes ASA use:  Yes Plavix use:  Yes Beta blocker use:  Yes

## 2012-12-11 NOTE — Anesthesia Postprocedure Evaluation (Signed)
Anesthesia Post Note  Patient: Mitchell [name]  Procedure(s) Performed: Procedure(s) (LRB): ABDOMINAL AORTIC ENDOVASCULAR STENT GRAFT GORE (N/A)  Anesthesia type: General  Patient location: PACU  Post pain: Pain level controlled  Post assessment: Patient's Cardiovascular Status Stable  Last Vitals:  Filed Vitals:   12/11/12 0936  BP:   Pulse:   Temp: 36.4 C  Resp:     Post vital signs: Reviewed and stable  Level of consciousness: alert  Complications: No apparent anesthesia complications

## 2012-12-11 NOTE — Op Note (Signed)
[facility] [place]  Patient name: Mitchell [name] MRN: [id number] DOB: 1946-02-22 Sex: male  12/11/2012 Pre-operative Diagnosis: Abdominal aortic aneurysm Post-operative diagnosis:  Same Surgeon:  Jorge Ny Assistants:  Della Goo Procedure:   #1: Bilateral ultrasound-guided percutaneous common femoral artery access   #2: Endovascular repair of abdominal aortic aneurysm   #3: Distal extension x1    #4: Abdominal aortogram   #5: Catheter in aorta x2 Anesthesia:  Gen. Blood Loss:  See anesthesia record Specimens:  None  Findings:  Complete exclusion Devices used: Primary right Gore Excluder 23 x 14 x 16. Contralateral left Gore Excluder 14 x 12. Left distal extension Gore Excluder 14 x 10  Indications:  The patient has been followed for a enlarging abdominal aortic aneurysm. He now meets the size criteria for repair. Risks and benefits were discussed with the patient and his preoperative clinic visit.  Procedure:  The patient was identified in the holding area and taken to [facility] OR ROOM 16  The patient was then placed supine on the table. general anesthesia was administered.  The patient was prepped and draped in the usual sterile fashion.  A time out was called and antibiotics were administered.  Ultrasound was used to evaluate bilateral common femoral arteries. They were widely patent without significant calcification. A #11 blade was used to make a small skin incision. Bilateral common femoral arteries were cannulated using an 18-gauge needle under ultrasound guidance. A Benson wire was advanced without resistance bilaterally. The subcutaneous tract was dilated with an 8 [place] dilator. Pro-glide devices were placed at the 11:00 and 1:00 position for pre-closure. 8 French sheaths were placed bilaterally. The patient was fully heparinized. Over an Amplatz superstiff wire, a 18 French dry seal sheath was placed up the right side. A Omni flush catheter was  advanced up the left side. An abdominal aortogram was performed, identifying the location of the renal arteries. The main body was prepared on the back table. This was a Biomedical scientist 23 x 14 x 16 device. It was advanced through the 18 French sheath on the right side. It was deployed at the level of the renal arteries. A second contrast injection was performed confirming that the device was in good position. Next, the contralateral gate was cannulated with a Benson wire and a Kumpe catheter. The Omni flush catheter was then able to be freely rotated within the main body of the device, confirming successful cannulation. An Amplatz superstiff wire was placed. The image detected was then placed in a right anterior oblique position and a retrograde injection through the left femoral sheath was performed which identified the origin of the left hypogastric artery. A 12 French dry seal sheath was then advanced over the Amplatz superstiff wire into the contralateral gate. A Gore Excluder 14 x 12 limb was selected. This was positioned and then deployed. Landed significantly shorter than anticipated and therefore a distal extension was required. This was a 14 x 10 Gore Excluder device. Next, a retrograde injection was performed through the sheath in the right groin with the image detected and a left anterior oblique position. This revealed that the ipsilateral limb was in good position, proximal to the right hypogastric artery. The ipsilateral limb was then deployed and the delivery system was removed. A Q-50 balloon was used to mold the proximal and distal attachment sites as well as the device overlap. A completion arteriogram was performed which revealed continued patency of bilateral renal arteries as well  as bilateral hypogastric arteries. There is no evidence of endoleak. Bentson wires were placed bilaterally and the sheaths were removed. The pro-glide devices were used to close both groins without complication. 50 mg  of protamine were administered. After the protamine had circulated, the groin incisions were closed with 4-0 Vicryl. Dermabond was applied. The patient was noted upon awakening from anesthesia to have a reducible left inguinal hernia. He had palpable posterior tibial pulses bilaterally. There were no complications.    Disposition:  To PACU in stable condition.   Juleen China, M.D. [facility] [place] Office: [phone] Pager:  [phone]

## 2012-12-11 NOTE — Progress Notes (Signed)
Patient was received to room 3310 earlier in the shift. Alert and oriented, vitals stable, family at  Bedside.

## 2012-12-11 NOTE — Anesthesia Preprocedure Evaluation (Addendum)
Anesthesia Evaluation  Patient identified by MRN, date of birth, ID band Patient awake    Reviewed: Allergy & Precautions, H&P , NPO status , Patient's Chart, lab work & pertinent test results, reviewed documented beta blocker date and time   Airway Mallampati: II TM Distance: >3 FB Neck ROM: full    Dental  (+) Teeth Intact and Dental Advidsory Given   Pulmonary shortness of breath and with exertion, COPD COPD inhaler,  breath sounds clear to auscultation        Cardiovascular hypertension, On Home Beta Blockers + CAD, + Past MI, + CABG and + Peripheral Vascular Disease Rhythm:regular     Neuro/Psych PSYCHIATRIC DISORDERS  Neuromuscular disease    GI/Hepatic negative GI ROS, Neg liver ROS,   Endo/Other  diabetes, Well Controlled, Type 1, Oral Hypoglycemic Agents  Renal/GU negative Renal ROS  negative genitourinary   Musculoskeletal   Abdominal   Peds  Hematology negative hematology ROS (+)   Anesthesia Other Findings See surgeon's H&P   Reproductive/Obstetrics negative OB ROS                          Anesthesia Physical Anesthesia Plan  ASA: III  Anesthesia Plan: General   Post-op Pain Management:    Induction: Intravenous  Airway Management Planned: Oral ETT  Additional Equipment: Arterial line and CVP  Intra-op Plan:   Post-operative Plan: Extubation in OR  Informed Consent: I have reviewed the patients History and Physical, chart, labs and discussed the procedure including the risks, benefits and alternatives for the proposed anesthesia with the patient or authorized representative who has indicated his/her understanding and acceptance.   Dental Advisory Given  Plan Discussed with: Anesthesiologist, CRNA and Surgeon  Anesthesia Plan Comments:        Anesthesia Quick Evaluation

## 2012-12-11 NOTE — Interval H&P Note (Signed)
History and Physical Interval Note:  12/11/2012 7:21 AM  Mitchell [name]  has presented today for surgery, with the diagnosis of AAA  The various methods of treatment have been discussed with the patient and family. After consideration of risks, benefits and other options for treatment, the patient has consented to  Procedure(s): ABDOMINAL AORTIC ENDOVASCULAR STENT GRAFT GORE (N/A) as a surgical intervention .  The patient's history has been reviewed, patient examined, no change in status, stable for surgery.  I have reviewed the patient's chart and labs.  Questions were answered to the patient's satisfaction.     BRABHAM IV, V. WELLS

## 2012-12-11 NOTE — Transfer of Care (Signed)
Immediate Anesthesia Transfer of Care Note  Patient: Mitchell [name]  Procedure(s) Performed: Procedure(s): ABDOMINAL AORTIC ENDOVASCULAR STENT GRAFT GORE (N/A)  Patient Location: PACU  Anesthesia Type:General  Level of Consciousness: awake, alert  and oriented  Airway & Oxygen Therapy: Patient Spontanous Breathing and Patient connected to nasal cannula oxygen  Post-op Assessment: Report given to PACU RN, Post -op Vital signs reviewed and stable and Patient able to stick tongue midline  Post vital signs: Reviewed and stable  Complications: No apparent anesthesia complications

## 2012-12-11 NOTE — Progress Notes (Signed)
Utilization review completed.  

## 2012-12-12 LAB — CBC
MCH: 32.7 pg (ref 26.0–34.0)
MCV: 92.1 fL (ref 78.0–100.0)
Platelets: 141 10*3/uL — ABNORMAL LOW (ref 150–400)
RBC: 3.94 MIL/uL — ABNORMAL LOW (ref 4.22–5.81)
RDW: 13 % (ref 11.5–15.5)

## 2012-12-12 LAB — BASIC METABOLIC PANEL
CO2: 26 mEq/L (ref 19–32)
Calcium: 7.9 mg/dL — ABNORMAL LOW (ref 8.4–10.5)
Creatinine, Ser: 1.01 mg/dL (ref 0.50–1.35)
GFR calc Af Amer: 87 mL/min — ABNORMAL LOW (ref >90)
GFR calc non Af Amer: 75 mL/min — ABNORMAL LOW (ref >90)
Sodium: 139 mEq/L (ref 135–145)

## 2012-12-12 LAB — HEMOGLOBIN A1C
Hgb A1c MFr Bld: 6.7 % — ABNORMAL HIGH (ref <5.7)
Mean Plasma Glucose: 146 mg/dL — ABNORMAL HIGH (ref <117)

## 2012-12-12 LAB — GLUCOSE, CAPILLARY: Glucose-Capillary: 182 mg/dL — ABNORMAL HIGH (ref 70–99)

## 2012-12-12 NOTE — Progress Notes (Signed)
[facility] Progress Note  12/12/2012 8:31 AM 1 Day Post-Op  Subjective:  No complaints  Tm 101/1 now afebrile VSS 94% RA  Filed Vitals:   12/12/12 0700  BP: 124/61  Pulse: 65  Temp:   Resp: 17    Physical Exam: Cardiac:  RRR Lungs:  Non labored Incisions:  Bilateral groins are soft Extremities:  Bilateral palpable PT   CBC    Component Value Date/Time   WBC 7.9 12/12/2012 0528   WBC 7.6 09/18/2012 1428   RBC 3.94* 12/12/2012 0528   RBC 4.62 09/18/2012 1428   HGB 12.9* 12/12/2012 0528   HGB 15.1 09/18/2012 1428   HCT 36.3* 12/12/2012 0528   HCT 43.5 09/18/2012 1428   PLT 141* 12/12/2012 0528   PLT 158 09/18/2012 1428   MCV 92.1 12/12/2012 0528   MCV 94.2 09/18/2012 1428   MCH 32.7 12/12/2012 0528   MCH 32.6 09/18/2012 1428   MCHC 35.5 12/12/2012 0528   MCHC 34.7 09/18/2012 1428   RDW 13.0 12/12/2012 0528   RDW 13.3 09/18/2012 1428   LYMPHSABS 2.9 09/18/2012 1428   LYMPHSABS 2.2 04/16/2011 1211   MONOABS 0.8 09/18/2012 1428   MONOABS 0.7 04/16/2011 1211   EOSABS 0.2 09/18/2012 1428   EOSABS 0.2 04/16/2011 1211   BASOSABS 0.0 09/18/2012 1428   BASOSABS 0.0 04/16/2011 1211    BMET    Component Value Date/Time   NA 139 12/12/2012 0528   NA 138 09/18/2012 1428   K 3.0* 12/12/2012 0528   K 3.7 09/18/2012 1428   CL 104 12/12/2012 0528   CL 101 09/18/2012 1428   CO2 26 12/12/2012 0528   CO2 28 09/18/2012 1428   GLUCOSE 123* 12/12/2012 0528   GLUCOSE 105* 09/18/2012 1428   BUN 11 12/12/2012 0528   BUN 18.2 09/18/2012 1428   CREATININE 1.01 12/12/2012 0528   CREATININE 1.21 11/24/2012 1325   CREATININE 1.2 09/18/2012 1428   CALCIUM 7.9* 12/12/2012 0528   CALCIUM 9.0 09/18/2012 1428   GFRNONAA 75* 12/12/2012 0528   GFRAA 87* 12/12/2012 0528    INR    Component Value Date/Time   INR 1.06 12/11/2012 1043     Intake/Output Summary (Last 24 hours) at 12/12/12 0831 Last data filed at 12/12/12 4098  Gross per 24 hour  Intake 4138.33 ml  Output   3625 ml  Net 513.33 ml      Assessment/Plan:  67 y.o. male is s/p:  #1: Bilateral ultrasound-guided percutaneous common femoral artery access  #2: Endovascular repair of abdominal aortic aneurysm  #3: Distal extension x1  #4: Abdominal aortogram  #5: Catheter in aorta x2   1 Day Post-Op  -pt doing well this am -he has ambulated, but has not voided. -will have him eat breakfast and will need to void before discharge. -DVT prophylaxis:  Will discharge later today -f/u with Dr. Myra Gianotti in 4 weeks with CTA.   Doreatha Massed, PA-C [facility] [phone] 12/12/2012 8:31 AM

## 2012-12-13 NOTE — Progress Notes (Signed)
Agree with above  Mitchell [name] 

## 2012-12-13 NOTE — Progress Notes (Signed)
Pt d/c home per MD order, pt VSS, pt verbalized understanding of d/c and  Instructions, family as BS, pt walked around room to [facility] tol well

## 2012-12-14 ENCOUNTER — Other Ambulatory Visit: Payer: Self-pay

## 2012-12-14 ENCOUNTER — Encounter (HOSPITAL_COMMUNITY): Payer: Self-pay | Admitting: Surgery

## 2012-12-14 DIAGNOSIS — Z48812 Encounter for surgical aftercare following surgery on the circulatory system: Secondary | ICD-10-CM

## 2012-12-14 DIAGNOSIS — I714 Abdominal aortic aneurysm, without rupture: Secondary | ICD-10-CM

## 2012-12-14 NOTE — Discharge Summary (Signed)
I agree with the above. The patient did very well, status post endovascular aneurysm repair. I will see him back in one month.  Durene Cal

## 2012-12-16 ENCOUNTER — Telehealth: Payer: Self-pay | Admitting: Surgery

## 2012-12-16 ENCOUNTER — Telehealth: Payer: Self-pay | Admitting: Cardiology

## 2012-12-16 NOTE — Telephone Encounter (Signed)
Pt.notified

## 2012-12-16 NOTE — Telephone Encounter (Signed)
Message copied by Margaretmary Eddy on Wed Dec 16, 2012  9:30 AM ------      Message from: Kinnie Scales A      Created: Mon Dec 14, 2012  9:53 AM                   ----- Message -----         From: Melene Plan, RN         Sent: 12/11/2012   9:50 AM           To: Sharee Pimple, CMA, [facility]                        ----- Message -----         From: Nada Libman, MD         Sent: 12/11/2012   9:23 AM           To: Reuel Derby, Melene Plan, RN            12/11/2012:                  Surgeon:  Jorge Ny      Assistants:  Della Goo      Procedure:   #1: Bilateral ultrasound-guided percutaneous common femoral artery access        #2: Endovascular repair of abdominal aortic aneurysm        #3: Distal extension x1         #4: Abdominal aortogram        #5: Catheter in aorta x2                  Please schedule the patient to followup in one month with a CT angiogram of the abdomen and pelvis ------

## 2012-12-16 NOTE — Telephone Encounter (Signed)
New Prob    Pt states he just had an anurism repaired and was told to stop taking one of his medications, TEKTURNA. Pt wants to get an OK from Dr. Shirlee Latch before stopping. Please call.

## 2012-12-16 NOTE — Telephone Encounter (Signed)
Pt states he was told by nurse when he was discharged from the hospital recently following aneurysm repair that he should stop Tekturna. He states the pharmacy recommended he discontinue this because it may not be recommended for diabetic patients, may not be recommended with ACE inhibitor, and it may increase risk of renal failure. Pt has continued to take Tekturna. He is asking  Dr Alford Highland recommendation for stopping Tekturna.

## 2012-12-16 NOTE — Telephone Encounter (Signed)
lvm for pt, sent letter - kf °

## 2012-12-16 NOTE — Telephone Encounter (Signed)
He should stop Tekturna at this point.  Check on BP daily and call us in 2 wks with his readings.

## 2012-12-25 ENCOUNTER — Telehealth: Payer: Self-pay | Admitting: *Deleted

## 2012-12-25 NOTE — Telephone Encounter (Signed)
Patient called in to report LLE numbness and slight pain from groin to foot last night; only in left leg (S/P EVAR on 12-11-12 for AAA).  He reports being afebrile, has no abdominal pain or distention, and is having no bowel or bladder difficulties. His incisions are healing with no drainage, redness or pain; he does have a left inguinal hernia but this is currently not painful. Mitchell [name] says that he has no skin discoloration, redness, firm areas or swelling in his leg.  He is a diabetic and he has previously been told, by his PCP, that he has neuropathy in his legs; He takes amitriptyline and trazodone. His blood sugars this morning were 119, which he says is a lot lower than usual. I instructed him to contact his PCP for guidance on making changes in his amitriptyline (Patient has been taking 2 instead of the prescribed amount). He will call back if there is any other changes. He voiced understanding of this plan and is in agreement.

## 2012-12-30 ENCOUNTER — Telehealth: Payer: Self-pay | Admitting: Cardiology

## 2012-12-30 NOTE — Telephone Encounter (Signed)
Spoke with patient. Tekturna discontinued 12/16/12 due to potential interaction with other medications. Pt calling with BP readings since stopping Tekturna. I will forward to Dr Shirlee Latch for review.

## 2012-12-30 NOTE — Telephone Encounter (Signed)
New problem   B/p: between 130-145 in the morning-bottom 75-night b/p 100-115 over 70-75

## 2012-12-30 NOTE — Telephone Encounter (Signed)
OK not changes

## 2013-01-01 NOTE — Telephone Encounter (Signed)
Pt.notified

## 2013-01-08 ENCOUNTER — Encounter: Payer: Self-pay | Admitting: Surgery

## 2013-01-11 ENCOUNTER — Encounter: Payer: Self-pay | Admitting: Surgery

## 2013-01-11 ENCOUNTER — Ambulatory Visit (INDEPENDENT_AMBULATORY_CARE_PROVIDER_SITE_OTHER): Payer: Medicare Other | Admitting: Surgery

## 2013-01-11 ENCOUNTER — Ambulatory Visit
Admission: RE | Admit: 2013-01-11 | Discharge: 2013-01-11 | Disposition: A | Discharge status: Home / Self Care | Payer: Medicare Other | Source: Ambulatory Visit | Attending: Surgery | Admitting: Surgery

## 2013-01-11 VITALS — BP 138/79 | HR 62 | Resp 18 | Ht 72.0 in | Wt 205.0 lb

## 2013-01-11 DIAGNOSIS — Z48812 Encounter for surgical aftercare following surgery on the circulatory system: Secondary | ICD-10-CM

## 2013-01-11 DIAGNOSIS — I714 Abdominal aortic aneurysm, without rupture: Secondary | ICD-10-CM

## 2013-01-11 MED ORDER — IOHEXOL 300 MG/ML  SOLN
80.0000 mL | Freq: Once | INTRAMUSCULAR | Status: AC | PRN
  Administered 2013-01-11: 80 mL via INTRAVENOUS

## 2013-01-11 NOTE — Progress Notes (Signed)
[facility] [place]  Post-op EVAR Date of Surgery: 12/11/2012 Device: Endoleak: No:   History of Present Illness  Mitchell [name] is a 67 y.o. male who is s/p EVAR. The patient denies back pain; denies abdominal pain; denies lower extremity pain.  He is Ambulating and taking PO well without nausea or vomiting. He reports being dizzy when standing up fast that has increased since surgery.  IMAGING: Ct Angio Abd/pel W/ And/or W/o  01/11/2013   *RADIOLOGY REPORT*  Clinical Data:  67 year old male with status post endovascular aortic repair of AAA on 12/11/2012.  This is the first postoperative follow-up study.  CT ANGIOGRAPHY ABDOMEN AND PELVIS  Technique:  Multidetector CT imaging of the abdomen and pelvis was performed using the standard protocol during bolus administration of intravenous contrast.  Multiplanar reconstructed images including MIPs were obtained and reviewed to evaluate the vascular anatomy.  Contrast: 80mL OMNIPAQUE IOHEXOL 300 MG/ML  SOLN  Comparison:  Preoperative CTA 11/30/2012  Findings:  VASCULAR  Aorta: Eccentric combined fusiform and saccular infrarenal abdominal aortic aneurysm status post endovascular repair with a Gore Excluder endoprosthesis.  The endoprosthesis extends from just below the level of the renal arteries into the bilateral common iliac arteries.  The excluded aneurysm sac is unchanged at 5.2 x 3.9 cm in greatest dimension.  On the contrast-enhanced images, there is a very small amount of contrast material within the excluded aneurysm sac posteriorly at the 7-8 o'clock position (image 108 of series 5) and anteriorly at the 2 o'clock position (image 100 of series 5).  The right posterolateral contrast appears to be arising from the L5 lumbar arteries and anterior contrast is in close approximation with the origin of the inferior mesenteric artery. There is mild (approximately 10 HU) enhancement of the aneurysm sac on the delayed images. The  proximal and distal seal zones are well opposed.  No evidence of type 1 endoleak.  No evidence of graft migration.  The crossed limbs are widely patent without evidence of thrombosis.  Celiac: Widely patent.  Conventional hepatic arterial anatomy.  SMA: Atherosclerotic plaque at the origin with minimal narrowing. The distal branches are widely patent.  There is communication with the inferior mesenteric artery through an Arc of [place].  Renals: Atherosclerotic calcification at the origin of the bilateral single dominant renal arteries.  There may be mild narrowing on the left.  No significant accessory renal artery or evidence of fibromuscular dysplasia.  IMA: Patent.  Inflow: Stent grafts limbs extend through the common iliac arteries and terminate at the bifurcations bilaterally.  Bilateral hypogastric arteries are patent.  The external iliac arteries are relatively free of atherosclerotic disease.  Proximal Outflow: Mild atherosclerotic plaque posteriorly in the common femoral arteries.  The proximal superficial and profunda femoral arteries are widely patent.  Veins: No focal venous abnormality identified.  NON-VASCULAR  Lower Chest: The lung bases are clear.  Trace pleural parenchymal scarring in the posterior left lower lobe.  Atherosclerotic calcifications noted throughout the coronary arteries. Additionally, there are incompletely imaged surgical changes of prior coronary artery bypass.  The heart itself is within normal limits for size.  Abdomen: Unremarkable CT appearance of the stomach, duodenum, spleen, adrenal glands, and pancreas.  No focal hepatic lesion. Gallbladder is unremarkable. No intra or extrahepatic biliary ductal dilatation.  Symmetric renal parenchymal enhancement bilaterally.  No hydronephrosis or solid renal mass.  Normal-caliber large and small bowel throughout the abdomen.  No evidence of obstruction or focal bowel wall thickening.  Normal appendix in the right lower quadrant.  No free  fluid or suspicious adenopathy.  Pelvis: Prostatomegaly.  The prostate measures 6.2 cm transversely. Small bilateral left slightly larger than right fat containing inguinal hernias.  No free fluid or suspicious adenopathy.  Bones: No acute fracture or aggressive appearing lytic or blastic osseous lesion.  Review of the MIP images confirms the above findings.  IMPRESSION:  Small type 2 endoleak which appears to be originating from a combination of the L5 lumbar arteries and the inferior mesenteric artery in this patient status post EVAR of an AAA.  The excluded aneurysm sac remains stable in size compared to prior with a maximal diameter of 5.2 cm. Recommend continued attention on follow- up imaging.  Additional ancillary findings as above without significant interval change.  These results were called by telephone on 01/11/2013 at 03:45 p.m. to Dr. Durene Cal, who verbally acknowledged these results.   Original Report Authenticated By: Malachy Moan, M.D.    Significant Diagnostic Studies: CBC Lab Results  Component Value Date   WBC 7.9 12/12/2012   HGB 12.9* 12/12/2012   HCT 36.3* 12/12/2012   MCV 92.1 12/12/2012   PLT 141* 12/12/2012     BMET    Component Value Date/Time   NA 139 12/12/2012 0528   NA 138 09/18/2012 1428   K 3.0* 12/12/2012 0528   K 3.7 09/18/2012 1428   CL 104 12/12/2012 0528   CL 101 09/18/2012 1428   CO2 26 12/12/2012 0528   CO2 28 09/18/2012 1428   GLUCOSE 123* 12/12/2012 0528   GLUCOSE 105* 09/18/2012 1428   BUN 11 12/12/2012 0528   BUN 18.2 09/18/2012 1428   CREATININE 1.01 12/12/2012 0528   CREATININE 1.21 11/24/2012 1325   CREATININE 1.2 09/18/2012 1428   CALCIUM 7.9* 12/12/2012 0528   CALCIUM 9.0 09/18/2012 1428   GFRNONAA 75* 12/12/2012 0528   GFRAA 87* 12/12/2012 0528    COAG Lab Results  Component Value Date   INR 1.06 12/11/2012   INR 1.05 12/09/2012   INR 1.36 10/29/2010   No results found for this basename: PTT     @[email or web address] @ No data found.   Physical  Examination  BP Readings from Last 3 Encounters:  01/11/13 138/79  12/12/12 146/78  12/12/12 146/78   Temp Readings from Last 3 Encounters:  12/12/12 98.5 F (36.9 C) Oral  12/12/12 98.5 F (36.9 C) Oral  12/09/12 97.7 F (36.5 C)    SpO2 Readings from Last 3 Encounters:  12/12/12 99%  12/12/12 99%  12/09/12 98%   Pulse Readings from Last 3 Encounters:  01/11/13 62  12/12/12 58  12/12/12 58    General: A&O x 3, WDWN male in NAD Gait: Normal Pulmonary: normal non-labored breathing  Cardiac: RRR Abdomen: soft, NT, NABS Bilateral groin wounds: clean, dry, intact, without hematoma Vascular Exam/Pulses:DP/PT bil equal  Extremities without ischemic changes, no Gangrene , no cellulitis; no open wounds;   Neurologic: A&O X 3; Appropriate Affect  Assessment: 1 month s/p EVAR small type 2 endo leak.   We will order CTA in 6 months to check the Stent and endo leak status.  SignedClinton Gallant [facility]  01/11/2013 5:51 PM.  I agree with the above. The patient is one month status post percutaneous endovascular aneurysm repair. He does complain of being fatigued. Otherwise he is doing very well. His CT scan today showed good position of the stent graft with a small type II endoleak. I recommended surveillance CT scan  in 6 months  Wells Brabham

## 2013-01-12 ENCOUNTER — Other Ambulatory Visit: Payer: Self-pay | Admitting: *Deleted

## 2013-01-12 ENCOUNTER — Other Ambulatory Visit: Payer: Self-pay | Admitting: Cardiology

## 2013-01-12 DIAGNOSIS — Z48812 Encounter for surgical aftercare following surgery on the circulatory system: Secondary | ICD-10-CM

## 2013-01-12 DIAGNOSIS — I714 Abdominal aortic aneurysm, without rupture: Secondary | ICD-10-CM

## 2013-01-20 ENCOUNTER — Other Ambulatory Visit: Payer: Self-pay

## 2013-02-22 ENCOUNTER — Other Ambulatory Visit: Payer: Self-pay | Admitting: Surgery

## 2013-02-22 DIAGNOSIS — I714 Abdominal aortic aneurysm, without rupture: Secondary | ICD-10-CM

## 2013-04-22 ENCOUNTER — Other Ambulatory Visit: Payer: Self-pay

## 2013-06-24 ENCOUNTER — Encounter: Payer: Self-pay | Admitting: *Deleted

## 2013-06-24 ENCOUNTER — Encounter: Payer: Self-pay | Admitting: Cardiology

## 2013-06-24 ENCOUNTER — Ambulatory Visit (INDEPENDENT_AMBULATORY_CARE_PROVIDER_SITE_OTHER): Payer: Medicare Other | Admitting: Cardiology

## 2013-06-24 VITALS — BP 133/81 | HR 73 | Ht 72.0 in | Wt 206.0 lb

## 2013-06-24 DIAGNOSIS — I5032 Chronic diastolic (congestive) heart failure: Secondary | ICD-10-CM

## 2013-06-24 DIAGNOSIS — I714 Abdominal aortic aneurysm, without rupture, unspecified: Secondary | ICD-10-CM

## 2013-06-24 DIAGNOSIS — I6529 Occlusion and stenosis of unspecified carotid artery: Secondary | ICD-10-CM

## 2013-06-24 DIAGNOSIS — I25810 Atherosclerosis of coronary artery bypass graft(s) without angina pectoris: Secondary | ICD-10-CM

## 2013-06-24 DIAGNOSIS — I658 Occlusion and stenosis of other precerebral arteries: Secondary | ICD-10-CM

## 2013-06-24 DIAGNOSIS — E785 Hyperlipidemia, unspecified: Secondary | ICD-10-CM

## 2013-06-24 DIAGNOSIS — I6523 Occlusion and stenosis of bilateral carotid arteries: Secondary | ICD-10-CM

## 2013-06-24 NOTE — Patient Instructions (Signed)
Your physician recommends that you return for a FASTING lipid profile /BMET March 2015  Your physician has requested that you have a carotid duplex. This test is an ultrasound of the carotid arteries in your neck. It looks at blood flow through these arteries that supply the brain with blood. Allow one hour for this exam. There are no restrictions or special instructions. MARCH 2015  Your physician wants you to follow-up in: 6 months with Dr Aundra Dubin. (July 2015).  You will receive a reminder letter in the mail two months in advance. If you don't receive a letter, please call our office to schedule the follow-up appointment.

## 2013-06-25 NOTE — Progress Notes (Signed)
Patient ID: Mitchell [name], male   DOB: 1945/12/28, 68 y.o.   MRN: [id number] PCP: Dr. Arelia Sneddon  68 yo with h/o CAD s/p CABG, aortic aneurysm s/p repair, HTN, DM2 returns to cardiology clinic.  In 6/14 he had EVAR for repair of AAA.  He denies chest pain.  He has stable exertional dyspnea.  He is mildly short of breath walking up stairs and is short of breath walking 100-200 feet in [employer].  He has been hoarse recently.  Dr. Arelia Sneddon is following this and may refer him to an ENT MD.    ECG: NSR, normal  Labs (8/10): K 3.8, creatinine 1.12, LDL 61, HDL 29 Labs (3/11): HDL 32, LDL 55, LFTs normal Labs (10/11): LDL 55, HDL 27, K 3.9, creatinine 1.0 Labs (4/12): LDL 53, HDL 27 Labs (10/12): LDL 49, HDL 39 Labs (4/13): LDL 42, HDL 31 Labs (10/13): K 3.7, creatinine 1.3 Labs (3/14): LDL 60, HDL 21 Labs (4/14): K 3.7, creatinine 1.2 Labs (6/14): K 3, creatinine 1 (K checked at Dr. Arelia Sneddon office and was normal recently)  Allergies (verified):  No Known Drug Allergies  Past Medical History: 1. Coronary artery disease.  The patient had non-ST-elevation MI in February 2008 and there was an 80-90% proximal LAD stenosis, 90%proximal first diagonal stenosis, 99% ostial ramus stenosis.  The first obtuse marginal was subtotally occluded and there was an 80% mid RCA stenosis.  EF was estimated 25% on ventriculogram, coronary artery bypass grafting was done with a LIMA to the LAD, sequential saphenous vein graft to the PDA, and PLOM, saphenous graft to the diagonal, saphenous vein graft to the ramus. 2. Aortic insufficiency: Echo (3/11): EF 55-60%, moderate diastolic dysfunction, moderate aortic insufficiency, moderate LAE.  Echo (3/12) with EF 50-55%, grade II diastolic dysfunction, mild MR, mild aortic insufficiency.  3. Infrarenal AAA. Abdominal US 3/11 with AAA 3.9 x 3.9 cm.  Abdominal US 3/12 with AAA 3.8 x 3.9 cm.  Abdominal US (3/13) with AAA 4.6 x 3.9 with complex plaque within the AAA.  CTA abdomen  showed 4 cm saccular infrarenal aneurysm.  CTA abdomen 5/14 showed saccular AAA grown to 5.3 cm. EVAR for repair in 6/14.  4. Hypertension: Renal artery dopplers in 5/13 with no significant renal artery stenosis.  5. Type 2 diabetes. 6. History of torn left rotator cuff. 7. History of obstructive/restrictive PFTs.  There is some reversibility with bronchodilator.  The patient actually never smoked.  He is on Spiriva. 8. Holter (3/10):  Frequent PACs and blocked PACs.  PVCs with trigeminy.  No long pauses.  Slowest HR in 40s while asleep.  9. Carotid stenosis.  Carotid dopplers (3/12) with stable 40-59% bilateral ICA stenosis.  Carotid dopplers (3/13) with 40-59% bilateral stenosis. Carotid dopplers (3/14) with 40-59% bilateral stenosis.  10.  Melanoma: s/p excision from back 5/12.   Family History: Both the patient's mother and father had abdominal aortic aneurysm and both died due to complications with attempted AAA repair.  Social History: The patient is a lifetime nonsmoker. He is unemployed but draws a pension from the [employer] [place].  He lives by himself.  He has no close family left, he only has cousins. He plays the piano in church.    Current Outpatient Prescriptions  Medication Sig Dispense Refill  . acetaminophen (TYLENOL) 325 MG tablet Take 650 mg by mouth every 6 (six) hours as needed for pain.       Marland Kitchen amLODipine (NORVASC) 10 MG tablet Take 10 mg by mouth daily.       Marland Kitchen  aspirin 81 MG chewable tablet Chew 81 mg by mouth daily.      Marland Kitchen atorvastatin (LIPITOR) 40 MG tablet Take 40 mg by mouth daily.      . benazepril (LOTENSIN) 40 MG tablet Take 40 mg by mouth daily.       . carvedilol (COREG) 25 MG tablet Take 1 tablet (25 mg total) by mouth 2 (two) times daily.      . chlorthalidone (HYGROTON) 25 MG tablet Take 1 tablet (25 mg total) by mouth daily.  90 tablet  3  . clopidogrel (PLAVIX) 75 MG tablet TAKE ONE TABLET BY MOUTH EVERY DAY  30 tablet  6  . Coenzyme Q10 200 MG capsule Take 200 mg by  mouth daily.      [name] Sodium (COLACE PO) Take by mouth 2 (two) times daily.      Marland Kitchen glipiZIDE (GLUCOTROL) 10 MG tablet Take 10 mg by mouth daily.       . metFORMIN (GLUMETZA) 500 MG (MOD) 24 hr tablet Take 500 mg by mouth daily with breakfast.      . Multiple Vitamin (MULTIVITAMIN) tablet Take 1 tablet by mouth daily.        . nortriptyline (PAMELOR) 50 MG capsule Take 50 mg by mouth at bedtime.      . Omega-3 Fatty Acids (FISH OIL TRIPLE STRENGTH) 1400 MG CAPS Take 1,400 mg by mouth every morning.      . potassium chloride SA (K-DUR,KLOR-CON) 20 MEQ tablet Take 1 tablet (20 mEq total) by mouth 2 (two) times daily.  180 tablet  3  . sildenafil (VIAGRA) 50 MG tablet Take 50 mg by mouth daily as needed for erectile dysfunction.      Marland Kitchen tiotropium (SPIRIVA) 18 MCG inhalation capsule Place 18 mcg into inhaler and inhale daily.        . traZODone (DESYREL) 100 MG tablet Take 100 mg by mouth at bedtime.        No current facility-administered medications for this visit.     BP 133/81  Pulse 73  Ht 6' (1.829 m)  Wt 93.441 kg (206 lb)  BMI 27.93 kg/m2 General:  Well developed, well nourished, in no acute distress. Neck:  Neck supple, no JVD. No masses, thyromegaly or abnormal cervical nodes. Lungs:  Slight crackles at bases.  Heart:  Non-displaced PMI, chest non-tender; regular rate and rhythm, S1, S2 without murmurs, rubs. +S4. Carotid upstroke normal, no bruit. Pedals normal pulses. 1+ bilateral ankle edema Abdomen:  Bowel sounds positive; abdomen soft and non-tender without masses, organomegaly, or hernias noted. No hepatosplenomegaly. Extremities:  No clubbing or cyanosis. Neurologic:  Alert and oriented x 3. Psych:  Normal affect.  Assessment/Plan:  CORONARY ARTERY DISEASE Stable with no ischemic symptoms. Continue ASA, Plavix, statin, Coreg, ACEI.  HYPERLIPIDEMIA Good LDL on atorvastatin. Repeat lipids in 3/15.  AAA  S/p repair by Dr. Trula Slade (EVAR).  CAROTID ARTERY STENOSIS   Stable moderate carotid stenosis. Repeat carotid dopplers in 3/15.  AORTIC INSUFFICIENCY  Only mild on last echo. May be improved due to better BP control.  HYPERTENSION BP better controlled.  No renal artery stenosis on dopplers in 5/13.  CHRONIC DIASTOLIC HEART FAILURE Stable dyspnea.  He does not appear particularly volume overloaded.    Loralie Champagne 06/25/2013

## 2013-07-13 ENCOUNTER — Other Ambulatory Visit: Payer: Self-pay | Admitting: Surgery

## 2013-07-13 LAB — CREATININE, SERUM: CREATININE: 1.03 mg/dL (ref 0.50–1.35)

## 2013-07-13 LAB — BUN: BUN: 16 mg/dL (ref 6–23)

## 2013-07-16 ENCOUNTER — Encounter: Payer: Self-pay | Admitting: Surgery

## 2013-07-19 ENCOUNTER — Encounter: Payer: Self-pay | Admitting: Surgery

## 2013-07-19 ENCOUNTER — Other Ambulatory Visit: Payer: Self-pay | Admitting: *Deleted

## 2013-07-19 ENCOUNTER — Ambulatory Visit (INDEPENDENT_AMBULATORY_CARE_PROVIDER_SITE_OTHER): Payer: Medicare Other | Admitting: Surgery

## 2013-07-19 ENCOUNTER — Ambulatory Visit
Admission: RE | Admit: 2013-07-19 | Discharge: 2013-07-19 | Disposition: A | Discharge status: Home / Self Care | Payer: Medicare Other | Source: Ambulatory Visit | Attending: Surgery | Admitting: Surgery

## 2013-07-19 VITALS — BP 140/81 | HR 65 | Resp 18 | Ht 72.0 in | Wt 199.8 lb

## 2013-07-19 DIAGNOSIS — I714 Abdominal aortic aneurysm, without rupture, unspecified: Secondary | ICD-10-CM

## 2013-07-19 MED ORDER — IOHEXOL 350 MG/ML SOLN
80.0000 mL | Freq: Once | INTRAVENOUS | Status: AC | PRN
  Administered 2013-07-19: 80 mL via INTRAVENOUS

## 2013-07-19 NOTE — Progress Notes (Signed)
Patient name: Mitchell [name] MRN: [id number] DOB: 11/10/1945 Sex: male     Chief Complaint  Patient presents with  . Follow-up    6 month FU with CTA abdomen/pelvis prior  . AAA    HISTORY OF PRESENT ILLNESS: The patient comes in today for followup.  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  His one-month CT angiogram showed a small endoleak.  He is back for followup.  He has no complaints.  His energy level has significantly improved.  Past Medical History  Diagnosis Date  . CAD (coronary artery disease)     patient had non-ST-elevation MI in February 2008 and there was an 80-90% proximal LAD stenosis, 90% proximal first diagonal stenosis, 99% ostial ramus stenosis. The first obtuse marginal was subtotally occluded and there was an 80% mid RCA stenosis. EF was estimated 25% on ventriculogram, coronary artery bypass grafting was done with a LIMA to the LAD, sequential saphenous vein graft to the PDA,  . Aortic insufficiency   . AAA (abdominal aortic aneurysm)     ultrasound done in 3/11 and showed AAA 3.9 x 3.9cm  . HTN (hypertension)   . DM2 (diabetes mellitus, type 2)   . Carotid stenosis   . Torn rotator cuff     hx of. left   . History of PFTs     obstructive/restrictive. There is some reversibility with bronchodilator. The patient actually never smoked. He is on Spiriva  . Cancer     melanoma  . Altered taste   . Pain     back  . COPD (chronic obstructive pulmonary disease)   . Melanoma 09/2010    pT2a N0 M0; clark level IV.   Marland Kitchen Range of motion deficit 09/19/2011  . Myocardial infarction   . Diabetes mellitus   . Irregular heart beat   . Anxiety   . Depression   . Shortness of breath     with exeration    Past Surgical History  Procedure Laterality Date  . Tonsillectomy  1951  . Arterial bypass surgry    . Melanoma excision    . Blood clots removed    . Application of wound vac    . Coronary artery bypass  graft  07/2006  . Tonsillectomy    . Abdominal aortic endovascular stent graft N/A 12/11/2012    Procedure: ABDOMINAL AORTIC ENDOVASCULAR STENT GRAFT GORE;  Surgeon: Serafina Mitchell, MD;  Location: [facility];  Service: Vascular;  Laterality: N/A;    History   Social History  . Marital Status: Divorced    Spouse Name: N/A    Number of Children: N/A  . Years of Education: N/A   Occupational History  . Not on file.   Social History Main Topics  . Smoking status: Never Smoker   . Smokeless tobacco: Never Used  . Alcohol Use: No  . Drug Use: No  . Sexual Activity: Not on file   Other Topics Concern  . Not on file   Social History Narrative  . No narrative on file    Family History  Problem Relation Age of Onset  . Hyperlipidemia Mother   . Hypertension Mother   . Hypertension Father   . Other Father     AAA  . Heart disease Father     Allergies as of 07/19/2013 - Review Complete 07/19/2013  Allergen Reaction Noted  . Zocor [simvastatin] Other (See Comments) 09/18/2012    Current Outpatient  Prescriptions on File Prior to Visit  Medication Sig Dispense Refill  . acetaminophen (TYLENOL) 325 MG tablet Take 650 mg by mouth every 6 (six) hours as needed for pain.       Marland Kitchen amLODipine (NORVASC) 10 MG tablet Take 10 mg by mouth daily.       Marland Kitchen aspirin 81 MG chewable tablet Chew 81 mg by mouth daily.      Marland Kitchen atorvastatin (LIPITOR) 40 MG tablet Take 40 mg by mouth daily.      . benazepril (LOTENSIN) 40 MG tablet Take 40 mg by mouth daily.       . carvedilol (COREG) 25 MG tablet Take 1 tablet (25 mg total) by mouth 2 (two) times daily.      . chlorthalidone (HYGROTON) 25 MG tablet Take 1 tablet (25 mg total) by mouth daily.  90 tablet  3  . clopidogrel (PLAVIX) 75 MG tablet TAKE ONE TABLET BY MOUTH EVERY DAY  30 tablet  6  . Coenzyme Q10 200 MG capsule Take 200 mg by mouth daily.      [name] Sodium (COLACE PO) Take by mouth 2 (two) times daily.      Marland Kitchen glipiZIDE (GLUCOTROL) 10 MG  tablet Take 10 mg by mouth daily.       . metFORMIN (GLUMETZA) 500 MG (MOD) 24 hr tablet Take 500 mg by mouth daily with breakfast.      . Multiple Vitamin (MULTIVITAMIN) tablet Take 1 tablet by mouth daily.        . nortriptyline (PAMELOR) 50 MG capsule Take 50 mg by mouth at bedtime.      . Omega-3 Fatty Acids (FISH OIL TRIPLE STRENGTH) 1400 MG CAPS Take 1,400 mg by mouth every morning.      . potassium chloride SA (K-DUR,KLOR-CON) 20 MEQ tablet Take 1 tablet (20 mEq total) by mouth 2 (two) times daily.  180 tablet  3  . sildenafil (VIAGRA) 50 MG tablet Take 50 mg by mouth daily as needed for erectile dysfunction.      Marland Kitchen tiotropium (SPIRIVA) 18 MCG inhalation capsule Place 18 mcg into inhaler and inhale daily.        . traZODone (DESYREL) 100 MG tablet Take 100 mg by mouth at bedtime.        No current facility-administered medications on file prior to visit.     REVIEW OF SYSTEMS: Cardiovascular: Positive for shortness of breath with exertion Pulmonary: No productive cough, asthma or wheezing. Neurologic: Positive for dizziness Hematologic: No bleeding problems or clotting disorders. Musculoskeletal: No joint pain or joint swelling. Gastrointestinal: No blood in stool or hematemesis Genitourinary: No dysuria or hematuria. Psychiatric:: No history of major depression. Integumentary: No rashes or ulcers. Constitutional: No fever or chills.  PHYSICAL EXAMINATION:   Vital signs are BP 140/81  Pulse 65  Resp 18  Ht 6' (1.829 m)  Wt 199 lb 12.8 oz (90.629 kg)  BMI 27.09 kg/m2 General: The patient appears their stated age. HEENT:  No gross abnormalities Pulmonary:  Non labored breathing Abdomen: Soft and non-tender.  Aorta is not palpable Musculoskeletal: There are no major deformities. Neurologic: No focal weakness or paresthesias are detected, Skin: There are no ulcer or rashes noted. Psychiatric: The patient has normal affect. Cardiovascular: There is a regular rate and rhythm  without significant murmur appreciated.   Diagnostic Studies I have reviewed his CT angiogram.  This shows that the stent graft is in good position.  There is a small type II endoleak  from the inferior mesenteric artery.  There is been no significant interval change in the size of his aneurysm.  It measures maximally 5.3 cm  Assessment: Status post and vas aneurysm repair Plan: I discussed the findings of the endoleak today with the patient.  Because there has not been any significant change in the size of the aneurysm, I have recommended continued surveillance.  I will repeat his CT angiogram in 6 months.  Eldridge Abrahams, M.D. [facility] [place] Office: [phone] Pager:  [phone]

## 2013-08-17 ENCOUNTER — Other Ambulatory Visit: Payer: Self-pay | Admitting: Oncology

## 2013-08-18 ENCOUNTER — Ambulatory Visit: Payer: Medicare Other | Admitting: Oncology

## 2013-08-22 ENCOUNTER — Other Ambulatory Visit: Payer: Self-pay | Admitting: Cardiology

## 2013-08-24 ENCOUNTER — Other Ambulatory Visit (INDEPENDENT_AMBULATORY_CARE_PROVIDER_SITE_OTHER): Payer: Medicare Other

## 2013-08-24 ENCOUNTER — Ambulatory Visit (HOSPITAL_COMMUNITY): Payer: Medicare Other | Attending: Cardiology | Admitting: Cardiology

## 2013-08-24 ENCOUNTER — Encounter (HOSPITAL_COMMUNITY): Payer: Medicare Other

## 2013-08-24 DIAGNOSIS — I6529 Occlusion and stenosis of unspecified carotid artery: Secondary | ICD-10-CM | POA: Insufficient documentation

## 2013-08-24 DIAGNOSIS — I6523 Occlusion and stenosis of bilateral carotid arteries: Secondary | ICD-10-CM

## 2013-08-24 DIAGNOSIS — I5032 Chronic diastolic (congestive) heart failure: Secondary | ICD-10-CM

## 2013-08-24 DIAGNOSIS — I25810 Atherosclerosis of coronary artery bypass graft(s) without angina pectoris: Secondary | ICD-10-CM

## 2013-08-24 DIAGNOSIS — E785 Hyperlipidemia, unspecified: Secondary | ICD-10-CM | POA: Insufficient documentation

## 2013-08-24 DIAGNOSIS — I658 Occlusion and stenosis of other precerebral arteries: Secondary | ICD-10-CM

## 2013-08-24 LAB — LIPID PANEL
CHOLESTEROL: 103 mg/dL (ref 0–200)
HDL: 28.9 mg/dL — ABNORMAL LOW (ref >39.00)
LDL CALC: 63 mg/dL (ref 0–99)
TRIGLYCERIDES: 58 mg/dL (ref 0.0–149.0)
Total CHOL/HDL Ratio: 4
VLDL: 11.6 mg/dL (ref 0.0–40.0)

## 2013-08-24 LAB — BASIC METABOLIC PANEL
BUN: 18 mg/dL (ref 6–23)
CHLORIDE: 104 meq/L (ref 96–112)
CO2: 28 mEq/L (ref 19–32)
CREATININE: 1.2 mg/dL (ref 0.4–1.5)
Calcium: 8.8 mg/dL (ref 8.4–10.5)
GFR: 62.12 mL/min (ref >60.00)
GLUCOSE: 171 mg/dL — AB (ref 70–99)
Potassium: 3.5 mEq/L (ref 3.5–5.1)
Sodium: 139 mEq/L (ref 135–145)

## 2013-08-24 NOTE — Progress Notes (Signed)
Carotid completed 

## 2013-08-27 ENCOUNTER — Telehealth: Payer: Self-pay | Admitting: Cardiology

## 2013-08-27 NOTE — Telephone Encounter (Signed)
Follow Up    Pt returning call from earlier. Please return call.

## 2013-09-16 ENCOUNTER — Other Ambulatory Visit: Payer: Self-pay | Admitting: Hematology and Oncology

## 2013-09-16 DIAGNOSIS — C4359 Malignant melanoma of other part of trunk: Secondary | ICD-10-CM

## 2013-09-17 ENCOUNTER — Ambulatory Visit: Payer: Medicare Other | Admitting: Oncology

## 2013-09-17 ENCOUNTER — Ambulatory Visit (HOSPITAL_BASED_OUTPATIENT_CLINIC_OR_DEPARTMENT_OTHER): Payer: Medicare Other | Admitting: Hematology and Oncology

## 2013-09-17 ENCOUNTER — Other Ambulatory Visit (HOSPITAL_BASED_OUTPATIENT_CLINIC_OR_DEPARTMENT_OTHER): Payer: Medicare Other

## 2013-09-17 ENCOUNTER — Encounter: Payer: Self-pay | Admitting: Hematology and Oncology

## 2013-09-17 ENCOUNTER — Other Ambulatory Visit: Payer: Medicare Other

## 2013-09-17 VITALS — BP 111/67 | HR 70 | Temp 98.7°F | Resp 20 | Ht 72.0 in | Wt 205.9 lb

## 2013-09-17 DIAGNOSIS — C4359 Malignant melanoma of other part of trunk: Secondary | ICD-10-CM

## 2013-09-17 DIAGNOSIS — C439 Malignant melanoma of skin, unspecified: Secondary | ICD-10-CM

## 2013-09-17 LAB — COMPREHENSIVE METABOLIC PANEL (CC13)
ALK PHOS: 57 U/L (ref 40–150)
ALT: 18 U/L (ref 0–55)
AST: 20 U/L (ref 5–34)
Albumin: 3.8 g/dL (ref 3.5–5.0)
Anion Gap: 11 mEq/L (ref 3–11)
BILIRUBIN TOTAL: 0.46 mg/dL (ref 0.20–1.20)
BUN: 23.1 mg/dL (ref 7.0–26.0)
CO2: 27 mEq/L (ref 22–29)
CREATININE: 1.3 mg/dL (ref 0.7–1.3)
Calcium: 9.3 mg/dL (ref 8.4–10.4)
Chloride: 105 mEq/L (ref 98–109)
Glucose: 94 mg/dl (ref 70–140)
Potassium: 3.7 mEq/L (ref 3.5–5.1)
SODIUM: 142 meq/L (ref 136–145)
TOTAL PROTEIN: 8.1 g/dL (ref 6.4–8.3)

## 2013-09-17 LAB — CBC WITH DIFFERENTIAL/PLATELET
BASO%: 0.6 % (ref 0.0–2.0)
Basophils Absolute: 0 10*3/uL (ref 0.0–0.1)
EOS%: 3 % (ref 0.0–7.0)
Eosinophils Absolute: 0.2 10*3/uL (ref 0.0–0.5)
HEMATOCRIT: 42.4 % (ref 38.4–49.9)
HGB: 14.4 g/dL (ref 13.0–17.1)
LYMPH%: 34.3 % (ref 14.0–49.0)
MCH: 32.7 pg (ref 27.2–33.4)
MCHC: 33.9 g/dL (ref 32.0–36.0)
MCV: 96.7 fL (ref 79.3–98.0)
MONO#: 0.9 10*3/uL (ref 0.1–0.9)
MONO%: 12.2 % (ref 0.0–14.0)
NEUT#: 3.6 10*3/uL (ref 1.5–6.5)
NEUT%: 49.9 % (ref 39.0–75.0)
PLATELETS: 175 10*3/uL (ref 140–400)
RBC: 4.39 10*6/uL (ref 4.20–5.82)
RDW: 13.1 % (ref 11.0–14.6)
WBC: 7.1 10*3/uL (ref 4.0–10.3)
lymph#: 2.4 10*3/uL (ref 0.9–3.3)

## 2013-09-17 LAB — LACTATE DEHYDROGENASE (CC13): LDH: 162 U/L (ref 125–245)

## 2013-09-17 NOTE — Progress Notes (Signed)
[place] progress notes  Patient Care Team: Leonard Downing, MD as PCP - General (Family Medicine) Bonnita Levan, MD (Dermatology) Larey Dresser, MD (Cardiology) Ailene Rud, MD as Attending Physician (Urology)  CHIEF COMPLAINTS/PURPOSE OF VISIT:    Primary site: Melanoma of the Skin   Staging method: AJCC 7th Edition   Clinical: Stage IB (T2a, N0, M0) signed by Heath Lark, MD on 09/17/2013  8:46 PM   Summary: Stage IB (T2a, N0, M0)  HISTORY OF PRESENTING ILLNESS:  Mitchell [name] 68 y.o. male was transferred to my care after his prior physician has left. His cancer was diagnosed after his PCP found an abnormal mole. The pathology indicated that it was Clark level IV, Breslow thickness 1.75 mm with mitotic figures 20/mm sq without ulceration, without lymphatic invasion, without perineural invasion, without microscopic satellitosis. He underwent wide local excision followed by sentinel node biopsy without residual cancer or positive nodes.  He was observed.    The patient had excessive exposure of sun as a child. There is positive family history of skin cancer in his uncle but not melanoma. He is vigilant about skin protection and follows with his dermatologist regularly. He denies recent diagnosis of other skin cancer. Denies any new lymphadenopathy.   MEDICAL HISTORY:  Past Medical History  Diagnosis Date  . CAD (coronary artery disease)     patient had non-ST-elevation MI in February 2008 and there was an 80-90% proximal LAD stenosis, 90% proximal first diagonal stenosis, 99% ostial ramus stenosis. The first obtuse marginal was subtotally occluded and there was an 80% mid RCA stenosis. EF was estimated 25% on ventriculogram, coronary artery bypass grafting was done with a LIMA to the LAD, sequential saphenous vein graft to the PDA,  . Aortic insufficiency   . AAA (abdominal aortic aneurysm)     ultrasound done in 3/11 and showed AAA 3.9 x  3.9cm  . HTN (hypertension)   . DM2 (diabetes mellitus, type 2)   . Carotid stenosis   . Torn rotator cuff     hx of. left   . History of PFTs     obstructive/restrictive. There is some reversibility with bronchodilator. The patient actually never smoked. He is on Spiriva  . Cancer     melanoma  . Altered taste   . Pain     back  . COPD (chronic obstructive pulmonary disease)   . Melanoma 09/2010    pT2a N0 M0; clark level IV.   Marland Kitchen Range of motion deficit 09/19/2011  . Myocardial infarction   . Diabetes mellitus   . Irregular heart beat   . Anxiety   . Depression   . Shortness of breath     with exeration    SURGICAL HISTORY: Past Surgical History  Procedure Laterality Date  . Tonsillectomy  1951  . Arterial bypass surgry    . Melanoma excision    . Blood clots removed    . Application of wound vac    . Coronary artery bypass graft  07/2006  . Tonsillectomy    . Abdominal aortic endovascular stent graft N/A 12/11/2012    Procedure: ABDOMINAL AORTIC ENDOVASCULAR STENT GRAFT GORE;  Surgeon: Serafina Mitchell, MD;  Location: [facility] [place];  Service: Vascular;  Laterality: N/A;    SOCIAL HISTORY: History   Social History  . Marital Status: Divorced    Spouse Name: N/A    Number of Children: N/A  . Years of Education: N/A  Occupational History  . Not on file.   Social History Main Topics  . Smoking status: Never Smoker   . Smokeless tobacco: Never Used  . Alcohol Use: No  . Drug Use: No  . Sexual Activity: Not on file   Other Topics Concern  . Not on file   Social History Narrative  . No narrative on file    FAMILY HISTORY: Family History  Problem Relation Age of Onset  . Hyperlipidemia Mother   . Hypertension Mother   . Hypertension Father   . Other Father     AAA  . Heart disease Father   . Cancer Maternal Uncle     throat ca    ALLERGIES:  is allergic to zocor.  MEDICATIONS:  Current Outpatient Prescriptions  Medication Sig Dispense Refill  .  acetaminophen (TYLENOL) 325 MG tablet Take 650 mg by mouth every 6 (six) hours as needed for pain.       Marland Kitchen amLODipine (NORVASC) 10 MG tablet Take 10 mg by mouth daily.       Marland Kitchen aspirin 81 MG chewable tablet Chew 81 mg by mouth daily.      Marland Kitchen atorvastatin (LIPITOR) 40 MG tablet Take 40 mg by mouth daily.      . benazepril (LOTENSIN) 40 MG tablet Take 40 mg by mouth daily.       . carvedilol (COREG) 25 MG tablet Take 1 tablet (25 mg total) by mouth 2 (two) times daily.      . chlorthalidone (HYGROTON) 25 MG tablet Take 1 tablet (25 mg total) by mouth daily.  90 tablet  3  . clopidogrel (PLAVIX) 75 MG tablet TAKE ONE TABLET BY MOUTH ONCE DAILY  30 tablet  3  . Coenzyme Q10 200 MG capsule Take 200 mg by mouth daily.      [name] Sodium (COLACE PO) Take by mouth 2 (two) times daily.      Marland Kitchen glipiZIDE (GLUCOTROL) 10 MG tablet Take 10 mg by mouth daily.       . metFORMIN (GLUMETZA) 500 MG (MOD) 24 hr tablet Take 500 mg by mouth daily with breakfast.      . Multiple Vitamin (MULTIVITAMIN) tablet Take 1 tablet by mouth daily.        . nortriptyline (PAMELOR) 50 MG capsule Take 50 mg by mouth at bedtime.      . Omega-3 Fatty Acids (FISH OIL TRIPLE STRENGTH) 1400 MG CAPS Take 1,400 mg by mouth every morning.      . potassium chloride SA (K-DUR,KLOR-CON) 20 MEQ tablet Take 1 tablet (20 mEq total) by mouth 2 (two) times daily.  180 tablet  3  . sildenafil (VIAGRA) 50 MG tablet Take 50 mg by mouth daily as needed for erectile dysfunction.      Marland Kitchen tiotropium (SPIRIVA) 18 MCG inhalation capsule Place 18 mcg into inhaler and inhale daily.        . traZODone (DESYREL) 100 MG tablet Take 100 mg by mouth at bedtime.        No current facility-administered medications for this visit.    REVIEW OF SYSTEMS:   Constitutional: Denies fevers, chills or abnormal night sweats Eyes: Denies blurriness of vision, double vision or watery eyes Ears, nose, mouth, throat, and face: Denies mucositis or sore  throat Respiratory: Denies cough, dyspnea or wheezes Cardiovascular: Denies palpitation, chest discomfort or lower extremity swelling Gastrointestinal:  Denies nausea, heartburn or change in bowel habits Skin: Denies abnormal skin rashes Lymphatics: Denies new lymphadenopathy  or easy bruising Neurological:Denies numbness, tingling or new weaknesses Behavioral/Psych: Mood is stable, no new changes  All other systems were reviewed with the patient and are negative.  PHYSICAL EXAMINATION: ECOG PERFORMANCE STATUS: 0 - Asymptomatic  Filed Vitals:   09/17/13 1511  BP: 111/67  Pulse: 70  Temp: 98.7 F (37.1 C)  Resp: 20   Filed Weights   09/17/13 1511  Weight: 205 lb 14.4 oz (93.396 kg)    GENERAL:alert, no distress and comfortable SKIN: noted multiple skin moles but nothing suspicious for malignancy EYES: normal, conjunctiva are pink and non-injected, sclera clear OROPHARYNX:no exudate, normal lips, buccal mucosa, and tongue  NECK: supple, thyroid normal size, non-tender, without nodularity LYMPH:  no palpable lymphadenopathy in the cervical, axillary or inguinal LUNGS: clear to auscultation and percussion with normal breathing effort HEART: regular rate & rhythm and no murmurs without lower extremity edema ABDOMEN:abdomen soft, non-tender and normal bowel sounds Musculoskeletal:no cyanosis of digits and no clubbing  PSYCH: alert & oriented x 3 with fluent speech NEURO: no focal motor/sensory deficits  LABORATORY DATA:  I have reviewed the data as listed Lab Results  Component Value Date   WBC 7.1 09/17/2013   HGB 14.4 09/17/2013   HCT 42.4 09/17/2013   MCV 96.7 09/17/2013   PLT 175 09/17/2013    Recent Labs  09/18/12 1428  12/09/12 1255 12/11/12 1043 12/12/12 0528 07/13/13 1547 08/24/13 0752 09/17/13 1432  NA 138  < > 138 139 139  --  139 142  K 3.7  < > 3.6 3.8 3.0*  --  3.5 3.7  CL 101  < > 101 105 104  --  104  --   CO2 28  < > 24 26 26   --  28 27  GLUCOSE 105*  < >  154* 171* 123*  --  171* 94  BUN 18.2  < > 15 15 11 16 18  23.1  CREATININE 1.2  < > 0.94 1.08 1.01 1.03 1.2 1.3  CALCIUM 9.0  < > 9.1 9.0 7.9*  --  8.8 9.3  GFRNONAA  --   --  85* 69* 75*  --   --   --   GFRAA  --   --  >90 80* 87*  --   --   --   PROT 7.9  --  8.2  --   --   --   --  8.1  ALBUMIN 3.6  --  3.8  --   --   --   --  3.8  AST 19  --  21  --   --   --   --  20  ALT 20  --  20  --   --   --   --  18  ALKPHOS 50  --  55  --   --   --   --  57  BILITOT 0.62  --  0.3  --   --   --   --  0.46  < > = values in this interval not displayed.  ASSESSMENT & PLAN:  #1 Melanoma, no recurrence I recommend history, physical examination and blood work in 1 year. I stressed the importance of regular skin examination, avoidance of excessive skin exposure and to continue to follow-up with his dermatologist. I also recommend vitamin D supplementation.  Orders Placed This Encounter  Procedures  . CBC with Differential    Standing Status: Future     Number of Occurrences:  Standing Expiration Date: 09/17/2014  . Comprehensive metabolic panel    Standing Status: Future     Number of Occurrences:      Standing Expiration Date: 09/17/2014  . Lactate dehydrogenase    Standing Status: Future     Number of Occurrences:      Standing Expiration Date: 09/17/2014    All questions were answered. The patient knows to call the clinic with any problems, questions or concerns. I spent 15 minutes counseling the patient face to face. The total time spent in the appointment was 20 minutes and more than 50% was on counseling.     [facility], Nayelis Bonito, MD 09/17/2013 8:54 PM

## 2013-09-20 ENCOUNTER — Telehealth: Payer: Self-pay | Admitting: Hematology and Oncology

## 2013-09-20 NOTE — Telephone Encounter (Signed)
s.w. pt and advised on April 2016 appt.....pt ok and aware °

## 2014-01-02 ENCOUNTER — Other Ambulatory Visit: Payer: Self-pay | Admitting: Cardiology

## 2014-01-11 ENCOUNTER — Other Ambulatory Visit: Payer: Self-pay | Admitting: Surgery

## 2014-01-11 LAB — CREATININE, SERUM: CREATININE: 1.23 mg/dL (ref 0.50–1.35)

## 2014-01-11 LAB — BUN: BUN: 20 mg/dL (ref 6–23)

## 2014-01-12 ENCOUNTER — Encounter: Payer: Self-pay | Admitting: *Deleted

## 2014-01-14 ENCOUNTER — Encounter: Payer: Self-pay | Admitting: Surgery

## 2014-01-17 ENCOUNTER — Encounter: Payer: Self-pay | Admitting: Surgery

## 2014-01-17 ENCOUNTER — Ambulatory Visit (INDEPENDENT_AMBULATORY_CARE_PROVIDER_SITE_OTHER): Payer: Medicare Other | Admitting: Surgery

## 2014-01-17 ENCOUNTER — Ambulatory Visit
Admission: RE | Admit: 2014-01-17 | Discharge: 2014-01-17 | Disposition: A | Discharge status: Home / Self Care | Payer: Medicare Other | Source: Ambulatory Visit | Attending: Surgery | Admitting: Surgery

## 2014-01-17 VITALS — BP 119/64 | HR 67 | Ht 72.0 in | Wt 204.4 lb

## 2014-01-17 DIAGNOSIS — Z48812 Encounter for surgical aftercare following surgery on the circulatory system: Secondary | ICD-10-CM

## 2014-01-17 DIAGNOSIS — I714 Abdominal aortic aneurysm, without rupture, unspecified: Secondary | ICD-10-CM

## 2014-01-17 MED ORDER — IOHEXOL 350 MG/ML SOLN
80.0000 mL | Freq: Once | INTRAVENOUS | Status: AC | PRN
  Administered 2014-01-17: 80 mL via INTRAVENOUS

## 2014-01-17 NOTE — Addendum Note (Signed)
Addended by: Mena Goes on: 01/17/2014 04:41 PM   Modules accepted: Orders

## 2014-01-17 NOTE — Progress Notes (Signed)
Patient name: Mitchell [name] MRN: [id number] DOB: 12-Jun-1946 Sex: male     Chief Complaint  Patient presents with  . Re-evaluation    6 month f/u CTA prior    HISTORY OF PRESENT ILLNESS: The patient comes in today for followup. 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  He has a known type II endoleak.  He reports symptoms of a sinus infection today.  He denies any abdominal or back pain.  The patient also suffers from carotid stenosis, which is asymptomatic.  He remained so today.  Specifically, he denies numbness or weakness in either extremity.  He denies slurred speech.  He denies amaurosis fugax.  He continues to be medically managed for hypercholesterolemia Lithostat.  He is on multiple agents for blood pressure treatment.   Past Medical History  Diagnosis Date  . CAD (coronary artery disease)     patient had non-ST-elevation MI in February 2008 and there was an 80-90% proximal LAD stenosis, 90% proximal first diagonal stenosis, 99% ostial ramus stenosis. The first obtuse marginal was subtotally occluded and there was an 80% mid RCA stenosis. EF was estimated 25% on ventriculogram, coronary artery bypass grafting was done with a LIMA to the LAD, sequential saphenous vein graft to the PDA,  . Aortic insufficiency   . AAA (abdominal aortic aneurysm)     ultrasound done in 3/11 and showed AAA 3.9 x 3.9cm  . HTN (hypertension)   . DM2 (diabetes mellitus, type 2)   . Carotid stenosis   . Torn rotator cuff     hx of. left   . History of PFTs     obstructive/restrictive. There is some reversibility with bronchodilator. The patient actually never smoked. He is on Spiriva  . Cancer     melanoma  . Altered taste   . Pain     back  . COPD (chronic obstructive pulmonary disease)   . Melanoma 09/2010    pT2a N0 M0; clark level IV.   Marland Kitchen Range of motion deficit 09/19/2011  . Myocardial infarction   . Diabetes mellitus   . Irregular  heart beat   . Anxiety   . Depression   . Shortness of breath     with exeration    Past Surgical History  Procedure Laterality Date  . Tonsillectomy  1951  . Arterial bypass surgry    . Melanoma excision    . Blood clots removed    . Application of wound vac    . Coronary artery bypass graft  07/2006  . Tonsillectomy    . Abdominal aortic endovascular stent graft N/A 12/11/2012    Procedure: ABDOMINAL AORTIC ENDOVASCULAR STENT GRAFT GORE;  Surgeon: Serafina Mitchell, MD;  Location: [place];  Service: Vascular;  Laterality: N/A;    History   Social History  . Marital Status: Divorced    Spouse Name: N/A    Number of Children: N/A  . Years of Education: N/A   Occupational History  . Not on file.   Social History Main Topics  . Smoking status: Never Smoker   . Smokeless tobacco: Never Used  . Alcohol Use: No  . Drug Use: No  . Sexual Activity: Not on file   Other Topics Concern  . Not on file   Social History Narrative  . No narrative on file    Family History  Problem Relation Age of Onset  . Hyperlipidemia Mother   . Hypertension Mother   .  Hypertension Father   . AAA (abdominal aortic aneurysm) Father   . Heart disease Father   . Throat cancer Maternal Uncle     Allergies as of 01/17/2014 - Review Complete 01/17/2014  Allergen Reaction Noted  . Zocor [simvastatin] Other (See Comments) 09/18/2012    Current Outpatient Prescriptions on File Prior to Visit  Medication Sig Dispense Refill  . acetaminophen (TYLENOL) 325 MG tablet Take 650 mg by mouth every 6 (six) hours as needed for pain.       Marland Kitchen amLODipine (NORVASC) 10 MG tablet Take 10 mg by mouth daily.       Marland Kitchen aspirin 81 MG chewable tablet Chew 81 mg by mouth daily.      Marland Kitchen atorvastatin (LIPITOR) 40 MG tablet Take 40 mg by mouth daily.      . benazepril (LOTENSIN) 40 MG tablet Take 40 mg by mouth daily.       . carvedilol (COREG) 25 MG tablet Take 1 tablet (25 mg total) by mouth 2 (two) times daily.        . chlorthalidone (HYGROTON) 25 MG tablet Take 1 tablet (25 mg total) by mouth daily.  90 tablet  3  . clopidogrel (PLAVIX) 75 MG tablet TAKE ONE TABLET BY MOUTH ONCE DAILY  30 tablet  6  . Coenzyme Q10 200 MG capsule Take 200 mg by mouth daily.      [name] Sodium (COLACE PO) Take by mouth 2 (two) times daily.      Marland Kitchen glipiZIDE (GLUCOTROL) 10 MG tablet Take 10 mg by mouth daily.       . metFORMIN (GLUMETZA) 500 MG (MOD) 24 hr tablet Take 500 mg by mouth daily with breakfast.      . Multiple Vitamin (MULTIVITAMIN) tablet Take 1 tablet by mouth daily.        . nortriptyline (PAMELOR) 50 MG capsule Take 50 mg by mouth at bedtime.      . Omega-3 Fatty Acids (FISH OIL TRIPLE STRENGTH) 1400 MG CAPS Take 1,400 mg by mouth every morning.      . potassium chloride SA (K-DUR,KLOR-CON) 20 MEQ tablet Take 1 tablet (20 mEq total) by mouth 2 (two) times daily.  180 tablet  3  . sildenafil (VIAGRA) 50 MG tablet Take 50 mg by mouth daily as needed for erectile dysfunction.      Marland Kitchen tiotropium (SPIRIVA) 18 MCG inhalation capsule Place 18 mcg into inhaler and inhale daily.        . traZODone (DESYREL) 100 MG tablet Take 100 mg by mouth at bedtime.        No current facility-administered medications on file prior to visit.     REVIEW OF SYSTEMS: Cardiovascular: No chest pain, chest pressure,No claudication or rest pain,  No history of DVT or phlebitis.  Positive for shortness of breath with exertion Pulmonary: No productive cough, asthma or wheezing. Neurologic: No weakness, paresthesias, aphasia, or amaurosis. No dizziness. Hematologic: No bleeding problems or clotting disorders. Musculoskeletal: No joint pain or joint swelling. Gastrointestinal: No blood in stool or hematemesis Genitourinary: No dysuria or hematuria. Psychiatric:: No history of major depression. Integumentary: No rashes or ulcers. Constitutional: No fever or chills.  PHYSICAL EXAMINATION:   Vital signs are BP 119/64  Pulse 67  Ht  6' (1.829 m)  Wt 204 lb 6.4 oz (92.715 kg)  BMI 27.72 kg/m2  SpO2 99% General: The patient appears their stated age. HEENT:  No gross abnormalities Pulmonary:  Non labored breathing Abdomen: Soft and  non-tender.  No pulsatile mass Musculoskeletal: There are no major deformities. Neurologic: No focal weakness or paresthesias are detected, Skin: There are no ulcer or rashes noted. Psychiatric: The patient has normal affect. Cardiovascular: There is a regular rate and rhythm without significant murmur appreciated.  No carotid bruit   Diagnostic Studies I have reviewed his CT angiogram which shows a persistent type II endoleak with no change in the aneurysm sac, measurements today were 4.3 x 5.3.  Carotid occlusive disease: Dr. Trey Paula follows this with ultrasound.  Most recent studies in March of this year were 40-59% bilateral  Assessment: #1: Abdominal aortic aneurysm, status post endovascular repair #2: Carotid stenosis, asymptomatic Plan: #1: I discussed the persistent type II endoleak findings with the patient today.  We went over the management of this and he is in full understanding.  I'll repeat his CT scan in 6 months.  If this shows no significant change I will probably switched to ultrasound monitoring.  #2: Cardiology follows his carotid disease.  He remained asymptomatic.  Eldridge Abrahams, M.D. [facility] Office: [phone] Pager:  [phone]

## 2014-01-21 ENCOUNTER — Encounter: Payer: Self-pay | Admitting: Cardiology

## 2014-01-21 ENCOUNTER — Ambulatory Visit (INDEPENDENT_AMBULATORY_CARE_PROVIDER_SITE_OTHER): Payer: Medicare Other | Admitting: Cardiology

## 2014-01-21 VITALS — BP 132/88 | HR 63 | Ht 72.0 in | Wt 206.0 lb

## 2014-01-21 DIAGNOSIS — I6529 Occlusion and stenosis of unspecified carotid artery: Secondary | ICD-10-CM

## 2014-01-21 DIAGNOSIS — I5032 Chronic diastolic (congestive) heart failure: Secondary | ICD-10-CM

## 2014-01-21 DIAGNOSIS — I714 Abdominal aortic aneurysm, without rupture, unspecified: Secondary | ICD-10-CM

## 2014-01-21 DIAGNOSIS — I25810 Atherosclerosis of coronary artery bypass graft(s) without angina pectoris: Secondary | ICD-10-CM

## 2014-01-21 DIAGNOSIS — R0602 Shortness of breath: Secondary | ICD-10-CM

## 2014-01-21 LAB — BRAIN NATRIURETIC PEPTIDE: PRO B NATRI PEPTIDE: 33 pg/mL (ref 0.0–100.0)

## 2014-01-21 NOTE — Patient Instructions (Addendum)
The current medical regimen is effective;  continue present plan and medications.  Please have blood work today (BNP)  Your physician has requested that you have an echocardiogram. Echocardiography is a painless test that uses sound waves to create images of your heart. It provides your doctor with information about the size and shape of your heart and how well your heart's chambers and valves are working. This procedure takes approximately one hour. There are no restrictions for this procedure.  Follow up in 6 months with Dr Deatra Robinson.  You will receive a letter in the mail 2 months before you are due.  Please call us when you receive this letter to schedule your follow up appointment.

## 2014-01-23 NOTE — Progress Notes (Signed)
Patient ID: Mitchell [name], male   DOB: May 10, 1946, 68 y.o.   MRN: [id number] PCP: Dr. Arelia Sneddon  68 yo with h/o CAD s/p CABG, aortic aneurysm s/p repair, HTN, DM2 returns to cardiology clinic.  In 6/14 he had EVAR for repair of AAA.  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 Trula Slade.  He denies chest pain.  He has stable exertional dyspnea.  He is mildly short of breath walking up stairs and is short of breath walking 100-200 feet in [employer].  He is bothered by low back pain.  Weight is stable.   ECG: NSR, nonspecific T wave flattening.  Labs (8/10): K 3.8, creatinine 1.12, LDL 61, HDL 29 Labs (3/11): HDL 32, LDL 55, LFTs normal Labs (10/11): LDL 55, HDL 27, K 3.9, creatinine 1.0 Labs (4/12): LDL 53, HDL 27 Labs (10/12): LDL 49, HDL 39 Labs (4/13): LDL 42, HDL 31 Labs (10/13): K 3.7, creatinine 1.3 Labs (3/14): LDL 60, HDL 21 Labs (4/14): K 3.7, creatinine 1.2 Labs (6/14): K 3, creatinine 1  Labs (3/15): LDL 63, HDL 29 Labs (7/15): creatinine 1.2  Allergies (verified):  No Known Drug Allergies  Past Medical History: 1. Coronary artery disease.  The patient had non-ST-elevation MI in February 2008 and there was an 80-90% proximal LAD stenosis, 90%proximal first diagonal stenosis, 99% ostial ramus stenosis.  The first obtuse marginal was subtotally occluded and there was an 80% mid RCA stenosis.  EF was estimated 25% on ventriculogram, coronary artery bypass grafting was done with a LIMA to the LAD, sequential saphenous vein graft to the PDA, and PLOM, saphenous graft to the diagonal, saphenous vein graft to the ramus. 2. Aortic insufficiency: Echo (3/11): EF 55-60%, moderate diastolic dysfunction, moderate aortic insufficiency, moderate LAE.  Echo (3/12) with EF 50-55%, grade II diastolic dysfunction, mild MR, mild aortic insufficiency.  3. Infrarenal AAA. Abdominal US 3/11 with AAA 3.9 x 3.9 cm.  Abdominal US 3/12 with AAA 3.8 x 3.9 cm.  Abdominal US (3/13)  with AAA 4.6 x 3.9 with complex plaque within the AAA.  CTA abdomen showed 4 cm saccular infrarenal aneurysm.  CTA abdomen 5/14 showed saccular AAA grown to 5.3 cm. EVAR for repair in 6/14.  4. Hypertension: Renal artery dopplers in 5/13 with no significant renal artery stenosis.  5. Type 2 diabetes. 6. History of torn left rotator cuff. 7. History of obstructive/restrictive PFTs.  There is some reversibility with bronchodilator.  The patient actually never smoked.  He is on Spiriva. 8. Holter (3/10):  Frequent PACs and blocked PACs.  PVCs with trigeminy.  No long pauses.  Slowest HR in 40s while asleep.  9. Carotid stenosis.  Carotid dopplers (3/12) with stable 40-59% bilateral ICA stenosis.  Carotid dopplers (3/13) with 40-59% bilateral stenosis. Carotid dopplers (3/14) with 40-59% bilateral stenosis. Carotid dopplers (3/15) with 40-59% bilateral ICA stenosis. 10.  Melanoma: s/p excision from back 5/12.   Family History: Both the patient's mother and father had abdominal aortic aneurysm and both died due to complications with attempted AAA repair.  Social History: The patient is a lifetime nonsmoker. He is unemployed but draws a pension from the [place].  He lives by himself.  He has no close family left, he only has cousins. He plays the piano in church.   ROS:  All systems reviewed and negative except as per HPI.    Current Outpatient Prescriptions  Medication Sig Dispense Refill  . acetaminophen (TYLENOL) 325 MG tablet  Take 650 mg by mouth every 6 (six) hours as needed for pain.       Marland Kitchen amLODipine (NORVASC) 10 MG tablet Take 10 mg by mouth daily.       Marland Kitchen aspirin 81 MG chewable tablet Chew 81 mg by mouth daily.      Marland Kitchen atorvastatin (LIPITOR) 40 MG tablet Take 40 mg by mouth daily.      . benazepril (LOTENSIN) 40 MG tablet Take 40 mg by mouth daily.       . carvedilol (COREG) 25 MG tablet Take 1 tablet (25 mg total) by mouth 2 (two) times daily.      . chlorthalidone (HYGROTON) 25 MG tablet  Take 1 tablet (25 mg total) by mouth daily.  90 tablet  3  . clopidogrel (PLAVIX) 75 MG tablet TAKE ONE TABLET BY MOUTH ONCE DAILY  30 tablet  6  . Coenzyme Q10 200 MG capsule Take 200 mg by mouth daily.      [name] Sodium (COLACE PO) Take by mouth 2 (two) times daily.      Marland Kitchen gabapentin (NEURONTIN) 300 MG capsule Take 300 mg by mouth daily.      Marland Kitchen glipiZIDE (GLUCOTROL) 10 MG tablet Take 10 mg by mouth daily.       . metFORMIN (GLUMETZA) 500 MG (MOD) 24 hr tablet Take 500 mg by mouth daily with breakfast.      . Multiple Vitamin (MULTIVITAMIN) tablet Take 1 tablet by mouth daily.        . nortriptyline (PAMELOR) 50 MG capsule Take 50 mg by mouth at bedtime.      . Omega-3 Fatty Acids (FISH OIL TRIPLE STRENGTH) 1400 MG CAPS Take 1,400 mg by mouth every morning.      . potassium chloride SA (K-DUR,KLOR-CON) 20 MEQ tablet Take 1 tablet (20 mEq total) by mouth 2 (two) times daily.  180 tablet  3  . sildenafil (VIAGRA) 50 MG tablet Take 50 mg by mouth daily as needed for erectile dysfunction.      Marland Kitchen tiotropium (SPIRIVA) 18 MCG inhalation capsule Place 18 mcg into inhaler and inhale daily.        . traZODone (DESYREL) 100 MG tablet Take 100 mg by mouth at bedtime.        No current facility-administered medications for this visit.     BP 132/88  Pulse 63  Ht 6' (1.829 m)  Wt 206 lb (93.441 kg)  BMI 27.93 kg/m2 General:  Well developed, well nourished, in no acute distress. Neck:  Neck supple, no JVD. No masses, thyromegaly or abnormal cervical nodes. Lungs:  Slight crackles at bases.  Heart:  Non-displaced PMI, chest non-tender; regular rate and rhythm, S1, S2 without murmurs, rubs. +S4. Carotid upstroke normal, no bruit. Pedals normal pulses. 1+ bilateral ankle edema Abdomen:  Bowel sounds positive; abdomen soft and non-tender without masses, organomegaly, or hernias noted. No hepatosplenomegaly. Extremities:  No clubbing or cyanosis. Neurologic:  Alert and oriented x 3. Psych:  Normal  affect.  Assessment/Plan:  CORONARY ARTERY DISEASE Stable with no ischemic symptoms. Continue ASA, Plavix, statin, Coreg, ACEI.  HYPERLIPIDEMIA Good LDL on atorvastatin.  AAA  S/p repair by Dr. Trula Slade (EVAR). Has type II Endoleak, being followed.  CAROTID ARTERY STENOSIS  Stable moderate carotid stenosis. Repeat carotid dopplers in 3/16.  AORTIC INSUFFICIENCY  I will get an echocardiogram to reassess. HYPERTENSION BP better controlled.  No renal artery stenosis on dopplers in 5/13.  CHRONIC DIASTOLIC HEART FAILURE Stable dyspnea.  He does not appear particularly volume overloaded.  As above, I am getting a repeat echo. Will check BNP.    Loralie Champagne 01/23/2014

## 2014-01-31 ENCOUNTER — Ambulatory Visit (HOSPITAL_COMMUNITY): Payer: Medicare Other | Attending: Cardiology | Admitting: Radiology

## 2014-01-31 DIAGNOSIS — I2510 Atherosclerotic heart disease of native coronary artery without angina pectoris: Secondary | ICD-10-CM | POA: Diagnosis present

## 2014-01-31 DIAGNOSIS — R0602 Shortness of breath: Secondary | ICD-10-CM

## 2014-01-31 NOTE — Progress Notes (Signed)
Echocardiogram performed.  

## 2014-05-25 ENCOUNTER — Ambulatory Visit (INDEPENDENT_AMBULATORY_CARE_PROVIDER_SITE_OTHER): Payer: Medicare Other | Admitting: Cardiology

## 2014-05-25 ENCOUNTER — Encounter: Payer: Self-pay | Admitting: Cardiology

## 2014-05-25 ENCOUNTER — Encounter: Payer: Self-pay | Admitting: *Deleted

## 2014-05-25 VITALS — BP 146/82 | HR 74 | Ht 72.0 in | Wt 199.0 lb

## 2014-05-25 DIAGNOSIS — I2510 Atherosclerotic heart disease of native coronary artery without angina pectoris: Secondary | ICD-10-CM

## 2014-05-25 DIAGNOSIS — M542 Cervicalgia: Secondary | ICD-10-CM

## 2014-05-25 DIAGNOSIS — I5032 Chronic diastolic (congestive) heart failure: Secondary | ICD-10-CM

## 2014-05-25 DIAGNOSIS — M79602 Pain in left arm: Secondary | ICD-10-CM

## 2014-05-25 DIAGNOSIS — I359 Nonrheumatic aortic valve disorder, unspecified: Secondary | ICD-10-CM

## 2014-05-25 DIAGNOSIS — I6523 Occlusion and stenosis of bilateral carotid arteries: Secondary | ICD-10-CM

## 2014-05-25 DIAGNOSIS — I25810 Atherosclerosis of coronary artery bypass graft(s) without angina pectoris: Secondary | ICD-10-CM

## 2014-05-25 NOTE — Patient Instructions (Addendum)
Your physician has requested that you have en exercise stress myoview. For further information please visit [email or web address]. Please follow instruction sheet, as given.  Dr Aundra Dubin has recommended you have an xray of your cervical spine(neck) because you are having neck pain.  Your physician has requested that you have a carotid duplex. This test is an ultrasound of the carotid arteries in your neck. It looks at blood flow through these arteries that supply the brain with blood. Allow one hour for this exam. There are no restrictions or special instructions. MARCH 2016  Your physician wants you to follow-up in: 6 months with Dr Aundra Dubin. (June  2016).  You will receive a reminder letter in the mail two months in advance. If you don't receive a letter, please call our office to schedule the follow-up appointment.

## 2014-05-26 ENCOUNTER — Ambulatory Visit (HOSPITAL_COMMUNITY)
Admission: RE | Admit: 2014-05-26 | Discharge: 2014-05-26 | Disposition: A | Discharge status: Home / Self Care | Payer: Medicare Other | Source: Ambulatory Visit | Attending: Cardiology | Admitting: Cardiology

## 2014-05-26 DIAGNOSIS — M542 Cervicalgia: Secondary | ICD-10-CM

## 2014-05-26 DIAGNOSIS — M47892 Other spondylosis, cervical region: Secondary | ICD-10-CM | POA: Diagnosis not present

## 2014-05-26 DIAGNOSIS — M79602 Pain in left arm: Secondary | ICD-10-CM | POA: Diagnosis present

## 2014-05-26 DIAGNOSIS — I6523 Occlusion and stenosis of bilateral carotid arteries: Secondary | ICD-10-CM

## 2014-05-26 NOTE — Progress Notes (Signed)
Patient ID: Mitchell [name], male   DOB: 11-Dec-1945, 69 y.o.   MRN: [id number] PCP: Dr. Arelia Sneddon  68 yo with h/o CAD s/p CABG, aortic aneurysm s/p repair, HTN, DM2 returns to cardiology clinic.  In 6/14 he had EVAR for repair of AAA.  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 Trula Slade.  He denies chest pain.  He has stable exertional dyspnea.  He is mildly short of breath walking up stairs and is short of breath walking 100-200 feet in [employer].  He is bothered by low back pain.  Weight is down 7 lbs.   Main complaint today is dull aching pain in his left arm.  This has been present for about 2 weeks.  It waxes and wanes most of the day.  It is not related to exertion.  He feels like his left arm strength is also decreased.  No neck pain.   Labs (8/10): K 3.8, creatinine 1.12, LDL 61, HDL 29 Labs (3/11): HDL 32, LDL 55, LFTs normal Labs (10/11): LDL 55, HDL 27, K 3.9, creatinine 1.0 Labs (4/12): LDL 53, HDL 27 Labs (10/12): LDL 49, HDL 39 Labs (4/13): LDL 42, HDL 31 Labs (10/13): K 3.7, creatinine 1.3 Labs (3/14): LDL 60, HDL 21 Labs (4/14): K 3.7, creatinine 1.2 Labs (6/14): K 3, creatinine 1  Labs (3/15): LDL 63, HDL 29 Labs (7/15): creatinine 1.2 Labs (8/15): BNP normal  Allergies (verified):  No Known Drug Allergies  Past Medical History: 1. Coronary artery disease.  The patient had non-ST-elevation MI in February 2008 and there was an 80-90% proximal LAD stenosis, 90%proximal first diagonal stenosis, 99% ostial ramus stenosis.  The first obtuse marginal was subtotally occluded and there was an 80% mid RCA stenosis.  EF was estimated 25% on ventriculogram, coronary artery bypass grafting was done with a LIMA to the LAD, sequential saphenous vein graft to the PDA, and PLOM, saphenous graft to the diagonal, saphenous vein graft to the ramus. 2. Aortic insufficiency: Echo (3/11): EF 55-60%, moderate diastolic dysfunction, moderate aortic insufficiency, moderate  LAE.  Echo (3/12) with EF 50-55%, grade II diastolic dysfunction, mild MR, mild aortic insufficiency.  Echo (9/15) with EF 55-60%, mild AI, mild MR, mildly dilated RV with normal systolic function.  3. Infrarenal AAA. Abdominal US 3/11 with AAA 3.9 x 3.9 cm.  Abdominal US 3/12 with AAA 3.8 x 3.9 cm.  Abdominal US (3/13) with AAA 4.6 x 3.9 with complex plaque within the AAA.  CTA abdomen showed 4 cm saccular infrarenal aneurysm.  CTA abdomen 5/14 showed saccular AAA grown to 5.3 cm. EVAR for repair in 6/14.  4. Hypertension: Renal artery dopplers in 5/13 with no significant renal artery stenosis.  5. Type 2 diabetes. 6. History of torn left rotator cuff. 7. History of obstructive/restrictive PFTs.  There is some reversibility with bronchodilator.  The patient actually never smoked.  He is on Spiriva. 8. Holter (3/10):  Frequent PACs and blocked PACs.  PVCs with trigeminy.  No long pauses.  Slowest HR in 40s while asleep.  9. Carotid stenosis.  Carotid dopplers (3/12) with stable 40-59% bilateral ICA stenosis.  Carotid dopplers (3/13) with 40-59% bilateral stenosis. Carotid dopplers (3/14) with 40-59% bilateral stenosis. Carotid dopplers (3/15) with 40-59% bilateral ICA stenosis. 10.  Melanoma: s/p excision from back 5/12.   Family History: Both the patient's mother and father had abdominal aortic aneurysm and both died due to complications with attempted AAA repair.  Social History:  The patient is a lifetime nonsmoker. He is unemployed but draws a pension from the [place].  He lives by himself.  He has no close family left, he only has cousins. He plays the piano in church.   ROS:  All systems reviewed and negative except as per HPI.    Current Outpatient Prescriptions  Medication Sig Dispense Refill  . acetaminophen (TYLENOL) 325 MG tablet Take 650 mg by mouth every 6 (six) hours as needed for pain.     Marland Kitchen amLODipine (NORVASC) 10 MG tablet Take 10 mg by mouth daily.     Marland Kitchen aspirin 81 MG chewable  tablet Chew 81 mg by mouth daily.    Marland Kitchen atorvastatin (LIPITOR) 40 MG tablet Take 40 mg by mouth daily.    . benazepril (LOTENSIN) 40 MG tablet Take 40 mg by mouth daily.     . carvedilol (COREG) 25 MG tablet Take 1 tablet (25 mg total) by mouth 2 (two) times daily.    . chlorthalidone (HYGROTON) 25 MG tablet Take 1 tablet (25 mg total) by mouth daily. 90 tablet 3  . clopidogrel (PLAVIX) 75 MG tablet TAKE ONE TABLET BY MOUTH ONCE DAILY 30 tablet 6  . Coenzyme Q10 200 MG capsule Take 200 mg by mouth daily.    [name] Sodium (COLACE PO) Take by mouth 2 (two) times daily.    Marland Kitchen glipiZIDE (GLUCOTROL) 10 MG tablet Take 10 mg by mouth daily.     Marland Kitchen HYDROcodone-acetaminophen (NORCO/VICODIN) 5-325 MG per tablet Take 1-2 tablets by mouth every 4 (four) hours as needed.     . metFORMIN (GLUMETZA) 500 MG (MOD) 24 hr tablet Take 500 mg by mouth daily with breakfast.    . Multiple Vitamin (MULTIVITAMIN) tablet Take 1 tablet by mouth daily.      . nortriptyline (PAMELOR) 50 MG capsule Take 50 mg by mouth at bedtime.    . Omega-3 Fatty Acids (FISH OIL TRIPLE STRENGTH) 1400 MG CAPS Take 1,400 mg by mouth every morning.    . potassium chloride SA (K-DUR,KLOR-CON) 20 MEQ tablet Take 1 tablet (20 mEq total) by mouth 2 (two) times daily. 180 tablet 3  . sildenafil (VIAGRA) 50 MG tablet Take 50 mg by mouth daily as needed for erectile dysfunction.    Marland Kitchen tiotropium (SPIRIVA) 18 MCG inhalation capsule Place 18 mcg into inhaler and inhale daily.      . traZODone (DESYREL) 100 MG tablet Take 100 mg by mouth at bedtime.      No current facility-administered medications for this visit.     BP 146/82 mmHg  Pulse 74  Ht 6' (1.829 m)  Wt 199 lb (90.266 kg)  BMI 26.98 kg/m2  SpO2 96% General:  Well developed, well nourished, in no acute distress. Neck:  Neck supple, no JVD. No masses, thyromegaly or abnormal cervical nodes. Lungs:  Slight crackles at bases.  Heart:  Non-displaced PMI, chest non-tender; regular rate  and rhythm, S1, S2 without murmurs, rubs. +S4. Carotid upstroke normal, no bruit. Pedals normal pulses. 1+ bilateral ankle edema Abdomen:  Bowel sounds positive; abdomen soft and non-tender without masses, organomegaly, or hernias noted. No hepatosplenomegaly. Extremities:  No clubbing or cyanosis. Neurologic:  Alert and oriented x 3. Psych:  Normal affect.  Assessment/Plan:  CORONARY ARTERY DISEASE No chest pain but having a dull aching on and off in his left arm.  He is concerned this could be his heart.  Pain is not exertional.  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  - Continue ASA, Plavix, statin, Coreg, ACEI.  - ETT-Cardiolite - If Cardiolite is normal, she should get C-spine plain films.  HYPERLIPIDEMIA Good LDL on atorvastatin.  AAA  S/p repair by Dr. Trula Slade (EVAR). Has type II Endoleak, being followed.  CAROTID ARTERY STENOSIS  Stable moderate carotid stenosis. Repeat carotid dopplers in 3/16.  AORTIC INSUFFICIENCY  Mild on last echo.  HYPERTENSION BP better controlled.  No renal artery stenosis on dopplers in 5/13.  CHRONIC DIASTOLIC HEART FAILURE Stable dyspnea.  He does not appear particularly volume overloaded.  BNP normal in 8/15.    Loralie Champagne 05/26/2014

## 2014-06-01 ENCOUNTER — Ambulatory Visit (HOSPITAL_COMMUNITY): Payer: Medicare Other | Attending: Cardiology | Admitting: Radiology

## 2014-06-01 DIAGNOSIS — M79602 Pain in left arm: Secondary | ICD-10-CM

## 2014-06-01 DIAGNOSIS — Z6826 Body mass index (BMI) 26.0-26.9, adult: Secondary | ICD-10-CM | POA: Diagnosis not present

## 2014-06-01 DIAGNOSIS — I2510 Atherosclerotic heart disease of native coronary artery without angina pectoris: Secondary | ICD-10-CM | POA: Diagnosis not present

## 2014-06-01 DIAGNOSIS — E119 Type 2 diabetes mellitus without complications: Secondary | ICD-10-CM | POA: Insufficient documentation

## 2014-06-01 DIAGNOSIS — R0600 Dyspnea, unspecified: Secondary | ICD-10-CM | POA: Diagnosis not present

## 2014-06-01 DIAGNOSIS — J449 Chronic obstructive pulmonary disease, unspecified: Secondary | ICD-10-CM | POA: Insufficient documentation

## 2014-06-01 DIAGNOSIS — I10 Essential (primary) hypertension: Secondary | ICD-10-CM | POA: Diagnosis not present

## 2014-06-01 DIAGNOSIS — Z951 Presence of aortocoronary bypass graft: Secondary | ICD-10-CM | POA: Insufficient documentation

## 2014-06-01 DIAGNOSIS — Z959 Presence of cardiac and vascular implant and graft, unspecified: Secondary | ICD-10-CM | POA: Diagnosis present

## 2014-06-01 DIAGNOSIS — Z09 Encounter for follow-up examination after completed treatment for conditions other than malignant neoplasm: Secondary | ICD-10-CM | POA: Insufficient documentation

## 2014-06-01 DIAGNOSIS — I6523 Occlusion and stenosis of bilateral carotid arteries: Secondary | ICD-10-CM

## 2014-06-01 DIAGNOSIS — M542 Cervicalgia: Secondary | ICD-10-CM

## 2014-06-01 MED ORDER — TECHNETIUM TC 99M SESTAMIBI GENERIC - CARDIOLITE
30.0000 | Freq: Once | INTRAVENOUS | Status: AC | PRN
  Administered 2014-06-01: 30 via INTRAVENOUS

## 2014-06-01 MED ORDER — TECHNETIUM TC 99M SESTAMIBI GENERIC - CARDIOLITE
10.0000 | Freq: Once | INTRAVENOUS | Status: AC | PRN
  Administered 2014-06-01: 10 via INTRAVENOUS

## 2014-06-01 NOTE — Progress Notes (Addendum)
[facility] [address] [place], [place] [address] [phone]    Cardiology Nuclear Med Study  Mitchell [name] is a 68 y.o. male     MRN : [id number]     DOB: 05-07-1946  Procedure Date: 06/01/2014  Nuclear Med Background Indication for Stress Test:  Evaluation for Ischemia and Graft Patency History:  COPD and CAD-CABG, AAA Repair Cardiac Risk Factors: Carotid Disease, Hypertension, Lipids and NIDDM  Symptoms:  DOE and Fatigue   Nuclear Pre-Procedure Caffeine/Decaff Intake:  None> 12 hrs NPO After: 4:00am   Lungs:  clear O2 Sat: 95% on room air. IV 0.9% NS with Angio Cath:  22g  IV Site: R Wrist x 1, tolerated well IV Started by:  Irven Baltimore, RN  Chest Size (in):  44 Cup Size: n/a  Height: 6' (1.829 m)  Weight:  197 lb (89.359 kg)  BMI:  Body mass index is 26.71 kg/(m^2). Tech Comments:  Patient held Diabetic medications this am, and held Coreg x 24 hrs. Irven Baltimore, RN.    Nuclear Med Study 1 or 2 day study: 1 day  Stress Test Type:  Stress  Reading MD: N/A  Order Authorizing Provider:  Loralie Champagne, MD  Resting Radionuclide: Technetium 56m Sestamibi  Resting Radionuclide Dose: 11.0 mCi   Stress Radionuclide:  Technetium 67m Sestamibi  Stress Radionuclide Dose: 33.0 mCi           Stress Protocol Rest HR: 62 Stress HR: 142  Rest BP: 139/75 Stress BP: 178/86  Exercise Time (min): 3:46 METS: 5.50   Predicted Max HR: 151 bpm % Max HR: 94.04 bpm Rate Pressure Product: 25276   Dose of Adenosine (mg):  n/a Dose of Lexiscan: n/a mg  Dose of Atropine (mg): n/a Dose of Dobutamine: n/a mcg/kg/min (at max HR)  Stress Test Technologist: Perrin Maltese, EMT-P  Nuclear Technologist:  Earl Many, CNMT     Rest Procedure:  Myocardial perfusion imaging was performed at rest 45 minutes following the intravenous administration of Technetium 64m Sestamibi. Rest ECG: NSR - Normal EKG  Stress Procedure:  The patient exercised on the  treadmill utilizing the Bruce Protocol for 3:46 minutes. The patient stopped due to sob, fatigue, and denied any chest pain.  Technetium 57m Sestamibi was injected at peak exercise and myocardial perfusion imaging was performed after a brief delay. Stress ECG: No significant change from baseline ECG  QPS Raw Data Images:  Normal; no motion artifact; normal heart/lung ratio. Stress Images:  Normal homogeneous uptake in all areas of the myocardium. Rest Images:  Normal homogeneous uptake in all areas of the myocardium. Subtraction (SDS):  No evidence of ischemia. Transient Ischemic Dilatation (Normal <1.22):  0.83 Lung/Heart Ratio (Normal <0.45):  0.34  Quantitative Gated Spect Images QGS EDV:  81 ml QGS ESV:  39 ml  Impression Exercise Capacity:  Poor exercise capacity. BP Response:  Normal blood pressure response. Clinical Symptoms:  The exercise was limited by dyspnea and fatigue. ECG Impression:  No significant ST segment change suggestive of ischemia. Comparison with Prior Nuclear Study: No previous nuclear study performed  Overall Impression:  Poor exercise capacity, but otherwise low risk stress nuclear study with borderline reduction in overall systolic function. Consider nonischemic cardiomyopathy.  LV Ejection Fraction: 52%.  LV Wall Motion:  Mild global hypokinesis and borderline LVEF  Sanda Klein, MD, [facility] [phone] office [phone] pager  EF mildly reduced but no evidence for ischemia.  Low risk.  Loralie Champagne 06/02/2014

## 2014-06-03 NOTE — Progress Notes (Signed)
Pt.notified

## 2014-06-13 ENCOUNTER — Telehealth: Payer: Self-pay | Admitting: Cardiology

## 2014-06-13 ENCOUNTER — Other Ambulatory Visit: Payer: Self-pay

## 2014-06-13 DIAGNOSIS — M5410 Radiculopathy, site unspecified: Secondary | ICD-10-CM

## 2014-06-13 NOTE — Telephone Encounter (Signed)
Patient informed. Will put a referral in Epic for this. Will need to fax records.

## 2014-06-13 NOTE — Telephone Encounter (Signed)
He would be good person to see regarding C-spine.  Please make referral.

## 2014-06-13 NOTE — Telephone Encounter (Signed)
Per pt call - states he is still having pain and his left arm going to sleep.  Has cervical spine Xray 12/10 and wants to know if Dr Ronnald Ramp with [facility] would be a good MD to see for this.  Wants referral from Dr Aundra Dubin.  Pt aware information will be forwarded to Dr Aundra Dubin for review and further orders

## 2014-06-13 NOTE — Telephone Encounter (Signed)
New message    Referral to neuro .

## 2014-06-20 ENCOUNTER — Telehealth: Payer: Self-pay | Admitting: *Deleted

## 2014-06-20 NOTE — Telephone Encounter (Signed)
-----   Message from Philbert Riser sent at 06/16/2014  2:44 PM EST ----- Regarding: NEUROSUR.APPOINT 06/16/14 Appointment with Dr.Jones on 07/05/14.

## 2014-07-12 ENCOUNTER — Other Ambulatory Visit: Payer: Self-pay | Admitting: Surgery

## 2014-07-12 LAB — BUN: BUN: 21 mg/dL (ref 6–23)

## 2014-07-12 LAB — CREATININE, SERUM: Creat: 1.19 mg/dL (ref 0.50–1.35)

## 2014-07-14 ENCOUNTER — Encounter: Payer: Self-pay | Admitting: Surgery

## 2014-07-18 ENCOUNTER — Encounter: Payer: Self-pay | Admitting: Surgery

## 2014-07-18 ENCOUNTER — Other Ambulatory Visit: Payer: Self-pay | Admitting: *Deleted

## 2014-07-18 ENCOUNTER — Ambulatory Visit (INDEPENDENT_AMBULATORY_CARE_PROVIDER_SITE_OTHER): Payer: Medicare Other | Admitting: Surgery

## 2014-07-18 ENCOUNTER — Ambulatory Visit
Admission: RE | Admit: 2014-07-18 | Discharge: 2014-07-18 | Disposition: A | Discharge status: Home / Self Care | Payer: Medicare Other | Source: Ambulatory Visit | Attending: Surgery | Admitting: Surgery

## 2014-07-18 VITALS — BP 120/59 | HR 56 | Ht 72.0 in | Wt 204.0 lb

## 2014-07-18 DIAGNOSIS — I714 Abdominal aortic aneurysm, without rupture, unspecified: Secondary | ICD-10-CM

## 2014-07-18 DIAGNOSIS — Z48812 Encounter for surgical aftercare following surgery on the circulatory system: Secondary | ICD-10-CM

## 2014-07-18 MED ORDER — IOHEXOL 350 MG/ML SOLN
75.0000 mL | Freq: Once | INTRAVENOUS | Status: AC | PRN
  Administered 2014-07-18: 75 mL via INTRAVENOUS

## 2014-07-18 NOTE — Progress Notes (Signed)
Patient name: Mitchell [name] MRN: [id number] DOB: 1945/07/21 Sex: male     Chief Complaint  Patient presents with  . Re-evaluation    6 month f/u     HISTORY OF PRESENT ILLNESS: The patient comes in today for followup. 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 He has a known type II endoleak. He reports symptoms of a sinus infection today. He denies any abdominal or back pain. The patient also suffers from carotid stenosis, which is asymptomatic.  The patient does complain of numbness and weakness in his left arm which has been attributed to arthritis in his neck and a nerve injury.  He recently had aMRI and is scheduled to see a neurosurgeon next week  Past Medical History  Diagnosis Date  . CAD (coronary artery disease)     patient had non-ST-elevation MI in February 2008 and there was an 80-90% proximal LAD stenosis, 90% proximal first diagonal stenosis, 99% ostial ramus stenosis. The first obtuse marginal was subtotally occluded and there was an 80% mid RCA stenosis. EF was estimated 25% on ventriculogram, coronary artery bypass grafting was done with a LIMA to the LAD, sequential saphenous vein graft to the PDA,  . Aortic insufficiency   . AAA (abdominal aortic aneurysm)     ultrasound done in 3/11 and showed AAA 3.9 x 3.9cm  . HTN (hypertension)   . DM2 (diabetes mellitus, type 2)   . Carotid stenosis   . Torn rotator cuff     hx of. left   . History of PFTs     obstructive/restrictive. There is some reversibility with bronchodilator. The patient actually never smoked. He is on Spiriva  . Cancer     melanoma  . Altered taste   . Pain     back  . COPD (chronic obstructive pulmonary disease)   . Melanoma 09/2010    pT2a N0 M0; clark level IV.   Marland Kitchen Range of motion deficit 09/19/2011  . Myocardial infarction   . Diabetes mellitus   . Irregular heart beat   . Anxiety   . Depression   . Shortness of breath     with  exeration  . CHF (congestive heart failure)     Past Surgical History  Procedure Laterality Date  . Tonsillectomy  1951  . Arterial bypass surgry    . Melanoma excision    . Blood clots removed    . Application of wound vac    . Coronary artery bypass graft  07/2006  . Tonsillectomy    . Abdominal aortic endovascular stent graft N/A 12/11/2012    Procedure: ABDOMINAL AORTIC ENDOVASCULAR STENT GRAFT GORE;  Surgeon: Serafina Mitchell, MD;  Location: [facility] OR;  Service: Vascular;  Laterality: N/A;  . Abdominal aortic aneurysm repair      History   Social History  . Marital Status: Divorced    Spouse Name: N/A    Number of Children: N/A  . Years of Education: N/A   Occupational History  . Not on file.   Social History Main Topics  . Smoking status: Never Smoker   . Smokeless tobacco: Never Used  . Alcohol Use: No  . Drug Use: No  . Sexual Activity: Not on file   Other Topics Concern  . Not on file   Social History Narrative    Family History  Problem Relation Age of Onset  . Hyperlipidemia Mother   . Hypertension Mother   .  Heart disease Mother   . Hypertension Father   . AAA (abdominal aortic aneurysm) Father   . Heart disease Father   . Hyperlipidemia Father   . Throat cancer Maternal Uncle     Allergies as of 07/18/2014 - Review Complete 07/18/2014  Allergen Reaction Noted  . Zocor [simvastatin] Other (See Comments) 09/18/2012    Current Outpatient Prescriptions on File Prior to Visit  Medication Sig Dispense Refill  . acetaminophen (TYLENOL) 325 MG tablet Take 650 mg by mouth every 6 (six) hours as needed for pain.     Marland Kitchen amLODipine (NORVASC) 10 MG tablet Take 10 mg by mouth daily.     Marland Kitchen aspirin 81 MG chewable tablet Chew 81 mg by mouth daily.    Marland Kitchen atorvastatin (LIPITOR) 40 MG tablet Take 40 mg by mouth daily.    . benazepril (LOTENSIN) 40 MG tablet Take 40 mg by mouth daily.     . carvedilol (COREG) 25 MG tablet Take 1 tablet (25 mg total) by mouth 2 (two)  times daily.    . chlorthalidone (HYGROTON) 25 MG tablet Take 1 tablet (25 mg total) by mouth daily. 90 tablet 3  . clopidogrel (PLAVIX) 75 MG tablet TAKE ONE TABLET BY MOUTH ONCE DAILY 30 tablet 6  . Coenzyme Q10 200 MG capsule Take 200 mg by mouth daily.    [name] Sodium (COLACE PO) Take by mouth 2 (two) times daily.    Marland Kitchen glipiZIDE (GLUCOTROL) 10 MG tablet Take 10 mg by mouth daily.     Marland Kitchen HYDROcodone-acetaminophen (NORCO/VICODIN) 5-325 MG per tablet Take 1-2 tablets by mouth every 4 (four) hours as needed.     . metFORMIN (GLUMETZA) 500 MG (MOD) 24 hr tablet Take 500 mg by mouth daily with breakfast.    . Multiple Vitamin (MULTIVITAMIN) tablet Take 1 tablet by mouth daily.      . nortriptyline (PAMELOR) 50 MG capsule Take 50 mg by mouth at bedtime.    . Omega-3 Fatty Acids (FISH OIL TRIPLE STRENGTH) 1400 MG CAPS Take 1,400 mg by mouth every morning.    . potassium chloride SA (K-DUR,KLOR-CON) 20 MEQ tablet Take 1 tablet (20 mEq total) by mouth 2 (two) times daily. 180 tablet 3  . sildenafil (VIAGRA) 50 MG tablet Take 50 mg by mouth daily as needed for erectile dysfunction.    Marland Kitchen tiotropium (SPIRIVA) 18 MCG inhalation capsule Place 18 mcg into inhaler and inhale daily.      . traZODone (DESYREL) 100 MG tablet Take 100 mg by mouth at bedtime.      No current facility-administered medications on file prior to visit.     REVIEW OF SYSTEMS: Please see history of present illness, otherwise negative with the exception of shortness of breath with exertion and occasional dizziness  PHYSICAL EXAMINATION:   Vital signs are BP 120/59 mmHg  Pulse 56  Ht 6' (1.829 m)  Wt 204 lb (92.534 kg)  BMI 27.66 kg/m2  SpO2 100% General: The patient appears their stated age. HEENT:  No gross abnormalities Pulmonary:  Non labored breathing Abdomen: Soft and non-tender Musculoskeletal: There are no major deformities. Neurologic: No focal weakness or paresthesias are detected, Skin: There are no ulcer or  rashes noted. Psychiatric: The patient has normal affect. Cardiovascular: There is a regular rate and rhythm without significant murmur appreciated.   Diagnostic Studies I have reviewed his CT angiogram.  This shows a persistent type II endoleak, however the aneurysm sac has decreased from 5.3 cm down to  5.1 cm  Assessment: Status post endovascular aneurysm repair with type II endoleak Plan: By CT scan today, the aneurysm sac has gotten slightly smaller despite a persistent type II endoleak.  I have discussed this with the patient.  No intervention is recommended at this time.  I will have him follow-up again in 6 months with an ultrasound.  I discussed that if his aneurysm starts to increase in size that I would consider embolization.  Eldridge Abrahams, M.D. [facility] Office: [phone] Pager:  [phone]

## 2014-07-21 ENCOUNTER — Ambulatory Visit: Payer: Medicare Other | Admitting: Cardiology

## 2014-08-15 ENCOUNTER — Other Ambulatory Visit: Payer: Self-pay | Admitting: Cardiology

## 2014-08-18 ENCOUNTER — Telehealth: Payer: Self-pay | Admitting: *Deleted

## 2014-08-18 ENCOUNTER — Telehealth: Payer: Self-pay | Admitting: Cardiology

## 2014-08-18 NOTE — Telephone Encounter (Signed)
Received request from Nurse fax box, documents faxed for surgical clearance. To: [facility] Fax number: [phone] Attention: 3.3.16/km

## 2014-08-18 NOTE — Telephone Encounter (Signed)
Dr Aundra Dubin completed surgical clearance for Raliegh Ip left shoulder scope,rotator cuff repair--completed form returned to HIM. Pt advised form completed.

## 2014-08-31 ENCOUNTER — Encounter (HOSPITAL_COMMUNITY): Payer: Medicare Other

## 2014-09-05 ENCOUNTER — Ambulatory Visit (HOSPITAL_COMMUNITY): Payer: Medicare Other | Attending: Cardiology | Admitting: Cardiology

## 2014-09-05 DIAGNOSIS — Z951 Presence of aortocoronary bypass graft: Secondary | ICD-10-CM | POA: Diagnosis not present

## 2014-09-05 DIAGNOSIS — I10 Essential (primary) hypertension: Secondary | ICD-10-CM | POA: Insufficient documentation

## 2014-09-05 DIAGNOSIS — I2510 Atherosclerotic heart disease of native coronary artery without angina pectoris: Secondary | ICD-10-CM | POA: Insufficient documentation

## 2014-09-05 DIAGNOSIS — E119 Type 2 diabetes mellitus without complications: Secondary | ICD-10-CM | POA: Diagnosis not present

## 2014-09-05 DIAGNOSIS — R200 Anesthesia of skin: Secondary | ICD-10-CM

## 2014-09-05 DIAGNOSIS — I6523 Occlusion and stenosis of bilateral carotid arteries: Secondary | ICD-10-CM

## 2014-09-05 DIAGNOSIS — E785 Hyperlipidemia, unspecified: Secondary | ICD-10-CM | POA: Diagnosis not present

## 2014-09-05 DIAGNOSIS — M542 Cervicalgia: Secondary | ICD-10-CM

## 2014-09-05 DIAGNOSIS — M79602 Pain in left arm: Secondary | ICD-10-CM

## 2014-09-05 NOTE — Progress Notes (Signed)
Carotid duplex performed 

## 2014-09-06 ENCOUNTER — Ambulatory Visit (INDEPENDENT_AMBULATORY_CARE_PROVIDER_SITE_OTHER): Payer: Medicare Other | Admitting: Internal Medicine

## 2014-09-06 ENCOUNTER — Ambulatory Visit (INDEPENDENT_AMBULATORY_CARE_PROVIDER_SITE_OTHER)
Admission: RE | Admit: 2014-09-06 | Discharge: 2014-09-06 | Disposition: A | Discharge status: Home / Self Care | Payer: Medicare Other | Source: Ambulatory Visit | Attending: Internal Medicine | Admitting: Internal Medicine

## 2014-09-06 ENCOUNTER — Encounter: Payer: Self-pay | Admitting: Internal Medicine

## 2014-09-06 VITALS — BP 102/70 | HR 75 | Ht 72.0 in | Wt 194.0 lb

## 2014-09-06 DIAGNOSIS — R0602 Shortness of breath: Secondary | ICD-10-CM | POA: Diagnosis not present

## 2014-09-06 DIAGNOSIS — I6523 Occlusion and stenosis of bilateral carotid arteries: Secondary | ICD-10-CM | POA: Diagnosis not present

## 2014-09-06 NOTE — Progress Notes (Signed)
   Subjective:    Patient ID: Mitchell [name], male    DOB: 07-17-1945,    MRN: [id number]  HPI  72 yowm never smoker who has carried dx of "mild copd" x 2008 but pfts s sign airflow obstruction 09/06/2014 when referred to pulmonary clinic for surgical clearance for shoulder surgery by Dr French Ana.   09/06/2014 1st [facility] office visit/ Mitchell [name]  / maint on spiriva x 8 y Chief Complaint  Patient presents with  . Pulmonary Consult    Referred by Dr French Ana. Pt to have rotator cuff surgery and was told he needed pulmonary clearance. C/O SOB with excertion.   Doe x rapid walk > one block or inclines, no change x years, not sure spiriva helped much - dates back to post cabg and has not changed since. No need for saba/ lies flat ok s resp issues.  No obvious day to day or daytime variabilty or assoc cough or cp or chest tightness, subjective wheeze overt sinus or hb symptoms. No unusual exp hx or h/o childhood pna/ asthma or knowledge of premature birth.  Sleeping ok without nocturnal  or early am exacerbation  of respiratory  c/o's or need for noct saba. Also denies any obvious fluctuation of symptoms with weather or environmental changes or other aggravating or alleviating factors except as outlined above   Current Medications, Allergies, Complete Past Medical History, Past Surgical History, Family History, and Social History were reviewed in [employer] record.     Review of Systems  Constitutional: Negative for fever, chills, activity change, appetite change and unexpected weight change.  HENT: Negative for congestion, dental problem, postnasal drip, rhinorrhea, sneezing, sore throat, trouble swallowing and voice change.   Eyes: Negative for visual disturbance.  Respiratory: Positive for shortness of breath. Negative for cough and choking.   Cardiovascular: Negative for chest pain and leg swelling.  Gastrointestinal: Negative for nausea, vomiting and abdominal  pain.  Genitourinary: Negative for difficulty urinating.  Musculoskeletal: Negative for arthralgias.  Skin: Negative for rash.  Psychiatric/Behavioral: Negative for behavioral problems and confusion.       Objective:   Physical Exam   amb wm nad  Wt Readings from Last 3 Encounters:  09/06/14 194 lb (87.998 kg)  07/18/14 204 lb (92.534 kg)  06/01/14 197 lb (89.359 kg)    Vital signs reviewed    HEENT: nl dentition, turbinates, and orophanx. Nl external ear canals without cough reflex   NECK :  without JVD/Nodes/TM/ nl carotid upstrokes bilaterally   LUNGS: no acc muscle use, clear to A and P bilaterally without cough on insp or exp maneuvers   CV:  RRR  no s3 or murmur or increase in P2, no edema   ABD:  soft and nontender with nl excursion in the supine position. No bruits or organomegaly, bowel sounds nl  MS:  warm without deformities, calf tenderness, cyanosis or clubbing  SKIN: warm and dry without lesions    NEURO:  alert, approp, no deficits    CXR PA and Lateral:   09/06/2014 :     I personally reviewed images and agree with radiology impression as follows:    No acute cardiopulmonary disease. Prior CABG.       Assessment & Plan:

## 2014-09-06 NOTE — Assessment & Plan Note (Addendum)
Spirometry 09/06/2014 s sign airflow obstruction  though f/v loop non-physiologically truncated in effort dep portion (effort independent portion wnl)   He does not meet the criteria for copd and never smoker/ never had asthma with ? spiriva dependency but good activity tolerance, no tendency to acute exacerbations or noct symptoms but if he did we would need to seem him back to consider the ddx of airways disorders:  DDX of  difficult airways management all start with A and  include Adherence, Ace Inhibitors, Acid Reflux, Active Sinus Disease, Alpha 1 Antitripsin deficiency, Anxiety masquerading as Airways dz,  ABPA,  allergy(esp in young), Aspiration (esp in elderly), Adverse effects of DPI,  Active smokers, plus two Bs  = Bronchiectasis and Beta blocker use..and one C= CHF  Acei would be at the top of the list and would need 6 week trial off, it's the only way to tll  ? Acid (or non-acid) GERD > always difficult to exclude as up to 75% of pts in some series report no assoc GI/ Heartburn symptoms   ? BB effect > Strongly prefer in this setting: Bystolic, the most beta -1  selective Beta blocker available in sample form, with bisoprolol the most selective generic choice  on the market.   However, he's done so well for so long I don't see a problem with shoulder surgery and would simply keep the above ddx in mind should he have an post op resp issues.  We can see him back here at any point should he have cough or limiting sob over baseline

## 2014-09-06 NOTE — Patient Instructions (Addendum)
You are cleared for surgery   Please remember to go to the  x-ray department downstairs for your tests - we will call you with the results when they are available.

## 2014-09-07 NOTE — Progress Notes (Signed)
Quick Note:  Spoke with pt and notified of results per Dr. Wert. Pt verbalized understanding and denied any questions.  ______ 

## 2014-09-15 ENCOUNTER — Encounter (HOSPITAL_COMMUNITY): Payer: Self-pay | Admitting: Pharmacy Technician

## 2014-09-19 ENCOUNTER — Ambulatory Visit: Payer: Self-pay | Admitting: Physician Assistant

## 2014-09-20 ENCOUNTER — Ambulatory Visit: Payer: Self-pay | Admitting: Physician Assistant

## 2014-09-20 ENCOUNTER — Ambulatory Visit (HOSPITAL_BASED_OUTPATIENT_CLINIC_OR_DEPARTMENT_OTHER): Payer: Medicare Other | Admitting: Hematology and Oncology

## 2014-09-20 ENCOUNTER — Encounter (HOSPITAL_COMMUNITY)
Admission: RE | Admit: 2014-09-20 | Discharge: 2014-09-20 | Disposition: A | Discharge status: Home / Self Care | Payer: Medicare Other | Source: Ambulatory Visit | Attending: Orthopedic Surgery | Admitting: Orthopedic Surgery

## 2014-09-20 ENCOUNTER — Encounter (HOSPITAL_COMMUNITY): Payer: Self-pay

## 2014-09-20 ENCOUNTER — Other Ambulatory Visit (HOSPITAL_BASED_OUTPATIENT_CLINIC_OR_DEPARTMENT_OTHER): Payer: Medicare Other

## 2014-09-20 ENCOUNTER — Telehealth: Payer: Self-pay | Admitting: Hematology and Oncology

## 2014-09-20 ENCOUNTER — Encounter: Payer: Self-pay | Admitting: Hematology and Oncology

## 2014-09-20 ENCOUNTER — Other Ambulatory Visit: Payer: Self-pay | Admitting: Hematology and Oncology

## 2014-09-20 VITALS — BP 122/66 | HR 67 | Temp 98.1°F | Resp 18 | Ht 72.0 in | Wt 194.0 lb

## 2014-09-20 DIAGNOSIS — M19012 Primary osteoarthritis, left shoulder: Secondary | ICD-10-CM | POA: Diagnosis not present

## 2014-09-20 DIAGNOSIS — G8929 Other chronic pain: Secondary | ICD-10-CM | POA: Diagnosis not present

## 2014-09-20 DIAGNOSIS — M7542 Impingement syndrome of left shoulder: Secondary | ICD-10-CM | POA: Diagnosis not present

## 2014-09-20 DIAGNOSIS — I739 Peripheral vascular disease, unspecified: Secondary | ICD-10-CM | POA: Diagnosis not present

## 2014-09-20 DIAGNOSIS — E119 Type 2 diabetes mellitus without complications: Secondary | ICD-10-CM | POA: Diagnosis not present

## 2014-09-20 DIAGNOSIS — S46002A Unspecified injury of muscle(s) and tendon(s) of the rotator cuff of left shoulder, initial encounter: Secondary | ICD-10-CM | POA: Diagnosis not present

## 2014-09-20 DIAGNOSIS — C439 Malignant melanoma of skin, unspecified: Secondary | ICD-10-CM

## 2014-09-20 DIAGNOSIS — I2510 Atherosclerotic heart disease of native coronary artery without angina pectoris: Secondary | ICD-10-CM | POA: Diagnosis not present

## 2014-09-20 DIAGNOSIS — Z85820 Personal history of malignant melanoma of skin: Secondary | ICD-10-CM

## 2014-09-20 DIAGNOSIS — I10 Essential (primary) hypertension: Secondary | ICD-10-CM | POA: Diagnosis not present

## 2014-09-20 DIAGNOSIS — I509 Heart failure, unspecified: Secondary | ICD-10-CM | POA: Diagnosis not present

## 2014-09-20 DIAGNOSIS — F418 Other specified anxiety disorders: Secondary | ICD-10-CM | POA: Diagnosis not present

## 2014-09-20 DIAGNOSIS — Z951 Presence of aortocoronary bypass graft: Secondary | ICD-10-CM | POA: Diagnosis not present

## 2014-09-20 DIAGNOSIS — M75102 Unspecified rotator cuff tear or rupture of left shoulder, not specified as traumatic: Secondary | ICD-10-CM | POA: Diagnosis not present

## 2014-09-20 DIAGNOSIS — I252 Old myocardial infarction: Secondary | ICD-10-CM | POA: Diagnosis not present

## 2014-09-20 DIAGNOSIS — S46002D Unspecified injury of muscle(s) and tendon(s) of the rotator cuff of left shoulder, subsequent encounter: Secondary | ICD-10-CM

## 2014-09-20 LAB — CBC WITH DIFFERENTIAL/PLATELET
BASO%: 0.2 % (ref 0.0–2.0)
Basophils Absolute: 0 10*3/uL (ref 0.0–0.1)
EOS%: 2 % (ref 0.0–7.0)
Eosinophils Absolute: 0.1 10*3/uL (ref 0.0–0.5)
HCT: 40.6 % (ref 38.4–49.9)
HGB: 14.1 g/dL (ref 13.0–17.1)
LYMPH%: 31.1 % (ref 14.0–49.0)
MCH: 33.3 pg (ref 27.2–33.4)
MCHC: 34.7 g/dL (ref 32.0–36.0)
MCV: 96 fL (ref 79.3–98.0)
MONO#: 0.8 10*3/uL (ref 0.1–0.9)
MONO%: 12.7 % (ref 0.0–14.0)
NEUT#: 3.6 10*3/uL (ref 1.5–6.5)
NEUT%: 54 % (ref 39.0–75.0)
Platelets: 160 10*3/uL (ref 140–400)
RBC: 4.23 10*6/uL (ref 4.20–5.82)
RDW: 12.7 % (ref 11.0–14.6)
WBC: 6.6 10*3/uL (ref 4.0–10.3)
lymph#: 2.1 10*3/uL (ref 0.9–3.3)

## 2014-09-20 LAB — COMPREHENSIVE METABOLIC PANEL (CC13)
ALT: 19 U/L (ref 0–55)
AST: 20 U/L (ref 5–34)
Albumin: 3.8 g/dL (ref 3.5–5.0)
Alkaline Phosphatase: 49 U/L (ref 40–150)
Anion Gap: 10 mEq/L (ref 3–11)
BUN: 14.6 mg/dL (ref 7.0–26.0)
CO2: 25 meq/L (ref 22–29)
CREATININE: 1.1 mg/dL (ref 0.7–1.3)
Calcium: 8.9 mg/dL (ref 8.4–10.4)
Chloride: 106 mEq/L (ref 98–109)
EGFR: 69 mL/min/{1.73_m2} — AB (ref >90)
Glucose: 129 mg/dl (ref 70–140)
POTASSIUM: 4.2 meq/L (ref 3.5–5.1)
Sodium: 140 mEq/L (ref 136–145)
Total Bilirubin: 0.48 mg/dL (ref 0.20–1.20)
Total Protein: 7.7 g/dL (ref 6.4–8.3)

## 2014-09-20 LAB — LACTATE DEHYDROGENASE (CC13): LDH: 170 U/L (ref 125–245)

## 2014-09-20 NOTE — Progress Notes (Signed)
   09/20/14 1542  OBSTRUCTIVE SLEEP APNEA  Have you ever been diagnosed with sleep apnea through a sleep study? No  Do you snore loudly (loud enough to be heard through closed doors)?  0  Do you often feel tired, fatigued, or sleepy during the daytime? 0  Has anyone observed you stop breathing during your sleep? 0  Do you have, or are you being treated for high blood pressure? 1  BMI more than 35 kg/m2? 0  Age over 70 years old? 1  Neck circumference greater than 40 cm/16 inches? 1  Gender: 1

## 2014-09-20 NOTE — Pre-Procedure Instructions (Signed)
Mitchell [name]  09/20/2014   Your procedure is scheduled on:  Friday  09/23/14  Report to [facility] Admitting at 530 AM.  Call this number if you have problems the morning of surgery: [phone]   Remember:   Do not eat food or drink liquids after midnight.   Take these medicines the morning of surgery with A SIP OF WATER:  AMLODIPINE (NORVASC), CARVEDILOL(COREG), HYDROCODONE IF NEEDED, SPIRIVA   Do not wear jewelry, make-up or nail polish.  Do not wear lotions, powders, or perfumes. You may wear deodorant.  Do not shave 48 hours prior to surgery. Men may shave face and neck.  Do not bring valuables to the hospital.  [facility] is not responsible                  for any belongings or valuables.               Contacts, dentures or bridgework may not be worn into surgery.  Leave suitcase in the car. After surgery it may be brought to your room.  For patients admitted to the hospital, discharge time is determined by your                treatment team.               Patients discharged the day of surgery will not be allowed to drive  home.  Name and phone number of your driver:  Special Instructions: [facility] - Preparing for Surgery  Before surgery, you can play an important role.  Because skin is not sterile, your skin needs to be as free of germs as possible.  You can reduce the number of germs on you skin by washing with CHG (chlorahexidine gluconate) soap before surgery.  CHG is an antiseptic cleaner which kills germs and bonds with the skin to continue killing germs even after washing.  Please DO NOT use if you have an allergy to CHG or antibacterial soaps.  If your skin becomes reddened/irritated stop using the CHG and inform your nurse when you arrive at Short Stay.  Do not shave (including legs and underarms) for at least 48 hours prior to the first CHG shower.  You may shave your face.  Please follow these instructions carefully:   1.  Shower with CHG Soap the  night before surgery and the                                morning of Surgery.  2.  If you choose to wash your hair, wash your hair first as usual with your       normal shampoo.  3.  After you shampoo, rinse your hair and body thoroughly to remove the                      Shampoo.  4.  Use CHG as you would any other liquid soap.  You can apply chg directly       to the skin and wash gently with scrungie or a clean washcloth.  5.  Apply the CHG Soap to your body ONLY FROM THE NECK DOWN.        Do not use on open wounds or open sores.  Avoid contact with your eyes,       ears, mouth and genitals (private parts).  Wash genitals (private parts)  with your normal soap.  6.  Wash thoroughly, paying special attention to the area where your surgery        will be performed.  7.  Thoroughly rinse your body with warm water from the neck down.  8.  DO NOT shower/wash with your normal soap after using and rinsing off       the CHG Soap.  9.  Pat yourself dry with a clean towel.            10.  Wear clean pajamas.            11.  Place clean sheets on your bed the night of your first shower and do not        sleep with pets.  Day of Surgery  Do not apply any lotions/deoderants the morning of surgery.  Please wear clean clothes to the hospital/surgery center.     Please read over the following fact sheets that you were given: Pain Booklet, Coughing and Deep Breathing and Surgical Site Infection Prevention

## 2014-09-20 NOTE — Progress Notes (Signed)
[facility] OFFICE PROGRESS NOTE  Patient Care Team: Leonard Downing, MD as PCP - General (Family Medicine) Danella Sensing, MD (Dermatology) Larey Dresser, MD (Cardiology) Carolan Clines, MD as Attending Physician (Urology)  SUMMARY OF ONCOLOGIC HISTORY:  CHIEF COMPLAINTS/PURPOSE OF VISIT:    Primary site: Melanoma of the Skin   Staging method: AJCC 7th Edition   Clinical: Stage IB (T2a, N0, M0) signed by Heath Lark, MD on 09/17/2013  8:46 PM   Summary: Stage IB (T2a, N0, M0)  HISTORY OF PRESENTING ILLNESS:  Mitchell [name] 69 y.o. male was transferred to my care after his prior physician has left. His cancer was diagnosed after his PCP found an abnormal mole. The pathology indicated that it was Clark level IV, Breslow thickness 1.75 mm with mitotic figures 20/mm sq without ulceration, without lymphatic invasion, without perineural invasion, without microscopic satellitosis. He underwent wide local excision on 10/22/2010 followed by sentinel node biopsy without residual cancer or positive nodes.  He was observed.    The patient had excessive exposure of sun as a child. There is positive family history of skin cancer in his uncle but not melanoma. He is vigilant about skin protection and follows with his dermatologist regularly. He denies recent diagnosis of other skin cancer. Denies any new lymphadenopathy.  INTERVAL HISTORY: Please see below for problem oriented charting. He saw Dermatologist in January without Any Skin Lesions Removed. He Feels Well. He Is Waiting for Surgery to the Left Shoulder for Chronic Left Shoulder Pain.  REVIEW OF SYSTEMS:   Constitutional: Denies fevers, chills or abnormal weight loss Eyes: Denies blurriness of vision Ears, nose, mouth, throat, and face: Denies mucositis or sore throat Respiratory: Denies cough, dyspnea or wheezes Cardiovascular: Denies palpitation, chest discomfort or lower extremity swelling Gastrointestinal:  Denies  nausea, heartburn or change in bowel habits Skin: Denies abnormal skin rashes Lymphatics: Denies new lymphadenopathy or easy bruising Neurological:Denies numbness, tingling or new weaknesses Behavioral/Psych: Mood is stable, no new changes  All other systems were reviewed with the patient and are negative.  I have reviewed the past medical history, past surgical history, social history and family history with the patient and they are unchanged from previous note.  ALLERGIES:  is allergic to zocor.  MEDICATIONS:  Current Outpatient Prescriptions  Medication Sig Dispense Refill  . amLODipine (NORVASC) 10 MG tablet Take 10 mg by mouth daily.     Marland Kitchen aspirin 81 MG chewable tablet Chew 81 mg by mouth daily.    Marland Kitchen atorvastatin (LIPITOR) 40 MG tablet Take 40 mg by mouth daily.    . benazepril (LOTENSIN) 40 MG tablet Take 40 mg by mouth daily.     . carvedilol (COREG) 25 MG tablet Take 1 tablet (25 mg total) by mouth 2 (two) times daily.    . chlorthalidone (HYGROTON) 25 MG tablet Take 1 tablet (25 mg total) by mouth daily. 90 tablet 3  . cholecalciferol (VITAMIN D) 1000 UNITS tablet Take 1,000 Units by mouth daily.    . clopidogrel (PLAVIX) 75 MG tablet TAKE ONE TABLET BY MOUTH ONCE DAILY (Patient taking differently: Take 75mg  by mouth once daily) 30 tablet 3  . Coenzyme Q10 200 MG capsule Take 200 mg by mouth daily.    [name] Sodium (COLACE PO) Take 1 capsule by mouth 2 (two) times daily as needed (for constipation).     Marland Kitchen glipiZIDE (GLUCOTROL) 10 MG tablet Take 10 mg by mouth daily.     Marland Kitchen HYDROcodone-acetaminophen (NORCO/VICODIN)  5-325 MG per tablet Take 1-2 tablets by mouth every 4 (four) hours as needed for moderate pain.     . metFORMIN (GLUMETZA) 500 MG (MOD) 24 hr tablet Take 500 mg by mouth daily with breakfast.    . Multiple Vitamin (MULTIVITAMIN) tablet Take 1 tablet by mouth daily.      . nortriptyline (PAMELOR) 50 MG capsule Take 50 mg by mouth at bedtime.    . Omega-3 Fatty Acids  (FISH OIL TRIPLE STRENGTH) 1400 MG CAPS Take 1,400 mg by mouth every morning.    . potassium chloride SA (K-DUR,KLOR-CON) 20 MEQ tablet Take 1 tablet (20 mEq total) by mouth 2 (two) times daily. 180 tablet 3  . sildenafil (VIAGRA) 50 MG tablet Take 50 mg by mouth daily as needed for erectile dysfunction.    Marland Kitchen tiotropium (SPIRIVA) 18 MCG inhalation capsule Place 18 mcg into inhaler and inhale daily.      . traZODone (DESYREL) 100 MG tablet Take 100 mg by mouth at bedtime.      No current facility-administered medications for this visit.    PHYSICAL EXAMINATION: ECOG PERFORMANCE STATUS: 0 - Asymptomatic  Filed Vitals:   09/20/14 1218  BP: 122/66  Pulse: 67  Temp: 98.1 F (36.7 C)  Resp: 18   Filed Weights   09/20/14 1218  Weight: 194 lb (87.998 kg)    GENERAL:alert, no distress and comfortable SKIN: skin color, texture, turgor are normal, no rashes or significant lesions EYES: normal, Conjunctiva are pink and non-injected, sclera clear OROPHARYNX:no exudate, no erythema and lips, buccal mucosa, and tongue normal  NECK: supple, thyroid normal size, non-tender, without nodularity LYMPH:  no palpable lymphadenopathy in the cervical, axillary or inguinal LUNGS: clear to auscultation and percussion with normal breathing effort HEART: regular rate & rhythm and no murmurs and no lower extremity edema ABDOMEN:abdomen soft, non-tender and normal bowel sounds Musculoskeletal:no cyanosis of digits and no clubbing  NEURO: alert & oriented x 3 with fluent speech, no focal motor/sensory deficits  LABORATORY DATA:  I have reviewed the data as listed    Component Value Date/Time   NA 140 09/20/2014 1148   NA 139 08/24/2013 0752   K 4.2 09/20/2014 1148   K 3.5 08/24/2013 0752   CL 104 08/24/2013 0752   CL 101 09/18/2012 1428   CO2 25 09/20/2014 1148   CO2 28 08/24/2013 0752   GLUCOSE 129 09/20/2014 1148   GLUCOSE 171* 08/24/2013 0752   GLUCOSE 105* 09/18/2012 1428   BUN 14.6  09/20/2014 1148   BUN 21 07/12/2014 1508   CREATININE 1.1 09/20/2014 1148   CREATININE 1.19 07/12/2014 1508   CREATININE 1.2 08/24/2013 0752   CALCIUM 8.9 09/20/2014 1148   CALCIUM 8.8 08/24/2013 0752   PROT 7.7 09/20/2014 1148   PROT 8.2 12/09/2012 1255   ALBUMIN 3.8 09/20/2014 1148   ALBUMIN 3.8 12/09/2012 1255   AST 20 09/20/2014 1148   AST 21 12/09/2012 1255   ALT 19 09/20/2014 1148   ALT 20 12/09/2012 1255   ALKPHOS 49 09/20/2014 1148   ALKPHOS 55 12/09/2012 1255   BILITOT 0.48 09/20/2014 1148   BILITOT 0.3 12/09/2012 1255   GFRNONAA 75* 12/12/2012 0528   GFRAA 87* 12/12/2012 0528    No results found for: SPEP, UPEP  Lab Results  Component Value Date   WBC 6.6 09/20/2014   NEUTROABS 3.6 09/20/2014   HGB 14.1 09/20/2014   HCT 40.6 09/20/2014   MCV 96.0 09/20/2014   PLT 160 09/20/2014  Chemistry      Component Value Date/Time   NA 140 09/20/2014 1148   NA 139 08/24/2013 0752   K 4.2 09/20/2014 1148   K 3.5 08/24/2013 0752   CL 104 08/24/2013 0752   CL 101 09/18/2012 1428   CO2 25 09/20/2014 1148   CO2 28 08/24/2013 0752   BUN 14.6 09/20/2014 1148   BUN 21 07/12/2014 1508   CREATININE 1.1 09/20/2014 1148   CREATININE 1.19 07/12/2014 1508   CREATININE 1.2 08/24/2013 0752      Component Value Date/Time   CALCIUM 8.9 09/20/2014 1148   CALCIUM 8.8 08/24/2013 0752   ALKPHOS 49 09/20/2014 1148   ALKPHOS 55 12/09/2012 1255   AST 20 09/20/2014 1148   AST 21 12/09/2012 1255   ALT 19 09/20/2014 1148   ALT 20 12/09/2012 1255   BILITOT 0.48 09/20/2014 1148   BILITOT 0.3 12/09/2012 1255     ASSESSMENT & PLAN:  Melanoma Clinically, he has no signs of disease recurrence. He had CT imaging study done recently last month which showed no evidence of metastatic disease. I will see him again next year with history, blood work and examination. After 5 years, I will discharge him from the clinic. I recommend he continues close follow-up with dermatologist. The  patient is very vigilant about skin protection.   Injury of tendon of left rotator cuff He has chronic pain from rotator cuff injury on the left shoulder and is scheduled for surgery and of the week. I will defer to his orthopedic surgeon for management.    Orders Placed This Encounter  Procedures  . CBC with Differential/Platelet    Standing Status: Future     Number of Occurrences:      Standing Expiration Date: 10/25/2015  . Comprehensive metabolic panel    Standing Status: Future     Number of Occurrences:      Standing Expiration Date: 10/25/2015  . Lactate dehydrogenase    Standing Status: Future     Number of Occurrences:      Standing Expiration Date: 10/25/2015   All questions were answered. The patient knows to call the clinic with any problems, questions or concerns. No barriers to learning was detected. I spent 15 minutes counseling the patient face to face. The total time spent in the appointment was 20 minutes and more than 50% was on counseling and review of test results     [facility], [place], MD 09/20/2014 12:53 PM

## 2014-09-20 NOTE — Progress Notes (Signed)
PATIENT STATED HE HAS ALREADY STOPPED ASPIRIN, PLAVIX, FISH OIL, VITAMINS.

## 2014-09-20 NOTE — Assessment & Plan Note (Signed)
He has chronic pain from rotator cuff injury on the left shoulder and is scheduled for surgery and of the week. I will defer to his orthopedic surgeon for management.

## 2014-09-20 NOTE — Telephone Encounter (Signed)
One year appointment made,letter and calendar have been mailed to the patient

## 2014-09-20 NOTE — H&P (Signed)
Mitchell [name] is an 69 y.o. male.   Chief Complaint: left rotator cuff tear  HPI: Chief complaint:  Left rotator cuff tear. He saw Dr. Micheline Chapman years ago after a fall in 2008, at that time was recommended to have surgery, his impression was he did not have a reparable lesion and there's an obvious discrepancy there. He's here with severe pain. He had a cardiac workup and neurosurgery workup and tells me he does not have significant disc problems in his neck. Past medical history is significant for diabetes mellitus hypertension and coronary artery disease with history of heart attack. Family history significant for hypertension and coronary artery disease. He's a nonsmoker and retired. Medications: Norvasc, aspirin, Atorvastatin, Lotensin, Hygroton, Plavis, Coreg, Glucotrol, takes 5 Hydrocodone for chronic back pain through Dr. Arelia Sneddon also takes Metformin, multivitamins, Nortriptyline, potassium chloride Advil Spiriva and Desyrel. We repeated MRI showing that his tear has gotten worse and it's retracted 1.3 from front to back, medial to the humeral head but not to the level of the glenoid making it probably a 75% chance of repairing it. He has chronic pain.   Past Medical History  Diagnosis Date  . CAD (coronary artery disease)     patient had non-ST-elevation MI in February 2008 and there was an 80-90% proximal LAD stenosis, 90% proximal first diagonal stenosis, 99% ostial ramus stenosis. The first obtuse marginal was subtotally occluded and there was an 80% mid RCA stenosis. EF was estimated 25% on ventriculogram, coronary artery bypass grafting was done with a LIMA to the LAD, sequential saphenous vein graft to the PDA,  . Aortic insufficiency   . AAA (abdominal aortic aneurysm)     ultrasound done in 3/11 and showed AAA 3.9 x 3.9cm  . HTN (hypertension)   . DM2 (diabetes mellitus, type 2)   . Carotid stenosis   . Torn rotator cuff     hx of. left   . History of PFTs    obstructive/restrictive. There is some reversibility with bronchodilator. The patient actually never smoked. He is on Spiriva  . Cancer     melanoma  . Altered taste   . Pain     back  . COPD (chronic obstructive pulmonary disease)   . Melanoma 09/2010    pT2a N0 M0; clark level IV.   Marland Kitchen Range of motion deficit 09/19/2011  . Myocardial infarction   . Diabetes mellitus   . Irregular heart beat   . Anxiety   . Depression   . Shortness of breath     with exeration  . CHF (congestive heart failure)     Past Surgical History  Procedure Laterality Date  . Tonsillectomy  1951  . Arterial bypass surgry    . Melanoma excision    . Blood clots removed    . Application of wound vac    . Coronary artery bypass graft  07/2006  . Tonsillectomy    . Abdominal aortic endovascular stent graft N/A 12/11/2012    Procedure: ABDOMINAL AORTIC ENDOVASCULAR STENT GRAFT GORE;  Surgeon: Serafina Mitchell, MD;  Location: [facility] OR;  Service: Vascular;  Laterality: N/A;  . Abdominal aortic aneurysm repair      Family History  Problem Relation Age of Onset  . Hyperlipidemia Mother   . Hypertension Mother   . Heart disease Mother   . Hypertension Father   . AAA (abdominal aortic aneurysm) Father   . Heart disease Father   . Hyperlipidemia Father   . Throat cancer  Maternal Uncle    Social History:  reports that he has never smoked. He has never used smokeless tobacco. He reports that he does not drink alcohol or use illicit drugs.  Allergies:  Allergies  Allergen Reactions  . Zocor [Simvastatin] Other (See Comments)    Bone Pain     (Not in a hospital admission)  Results for orders placed or performed in visit on 09/20/14 (from the past 48 hour(s))  CBC with Differential/Platelet     Status: None   Collection Time: 09/20/14 11:48 AM  Result Value Ref Range   WBC 6.6 4.0 - 10.3 10e3/uL   NEUT# 3.6 1.5 - 6.5 10e3/uL   HGB 14.1 13.0 - 17.1 g/dL   HCT 40.6 38.4 - 49.9 %   Platelets 160 140 - 400  10e3/uL   MCV 96.0 79.3 - 98.0 fL   MCH 33.3 27.2 - 33.4 pg   MCHC 34.7 32.0 - 36.0 g/dL   RBC 4.23 4.20 - 5.82 10e6/uL   RDW 12.7 11.0 - 14.6 %   lymph# 2.1 0.9 - 3.3 10e3/uL   MONO# 0.8 0.1 - 0.9 10e3/uL   Eosinophils Absolute 0.1 0.0 - 0.5 10e3/uL   Basophils Absolute 0.0 0.0 - 0.1 10e3/uL   NEUT% 54.0 39.0 - 75.0 %   LYMPH% 31.1 14.0 - 49.0 %   MONO% 12.7 0.0 - 14.0 %   EOS% 2.0 0.0 - 7.0 %   BASO% 0.2 0.0 - 2.0 %  Comprehensive metabolic panel     Status: Abnormal   Collection Time: 09/20/14 11:48 AM  Result Value Ref Range   Sodium 140 136 - 145 mEq/L   Potassium 4.2 3.5 - 5.1 mEq/L   Chloride 106 98 - 109 mEq/L   CO2 25 22 - 29 mEq/L   Glucose 129 70 - 140 mg/dl   BUN 14.6 7.0 - 26.0 mg/dL   Creatinine 1.1 0.7 - 1.3 mg/dL   Total Bilirubin 0.48 0.20 - 1.20 mg/dL   Alkaline Phosphatase 49 40 - 150 U/L   AST 20 5 - 34 U/L   ALT 19 0 - 55 U/L   Total Protein 7.7 6.4 - 8.3 g/dL   Albumin 3.8 3.5 - 5.0 g/dL   Calcium 8.9 8.4 - 10.4 mg/dL   Anion Gap 10 3 - 11 mEq/L   EGFR 69 (L) >90 ml/min/1.73 m2    Comment: eGFR is calculated using the CKD-EPI Creatinine Equation (2009)  Lactate dehydrogenase     Status: None   Collection Time: 09/20/14 11:48 AM  Result Value Ref Range   LDH 170 125 - 245 U/L   No results found.  Review of Systems  Constitutional: Negative.   HENT: Negative.   Eyes: Negative.   Respiratory: Positive for shortness of breath.   Cardiovascular: Negative.   Gastrointestinal: Negative.   Genitourinary: Negative.   Musculoskeletal: Positive for myalgias and joint pain.  Skin: Negative.   Neurological: Negative.   Endo/Heme/Allergies: Bruises/bleeds easily.  Psychiatric/Behavioral: Positive for depression.    There were no vitals taken for this visit. Physical Exam  Constitutional: He is oriented to person, place, and time. He appears well-developed and well-nourished. No distress.  HENT:  Head: Normocephalic and atraumatic.  Nose: Nose  normal.  Eyes: Conjunctivae and EOM are normal. Pupils are equal, round, and reactive to light.  Neck: Normal range of motion. Neck supple.  Cardiovascular: Normal rate and intact distal pulses.   Respiratory: Accessory muscle usage present. Tachypnea noted.  GI: Soft.  He exhibits no distension. There is no tenderness.  Musculoskeletal:       Left shoulder: He exhibits decreased range of motion, tenderness, bony tenderness, pain and decreased strength.  Neurological: He is alert and oriented to person, place, and time.  Skin: Skin is warm and dry. No rash noted. No erythema.  Psychiatric: He has a normal mood and affect. His behavior is normal.     Assessment/Plan Left shoulder rotator cuff tear  He has a well-defined shoulder problem and would be candidate for debridement open rotator cuff repair acromioplasty and distal clavicle excision. Discussed risks benefits and possible complications in detail. He's on Plavix will need to be off that. He recently passed a stress test and we obtained surgical clearance from pulmonology and cardiology.  He's on chronic Hydrocodone for back pain and we'll upgrade him to Oxycodone after surgery. We'll do this as an overnight due to the fact he lives alone and he will follow-up with me after surgery  Mitchell [name], Mitchell [name] 09/20/2014, 1:06 PM

## 2014-09-20 NOTE — Assessment & Plan Note (Signed)
Clinically, he has no signs of disease recurrence. He had CT imaging study done recently last month which showed no evidence of metastatic disease. I will see him again next year with history, blood work and examination. After 5 years, I will discharge him from the clinic. I recommend he continues close follow-up with dermatologist. The patient is very vigilant about skin protection.

## 2014-09-22 MED ORDER — CHLORHEXIDINE GLUCONATE 4 % EX LIQD
60.0000 mL | Freq: Once | CUTANEOUS | Status: DC
  Filled 2014-09-22: qty 60

## 2014-09-22 MED ORDER — SODIUM CHLORIDE 0.9 % IV SOLN: INTRAVENOUS | Status: DC

## 2014-09-22 MED ORDER — CEFAZOLIN SODIUM-DEXTROSE 2-3 GM-% IV SOLR
2.0000 g | INTRAVENOUS | Status: AC
  Administered 2014-09-23: 2 g via INTRAVENOUS
  Filled 2014-09-22: qty 50

## 2014-09-23 ENCOUNTER — Ambulatory Visit (HOSPITAL_COMMUNITY)
Admission: RE | Admit: 2014-09-23 | Discharge: 2014-09-24 | Disposition: A | Discharge status: Home / Self Care | Payer: Medicare Other | Source: Ambulatory Visit | Attending: Orthopedic Surgery | Admitting: Orthopedic Surgery

## 2014-09-23 ENCOUNTER — Encounter (HOSPITAL_COMMUNITY): Payer: Self-pay | Admitting: Anesthesiology

## 2014-09-23 ENCOUNTER — Encounter (HOSPITAL_COMMUNITY)
Admission: RE | Disposition: A | Discharge status: Home / Self Care | Payer: Self-pay | Source: Ambulatory Visit | Attending: Orthopedic Surgery

## 2014-09-23 ENCOUNTER — Ambulatory Visit (HOSPITAL_COMMUNITY): Payer: Medicare Other | Admitting: Anesthesiology

## 2014-09-23 DIAGNOSIS — I739 Peripheral vascular disease, unspecified: Secondary | ICD-10-CM | POA: Insufficient documentation

## 2014-09-23 DIAGNOSIS — M19012 Primary osteoarthritis, left shoulder: Secondary | ICD-10-CM | POA: Diagnosis not present

## 2014-09-23 DIAGNOSIS — M7542 Impingement syndrome of left shoulder: Secondary | ICD-10-CM | POA: Insufficient documentation

## 2014-09-23 DIAGNOSIS — I252 Old myocardial infarction: Secondary | ICD-10-CM | POA: Insufficient documentation

## 2014-09-23 DIAGNOSIS — I10 Essential (primary) hypertension: Secondary | ICD-10-CM | POA: Diagnosis not present

## 2014-09-23 DIAGNOSIS — I509 Heart failure, unspecified: Secondary | ICD-10-CM | POA: Insufficient documentation

## 2014-09-23 DIAGNOSIS — I2510 Atherosclerotic heart disease of native coronary artery without angina pectoris: Secondary | ICD-10-CM | POA: Insufficient documentation

## 2014-09-23 DIAGNOSIS — Z951 Presence of aortocoronary bypass graft: Secondary | ICD-10-CM | POA: Insufficient documentation

## 2014-09-23 DIAGNOSIS — M75102 Unspecified rotator cuff tear or rupture of left shoulder, not specified as traumatic: Secondary | ICD-10-CM | POA: Diagnosis not present

## 2014-09-23 DIAGNOSIS — F418 Other specified anxiety disorders: Secondary | ICD-10-CM | POA: Insufficient documentation

## 2014-09-23 DIAGNOSIS — E119 Type 2 diabetes mellitus without complications: Secondary | ICD-10-CM | POA: Insufficient documentation

## 2014-09-23 HISTORY — PX: SHOULDER ARTHROSCOPY WITH ROTATOR CUFF REPAIR AND SUBACROMIAL DECOMPRESSION: SHX5686

## 2014-09-23 LAB — GLUCOSE, CAPILLARY
GLUCOSE-CAPILLARY: 133 mg/dL — AB (ref 70–99)
Glucose-Capillary: 151 mg/dL — ABNORMAL HIGH (ref 70–99)
Glucose-Capillary: 123 mg/dL — ABNORMAL HIGH (ref 70–99)
Glucose-Capillary: 223 mg/dL — ABNORMAL HIGH (ref 70–99)
Glucose-Capillary: 129 mg/dL — ABNORMAL HIGH (ref 70–99)

## 2014-09-23 SURGERY — SHOULDER ARTHROSCOPY WITH ROTATOR CUFF REPAIR AND SUBACROMIAL DECOMPRESSION
Anesthesia: Regional | Site: Shoulder | Laterality: Left

## 2014-09-23 MED ORDER — ARTIFICIAL TEARS OP OINT
TOPICAL_OINTMENT | OPHTHALMIC | Status: DC | PRN
  Administered 2014-09-23: 1 via OPHTHALMIC

## 2014-09-23 MED ORDER — ROPIVACAINE HCL 5 MG/ML IJ SOLN
INTRAMUSCULAR | Status: DC | PRN
  Administered 2014-09-23: 25 mL via PERINEURAL

## 2014-09-23 MED ORDER — LIDOCAINE HCL (CARDIAC) 20 MG/ML IV SOLN
INTRAVENOUS | Status: DC | PRN
  Administered 2014-09-23: 60 mg via INTRAVENOUS

## 2014-09-23 MED ORDER — BENAZEPRIL HCL 40 MG PO TABS
40.0000 mg | ORAL_TABLET | Freq: Every day | ORAL | Status: DC
  Administered 2014-09-23 – 2014-09-24 (×2): 40 mg via ORAL
  Filled 2014-09-23 (×2): qty 1

## 2014-09-23 MED ORDER — INSULIN ASPART 100 UNIT/ML ~~LOC~~ SOLN: 0.0000 [IU] | Freq: Every day | SUBCUTANEOUS | Status: DC

## 2014-09-23 MED ORDER — ONDANSETRON HCL 4 MG/2ML IJ SOLN: 4.0000 mg | Freq: Four times a day (QID) | INTRAMUSCULAR | Status: DC | PRN

## 2014-09-23 MED ORDER — PHENYLEPHRINE HCL 10 MG/ML IJ SOLN
10.0000 mg | INTRAMUSCULAR | Status: DC | PRN
  Administered 2014-09-23: 20 ug/min via INTRAVENOUS

## 2014-09-23 MED ORDER — ONDANSETRON HCL 4 MG PO TABS: 4.0000 mg | ORAL_TABLET | Freq: Four times a day (QID) | ORAL | Status: DC | PRN

## 2014-09-23 MED ORDER — ALBUTEROL SULFATE HFA 108 (90 BASE) MCG/ACT IN AERS
INHALATION_SPRAY | RESPIRATORY_TRACT | Status: DC | PRN
  Administered 2014-09-23: 2 via RESPIRATORY_TRACT

## 2014-09-23 MED ORDER — OXYCODONE-ACETAMINOPHEN 5-325 MG PO TABS: 1.0000 | ORAL_TABLET | ORAL | Status: DC | PRN

## 2014-09-23 MED ORDER — ADULT MULTIVITAMIN W/MINERALS CH
1.0000 | ORAL_TABLET | Freq: Every day | ORAL | Status: DC
  Administered 2014-09-23 – 2014-09-24 (×2): 1 via ORAL
  Filled 2014-09-23 (×3): qty 1

## 2014-09-23 MED ORDER — EPHEDRINE SULFATE 50 MG/ML IJ SOLN
INTRAMUSCULAR | Status: DC | PRN
  Administered 2014-09-23: 10 mg via INTRAVENOUS
  Administered 2014-09-23: 5 mg via INTRAVENOUS
  Administered 2014-09-23: 10 mg via INTRAVENOUS

## 2014-09-23 MED ORDER — FLEET ENEMA 7-19 GM/118ML RE ENEM: 1.0000 | ENEMA | Freq: Once | RECTAL | Status: AC | PRN

## 2014-09-23 MED ORDER — MIDAZOLAM HCL 5 MG/5ML IJ SOLN
INTRAMUSCULAR | Status: DC | PRN
  Administered 2014-09-23: 2 mg via INTRAVENOUS

## 2014-09-23 MED ORDER — SUCCINYLCHOLINE CHLORIDE 20 MG/ML IJ SOLN
INTRAMUSCULAR | Status: DC | PRN
  Administered 2014-09-23: 110 mg via INTRAVENOUS

## 2014-09-23 MED ORDER — HYDROMORPHONE HCL 1 MG/ML IJ SOLN
0.5000 mg | INTRAMUSCULAR | Status: DC | PRN
  Administered 2014-09-23 – 2014-09-24 (×4): 1 mg via INTRAVENOUS
  Filled 2014-09-23 (×5): qty 1

## 2014-09-23 MED ORDER — DOCUSATE SODIUM 100 MG PO CAPS: 100.0000 mg | ORAL_CAPSULE | Freq: Two times a day (BID) | ORAL | Status: DC | PRN

## 2014-09-23 MED ORDER — CEFAZOLIN SODIUM 1-5 GM-% IV SOLN
1.0000 g | Freq: Four times a day (QID) | INTRAVENOUS | Status: AC
  Administered 2014-09-23 – 2014-09-24 (×3): 1 g via INTRAVENOUS
  Filled 2014-09-23 (×3): qty 50

## 2014-09-23 MED ORDER — OXYCODONE HCL 5 MG PO TABS: 5.0000 mg | ORAL_TABLET | Freq: Once | ORAL | Status: DC | PRN

## 2014-09-23 MED ORDER — TRAZODONE HCL 100 MG PO TABS
100.0000 mg | ORAL_TABLET | Freq: Every day | ORAL | Status: DC
  Administered 2014-09-23: 100 mg via ORAL
  Filled 2014-09-23: qty 1

## 2014-09-23 MED ORDER — NORTRIPTYLINE HCL 25 MG PO CAPS
50.0000 mg | ORAL_CAPSULE | Freq: Every day | ORAL | Status: DC
  Administered 2014-09-23: 50 mg via ORAL
  Filled 2014-09-23 (×2): qty 2

## 2014-09-23 MED ORDER — METOCLOPRAMIDE HCL 5 MG/ML IJ SOLN: 5.0000 mg | Freq: Three times a day (TID) | INTRAMUSCULAR | Status: DC | PRN

## 2014-09-23 MED ORDER — CARVEDILOL 25 MG PO TABS
25.0000 mg | ORAL_TABLET | Freq: Two times a day (BID) | ORAL | Status: DC
  Administered 2014-09-23 – 2014-09-24 (×2): 25 mg via ORAL
  Filled 2014-09-23 (×2): qty 1

## 2014-09-23 MED ORDER — ATORVASTATIN CALCIUM 40 MG PO TABS
40.0000 mg | ORAL_TABLET | Freq: Every day | ORAL | Status: DC
  Administered 2014-09-23: 40 mg via ORAL
  Filled 2014-09-23: qty 1

## 2014-09-23 MED ORDER — PROPOFOL 10 MG/ML IV BOLUS
INTRAVENOUS | Status: AC
  Filled 2014-09-23: qty 20

## 2014-09-23 MED ORDER — HYDROMORPHONE HCL 1 MG/ML IJ SOLN: 0.2500 mg | INTRAMUSCULAR | Status: DC | PRN

## 2014-09-23 MED ORDER — LIDOCAINE HCL 4 % MT SOLN
OROMUCOSAL | Status: DC | PRN
  Administered 2014-09-23: 4 mL via TOPICAL

## 2014-09-23 MED ORDER — SENNOSIDES-DOCUSATE SODIUM 8.6-50 MG PO TABS: 1.0000 | ORAL_TABLET | Freq: Every evening | ORAL | Status: DC | PRN

## 2014-09-23 MED ORDER — INSULIN ASPART 100 UNIT/ML ~~LOC~~ SOLN
4.0000 [IU] | Freq: Three times a day (TID) | SUBCUTANEOUS | Status: DC
  Administered 2014-09-23 – 2014-09-24 (×3): 4 [IU] via SUBCUTANEOUS

## 2014-09-23 MED ORDER — VITAMIN D 1000 UNITS PO TABS
1000.0000 [IU] | ORAL_TABLET | Freq: Every day | ORAL | Status: DC
  Administered 2014-09-23 – 2014-09-24 (×2): 1000 [IU] via ORAL
  Filled 2014-09-23 (×2): qty 1

## 2014-09-23 MED ORDER — OXYCODONE-ACETAMINOPHEN 5-325 MG PO TABS
1.0000 | ORAL_TABLET | ORAL | Status: DC | PRN
  Administered 2014-09-23 – 2014-09-24 (×5): 2 via ORAL
  Filled 2014-09-23 (×5): qty 2

## 2014-09-23 MED ORDER — AMLODIPINE BESYLATE 10 MG PO TABS
10.0000 mg | ORAL_TABLET | Freq: Every day | ORAL | Status: DC
  Administered 2014-09-24: 10 mg via ORAL
  Filled 2014-09-23: qty 1

## 2014-09-23 MED ORDER — SODIUM CHLORIDE 0.9 % IV SOLN
INTRAVENOUS | Status: DC
  Administered 2014-09-23 – 2014-09-24 (×2): via INTRAVENOUS

## 2014-09-23 MED ORDER — COENZYME Q10 200 MG PO CAPS: 200.0000 mg | ORAL_CAPSULE | Freq: Every day | ORAL | Status: DC

## 2014-09-23 MED ORDER — ASPIRIN 81 MG PO CHEW
81.0000 mg | CHEWABLE_TABLET | Freq: Every day | ORAL | Status: DC
  Administered 2014-09-23 – 2014-09-24 (×2): 81 mg via ORAL
  Filled 2014-09-23 (×2): qty 1

## 2014-09-23 MED ORDER — BUPIVACAINE-EPINEPHRINE (PF) 0.5% -1:200000 IJ SOLN
INTRAMUSCULAR | Status: AC
  Filled 2014-09-23: qty 30

## 2014-09-23 MED ORDER — CHLORTHALIDONE 25 MG PO TABS
25.0000 mg | ORAL_TABLET | Freq: Every day | ORAL | Status: DC
  Administered 2014-09-23 – 2014-09-24 (×2): 25 mg via ORAL
  Filled 2014-09-23 (×2): qty 1

## 2014-09-23 MED ORDER — METOCLOPRAMIDE HCL 5 MG PO TABS
5.0000 mg | ORAL_TABLET | Freq: Three times a day (TID) | ORAL | Status: DC | PRN
Start: 2014-09-23 — End: 2014-09-24

## 2014-09-23 MED ORDER — POTASSIUM CHLORIDE CRYS ER 20 MEQ PO TBCR
20.0000 meq | EXTENDED_RELEASE_TABLET | Freq: Two times a day (BID) | ORAL | Status: DC
  Administered 2014-09-23 – 2014-09-24 (×3): 20 meq via ORAL
  Filled 2014-09-23 (×3): qty 1

## 2014-09-23 MED ORDER — TIOTROPIUM BROMIDE MONOHYDRATE 18 MCG IN CAPS
18.0000 ug | ORAL_CAPSULE | Freq: Every day | RESPIRATORY_TRACT | Status: DC
  Administered 2014-09-24: 18 ug via RESPIRATORY_TRACT
  Filled 2014-09-23: qty 5

## 2014-09-23 MED ORDER — FENTANYL CITRATE 0.05 MG/ML IJ SOLN
INTRAMUSCULAR | Status: DC | PRN
  Administered 2014-09-23: 50 ug via INTRAVENOUS
  Administered 2014-09-23: 25 ug via INTRAVENOUS
  Administered 2014-09-23: 50 ug via INTRAVENOUS

## 2014-09-23 MED ORDER — PROMETHAZINE HCL 25 MG/ML IJ SOLN: 6.2500 mg | INTRAMUSCULAR | Status: DC | PRN

## 2014-09-23 MED ORDER — LACTATED RINGERS IV SOLN
INTRAVENOUS | Status: DC | PRN
  Administered 2014-09-23: 07:00:00 via INTRAVENOUS

## 2014-09-23 MED ORDER — INSULIN ASPART 100 UNIT/ML ~~LOC~~ SOLN
0.0000 [IU] | Freq: Three times a day (TID) | SUBCUTANEOUS | Status: DC
  Administered 2014-09-23: 5 [IU] via SUBCUTANEOUS
  Administered 2014-09-23: 2 [IU] via SUBCUTANEOUS
  Administered 2014-09-24: 5 [IU] via SUBCUTANEOUS

## 2014-09-23 MED ORDER — MIDAZOLAM HCL 2 MG/2ML IJ SOLN
INTRAMUSCULAR | Status: AC
  Filled 2014-09-23: qty 2

## 2014-09-23 MED ORDER — ONDANSETRON HCL 4 MG/2ML IJ SOLN
INTRAMUSCULAR | Status: DC | PRN
  Administered 2014-09-23: 4 mg via INTRAVENOUS

## 2014-09-23 MED ORDER — OXYCODONE HCL 5 MG/5ML PO SOLN: 5.0000 mg | Freq: Once | ORAL | Status: DC | PRN

## 2014-09-23 MED ORDER — BISACODYL 10 MG RE SUPP: 10.0000 mg | Freq: Every day | RECTAL | Status: DC | PRN

## 2014-09-23 MED ORDER — FENTANYL CITRATE 0.05 MG/ML IJ SOLN
INTRAMUSCULAR | Status: AC
  Filled 2014-09-23: qty 5

## 2014-09-23 MED ORDER — PROPOFOL 10 MG/ML IV BOLUS
INTRAVENOUS | Status: DC | PRN
  Administered 2014-09-23: 90 mg via INTRAVENOUS
  Administered 2014-09-23: 20 mg via INTRAVENOUS

## 2014-09-23 SURGICAL SUPPLY — 67 items
ANCH SUT 2 FT CRKSCW 14.7 STRL (Anchor) ×1 IMPLANT
ANCHOR CORKSCREW BIO 5.5 FT (Anchor) ×2 IMPLANT
APL SKNCLS STERI-STRIP NONHPOA (GAUZE/BANDAGES/DRESSINGS) ×1
BENZOIN TINCTURE PRP APPL 2/3 (GAUZE/BANDAGES/DRESSINGS) ×2 IMPLANT
BLADE AVERAGE 25MMX9MM (BLADE) ×1
BLADE AVERAGE 25X9 (BLADE) ×1 IMPLANT
BLADE CUDA 5.5 (BLADE) IMPLANT
BLADE GREAT WHITE 4.2 (BLADE) ×2 IMPLANT
BLADE GREAT WHITE 4.2MM (BLADE) ×1
BLADE SURG 11 STRL SS (BLADE) ×3 IMPLANT
BUR OVAL 6.0 (BURR) IMPLANT
BUR STRYKR EGG 5.0 (BURR) IMPLANT
CANNULA SHOULDER 7CM (CANNULA) ×3 IMPLANT
CLOSURE STERI-STRIP 1/2X4 (GAUZE/BANDAGES/DRESSINGS) ×1
CLOSURE WOUND 1/2 X4 (GAUZE/BANDAGES/DRESSINGS)
CLSR STERI-STRIP ANTIMIC 1/2X4 (GAUZE/BANDAGES/DRESSINGS) ×1 IMPLANT
DRAPE STERI 35X30 U-POUCH (DRAPES) ×3 IMPLANT
DRAPE SURG 17X23 STRL (DRAPES) ×3 IMPLANT
DRAPE U-SHAPE 47X51 STRL (DRAPES) ×3 IMPLANT
DRSG ADAPTIC 3X8 NADH LF (GAUZE/BANDAGES/DRESSINGS) ×3 IMPLANT
DRSG PAD ABDOMINAL 8X10 ST (GAUZE/BANDAGES/DRESSINGS) ×4 IMPLANT
DURAPREP 26ML APPLICATOR (WOUND CARE) ×3 IMPLANT
GAUZE SPONGE 4X4 12PLY STRL (GAUZE/BANDAGES/DRESSINGS) ×2 IMPLANT
GLOVE BIOGEL PI IND STRL 8 (GLOVE) ×2 IMPLANT
GLOVE BIOGEL PI INDICATOR 8 (GLOVE) ×4
GLOVE ORTHO TXT STRL SZ7.5 (GLOVE) ×6 IMPLANT
GLOVE SURG ORTHO 8.0 STRL STRW (GLOVE) ×9 IMPLANT
GOWN STRL REUS W/ TWL LRG LVL3 (GOWN DISPOSABLE) ×2 IMPLANT
GOWN STRL REUS W/ TWL XL LVL3 (GOWN DISPOSABLE) ×2 IMPLANT
GOWN STRL REUS W/TWL LRG LVL3 (GOWN DISPOSABLE) ×15
GOWN STRL REUS W/TWL XL LVL3 (GOWN DISPOSABLE) ×6
KIT BASIN OR (CUSTOM PROCEDURE TRAY) ×3 IMPLANT
KIT ROOM TURNOVER OR (KITS) ×3 IMPLANT
MANIFOLD NEPTUNE II (INSTRUMENTS) ×3 IMPLANT
NDL SCORPION MULTI FIRE (NEEDLE) IMPLANT
NDL SPNL 18GX3.5 QUINCKE PK (NEEDLE) ×1 IMPLANT
NEEDLE 22X1 1/2 (OR ONLY) (NEEDLE) IMPLANT
NEEDLE SCORPION MULTI FIRE (NEEDLE) ×3 IMPLANT
NEEDLE SPNL 18GX3.5 QUINCKE PK (NEEDLE) ×3 IMPLANT
NS IRRIG 1000ML POUR BTL (IV SOLUTION) ×3 IMPLANT
PACK SHOULDER (CUSTOM PROCEDURE TRAY) ×3 IMPLANT
PAD ARMBOARD 7.5X6 YLW CONV (MISCELLANEOUS) ×6 IMPLANT
SET ARTHROSCOPY TUBING (MISCELLANEOUS) ×3
SET ARTHROSCOPY TUBING LN (MISCELLANEOUS) ×1 IMPLANT
SLING ULTRA II LARGE (SOFTGOODS) ×2 IMPLANT
SPONGE GAUZE 4X4 12PLY STER LF (GAUZE/BANDAGES/DRESSINGS) ×2 IMPLANT
SPONGE LAP 4X18 X RAY DECT (DISPOSABLE) ×4 IMPLANT
STRIP CLOSURE SKIN 1/2X4 (GAUZE/BANDAGES/DRESSINGS) IMPLANT
SUT ETHIBOND NAB CT1 #1 30IN (SUTURE) IMPLANT
SUT ETHILON 3 0 PS 1 (SUTURE) ×1 IMPLANT
SUT FIBERWIRE #2 38 REV NDL BL (SUTURE) ×3
SUT FIBERWIRE #2 38 T-5 BLUE (SUTURE) ×3
SUT MNCRL AB 3-0 PS2 18 (SUTURE) ×2 IMPLANT
SUT TIGER TAPE 7 IN WHITE (SUTURE) IMPLANT
SUT VIC AB 0 CT1 27 (SUTURE) ×6
SUT VIC AB 0 CT1 27XBRD ANBCTR (SUTURE) IMPLANT
SUT VIC AB 2-0 CT1 27 (SUTURE)
SUT VIC AB 2-0 CT1 TAPERPNT 27 (SUTURE) IMPLANT
SUTURE FIBERWR #2 38 T-5 BLUE (SUTURE) IMPLANT
SUTURE FIBERWR#2 38 REV NDL BL (SUTURE) IMPLANT
SYR 20ML ECCENTRIC (SYRINGE) IMPLANT
SYR CONTROL 10ML LL (SYRINGE) ×3 IMPLANT
TAPE FIBER 2MM 7IN #2 BLUE (SUTURE) IMPLANT
TOWEL OR 17X24 6PK STRL BLUE (TOWEL DISPOSABLE) ×3 IMPLANT
TOWEL OR 17X26 10 PK STRL BLUE (TOWEL DISPOSABLE) ×3 IMPLANT
WAND HAND CNTRL MULTIVAC 90 (MISCELLANEOUS) ×2 IMPLANT
WATER STERILE IRR 1000ML POUR (IV SOLUTION) ×3 IMPLANT

## 2014-09-23 NOTE — Brief Op Note (Signed)
09/23/2014  9:45 AM  PATIENT:  Mitchell [name]  69 y.o. male  PRE-OPERATIVE DIAGNOSIS:  LEFT SHOULDER ROTATOR CUFF TEAR  POST-OPERATIVE DIAGNOSIS:  LEFT SHOULDER ROTATOR CUFF TEAR  PROCEDURE:  Left shoulder arthroscopy debridement, Open acromioplasty distal clavicle excision rotator cuff repair  SURGEON:  Surgeon(s) and Role:    * Earlie Server, MD - Primary  PHYSICIAN ASSISTANT: Chriss Czar, PA-C  ASSISTANTS:   ANESTHESIA:   regional and general  EBL:  Total I/O In: 800 [I.V.:800] Out: -   BLOOD ADMINISTERED:none  DRAINS: none   LOCAL MEDICATIONS USED:  NONE  SPECIMEN:  No Specimen  DISPOSITION OF SPECIMEN:  N/A  COUNTS:  YES  TOURNIQUET:  * No tourniquets in log *  DICTATION: .Other Dictation: Dictation Number unknown  PLAN OF CARE: Admit for overnight observation  PATIENT DISPOSITION:  PACU - hemodynamically stable.   Delay start of Pharmacological VTE agent (>24hrs) due to surgical blood loss or risk of bleeding: not applicable

## 2014-09-23 NOTE — Progress Notes (Signed)
Orthopedic Tech Progress Note Patient Details:  Mitchell [name] 1946-06-16 [id number]  Ortho Devices Type of Ortho Device: Shoulder abduction pillow Ortho Device/Splint Interventions: Ordered As ordered  By Dr. Steele Sizer, Avelina Mcclurkin 09/23/2014, 9:09 AM

## 2014-09-23 NOTE — Anesthesia Preprocedure Evaluation (Addendum)
Anesthesia Evaluation  Patient identified by MRN, date of birth, ID band Patient awake    Reviewed: Allergy & Precautions, NPO status , Patient's Chart, lab work & pertinent test results, reviewed documented beta blocker date and time   Airway Mallampati: II   Neck ROM: Full    Dental  (+) Teeth Intact, Dental Advisory Given   Pulmonary COPD         Cardiovascular hypertension, Pt. on medications and Pt. on home beta blockers + CAD, + CABG, + Peripheral Vascular Disease and +CHF     Neuro/Psych Anxiety Depression negative neurological ROS     GI/Hepatic negative GI ROS, Neg liver ROS,   Endo/Other  diabetes, Type 2, Oral Hypoglycemic Agents  Renal/GU negative Renal ROS     Musculoskeletal   Abdominal   Peds  Hematology negative hematology ROS (+)   Anesthesia Other Findings   Reproductive/Obstetrics                           Anesthesia Physical Anesthesia Plan  ASA: III  Anesthesia Plan: General and Regional   Post-op Pain Management:    Induction: Intravenous  Airway Management Planned: Oral ETT  Additional Equipment:   Intra-op Plan:   Post-operative Plan: Extubation in OR  Informed Consent: I have reviewed the patients History and Physical, chart, labs and discussed the procedure including the risks, benefits and alternatives for the proposed anesthesia with the patient or authorized representative who has indicated his/her understanding and acceptance.   Dental advisory given  Plan Discussed with: CRNA  Anesthesia Plan Comments:         Anesthesia Quick Evaluation

## 2014-09-23 NOTE — Discharge Instructions (Signed)
Diet: As you were doing prior to hospitalization   Activity: Increase activity slowly as tolerated  No lifting or driving for 6 weeks   Shower: May shower without a dressing on post op day #3, NO SOAKING in tub   Dressing: You may change your dressing on post op day #3.  Then change the dressing daily with sterile 4"x4"s gauze dressing    Weight Bearing: nonweight bearing left arm.  Remain in sling except shower and pendulum exercises.   To prevent constipation: you may use a stool softener such as -  Colace ( over the counter) 100 mg by mouth twice a day  Drink plenty of fluids ( prune juice may be helpful) and high fiber foods  Miralax ( over the counter) for constipation as needed.   Precautions: If you experience chest pain or shortness of breath - call 911 immediately For transfer to the hospital emergency department!!  If you develop a fever greater that 101 F, purulent drainage from wound, increased redness or drainage from wound, or calf pain -- Call the office   Follow- Up Appointment: Please call for an appointment to be seen in 1 week  [facility] - [phone]

## 2014-09-23 NOTE — Anesthesia Procedure Notes (Addendum)
Anesthesia Regional Block:  Interscalene brachial plexus block  Pre-Anesthetic Checklist: ,, timeout performed, Correct Patient, Correct Site, Correct Laterality, Correct Procedure, Correct Position, site marked, Risks and benefits discussed,  Surgical consent,  Pre-op evaluation,  At surgeon's request and post-op pain management   Prep: chloraprep       Needles:  Injection technique: Single-shot  Needle Type: Echogenic Stimulator Needle     Needle Length: 9cm 9 cm Needle Gauge: 21 and 21 G    Additional Needles:  Procedures: ultrasound guided (picture in chart) and nerve stimulator Interscalene brachial plexus block  Nerve Stimulator or Paresthesia:  Response: deltoid, 0.5 mA,   Additional Responses:   Narrative:  Start time: 09/23/2014 7:20 AM End time: 09/23/2014 7:28 AM Injection made incrementally with aspirations every 5 mL.  Performed by: Personally  Anesthesiologist: Suzette Battiest  Additional Notes: Risks and benefits explained. Pt tolerated procedure well with no immediate complications.   Procedure Name: Intubation Date/Time: 09/23/2014 7:44 AM Performed by: Suzy Bouchard Pre-anesthesia Checklist: Patient identified, Timeout performed, Emergency Drugs available, Suction available and Patient being monitored Patient Re-evaluated:Patient Re-evaluated prior to inductionOxygen Delivery Method: Circle system utilized Preoxygenation: Pre-oxygenation with 100% oxygen Intubation Type: IV induction Ventilation: Mask ventilation without difficulty Laryngoscope Size: Miller and 2 Grade View: Grade I Tube type: Oral Tube size: 7.0 mm Number of attempts: 1 Airway Equipment and Method: Stylet and LTA kit utilized Placement Confirmation: ETT inserted through vocal cords under direct vision,  breath sounds checked- equal and bilateral and positive ETCO2 Secured at: 24 cm Tube secured with: Tape Dental Injury: Teeth and Oropharynx as per pre-operative assessment

## 2014-09-23 NOTE — Plan of Care (Signed)
Problem: Consults Goal: Diagnosis - Shoulder Surgery Outcome: Completed/Met Date Met:  09/23/14 Rotator Cuff

## 2014-09-23 NOTE — Anesthesia Postprocedure Evaluation (Signed)
  Anesthesia Post-op Note  Patient: Mitchell [name]  Procedure(s) Performed: Procedure(s): SHOULDER ARTHROSCOPY WITH ROTATOR CUFF REPAIR AND SUBACROMIAL DECOMPRESSION DISTAL CLAVICLE RESECTION (Left)  Patient Location: PACU  Anesthesia Type:GA combined with regional for post-op pain  Level of Consciousness: awake and alert   Airway and Oxygen Therapy: Patient Spontanous Breathing  Post-op Pain: mild  Post-op Assessment: Post-op Vital signs reviewed  Post-op Vital Signs: Reviewed  Last Vitals:  Filed Vitals:   09/23/14 1112  BP: 140/70  Pulse: 71  Temp: 36.8 C  Resp: 18    Complications: No apparent anesthesia complications

## 2014-09-23 NOTE — Op Note (Signed)
NAME:  Mitchell [name], Mitchell [name] NO.:  [id number]  MEDICAL RECORD NO.:  [id number]  LOCATION:  [facility]                         FACILITY:  [name]  PHYSICIAN:  Lockie Pares, M.D.    DATE OF BIRTH:  1945/08/12  DATE OF PROCEDURE:  09/23/2014 DATE OF DISCHARGE:                              OPERATIVE REPORT   INDICATIONS:  This is a 69 year old, MRI proven symptomatic cuff severe tear, thought to be amenable to hospitalization due to medical history including pulmonary issues.  PREOPERATIVE DIAGNOSES: 1. Massive retracted rotator cuff tear, left shoulder operation. 2. Biceps partial tendon tear. 3. Impingement. 4. AC joint arthritis. 5. Degenerative tearing in anterior superior labrum.  POSTOPERATIVE DIAGNOSES: 1. Massive retracted rotator cuff tear, left shoulder operation. 2. Biceps partial tendon tear. 3. Impingement. 4. AC joint arthritis. 5. Degenerative tearing in anterior superior labrum.  OPERATION: 1. Arthroscopic debridement (extensive). 2. Open rotator cuff repair with acromioplasty (chronic). 3. Open distal clavicle off for the left shoulder.  SURGEON:  W.D. French Ana, M.D.  ASSISTANT:  Chriss Czar, PA-C  ANESTHESIA:  General with nerve block.  BLOOD LOSS:  Minimal.  DESCRIPTION OF PROCEDURE:  Arthroscope in the beach chair position, after general anesthetic nerve block with posterior and anterior portals made.  He did not have any glenohumeral degenerative change.  He had significant degenerative tearing in the anterior superior labrum with involvement of the biceps and longitudinal split of the biceps leading to the need to do a tenotomy.  There was a large retracted complete tear of the supraspinatus with retraction with some involvement of the infraspinatus very thin attenuated tissue retracted at least halfway back to the level of the glenoid.  We decided at that point after great extensive arthroscopic debridement, converted procedure to  incision, bisecting the acromial AC interval with splitting of the deltoid about 2 cm distal tip of the acromion, very thickened hypertrophic acromion.  We also performed a partial acromionectomy due to the anterior leading edge and thickening as well and then extensive AC joint arthritis was noted to be size of 1 cm to 1.5 cm of distal clavicle.  This revealed a large attenuated V-shaped tear.  We burred the tuberosity, then converted to V to more of a transverse component.  The tissue was thin and attenuated, but did hold the suture.  Once we converted this to a transverse component, we placed 135 bio corkscrew with 2 sutures attached to bring this back down to near but not quite an anatomic attachment site and then we oversewed the edge beyond that leading to not quite, but nearly a watertight repair.  Again, the main issue in this case was the attenuated nature of the cuff tissue in general.  Closure was affected after irrigation with #2 FiberWire on the deltoid, 2-0 Vicryl and Monocryl on the skin.  Lightly compressive dressing, the mini pillows were applied, taken to recovery room in stable condition.     Lockie Pares, M.D.     WDC/MEDQ  D:  09/23/2014  T:  09/23/2014  Job:  [id number]

## 2014-09-23 NOTE — Transfer of Care (Signed)
Immediate Anesthesia Transfer of Care Note  Patient: Mitchell [name]  Procedure(s) Performed: Procedure(s): SHOULDER ARTHROSCOPY WITH ROTATOR CUFF REPAIR AND SUBACROMIAL DECOMPRESSION DISTAL CLAVICLE RESECTION (Left)  Patient Location: PACU  Anesthesia Type:General combined with block for post op pain  Level of Consciousness: awake and alert   Airway & Oxygen Therapy: Patient Spontanous Breathing and Patient connected to nasal cannula oxygen  Post-op Assessment: Report given to RN, Post -op Vital signs reviewed and stable and Patient moving all extremities, able to wiggle left fingers  Post vital signs: Reviewed and stable  Last Vitals:  Filed Vitals:   09/23/14 0554  BP: 130/62  Pulse: 70  Resp: 18    Complications: No apparent anesthesia complications

## 2014-09-23 NOTE — H&P (View-Only) (Signed)
Mitchell [name] is an 69 y.o. male.   Chief Complaint: left rotator cuff tear  HPI: Chief complaint:  Left rotator cuff tear. He saw Dr. Micheline Chapman years ago after a fall in 2008, at that time was recommended to have surgery, his impression was he did not have a reparable lesion and there's an obvious discrepancy there. He's here with severe pain. He had a cardiac workup and neurosurgery workup and tells me he does not have significant disc problems in his neck. Past medical history is significant for diabetes mellitus hypertension and coronary artery disease with history of heart attack. Family history significant for hypertension and coronary artery disease. He's a nonsmoker and retired. Medications: Norvasc, aspirin, Atorvastatin, Lotensin, Hygroton, Plavis, Coreg, Glucotrol, takes 5 Hydrocodone for chronic back pain through Dr. Arelia Sneddon also takes Metformin, multivitamins, Nortriptyline, potassium chloride Advil Spiriva and Desyrel. We repeated MRI showing that his tear has gotten worse and it's retracted 1.3 from front to back, medial to the humeral head but not to the level of the glenoid making it probably a 75% chance of repairing it. He has chronic pain.   Past Medical History  Diagnosis Date  . CAD (coronary artery disease)     patient had non-ST-elevation MI in February 2008 and there was an 80-90% proximal LAD stenosis, 90% proximal first diagonal stenosis, 99% ostial ramus stenosis. The first obtuse marginal was subtotally occluded and there was an 80% mid RCA stenosis. EF was estimated 25% on ventriculogram, coronary artery bypass grafting was done with a LIMA to the LAD, sequential saphenous vein graft to the PDA,  . Aortic insufficiency   . AAA (abdominal aortic aneurysm)     ultrasound done in 3/11 and showed AAA 3.9 x 3.9cm  . HTN (hypertension)   . DM2 (diabetes mellitus, type 2)   . Carotid stenosis   . Torn rotator cuff     hx of. left   . History of PFTs    obstructive/restrictive. There is some reversibility with bronchodilator. The patient actually never smoked. He is on Spiriva  . Cancer     melanoma  . Altered taste   . Pain     back  . COPD (chronic obstructive pulmonary disease)   . Melanoma 09/2010    pT2a N0 M0; clark level IV.   Marland Kitchen Range of motion deficit 09/19/2011  . Myocardial infarction   . Diabetes mellitus   . Irregular heart beat   . Anxiety   . Depression   . Shortness of breath     with exeration  . CHF (congestive heart failure)     Past Surgical History  Procedure Laterality Date  . Tonsillectomy  1951  . Arterial bypass surgry    . Melanoma excision    . Blood clots removed    . Application of wound vac    . Coronary artery bypass graft  07/2006  . Tonsillectomy    . Abdominal aortic endovascular stent graft N/A 12/11/2012    Procedure: ABDOMINAL AORTIC ENDOVASCULAR STENT GRAFT GORE;  Surgeon: Serafina Mitchell, MD;  Location: [facility] OR;  Service: Vascular;  Laterality: N/A;  . Abdominal aortic aneurysm repair      Family History  Problem Relation Age of Onset  . Hyperlipidemia Mother   . Hypertension Mother   . Heart disease Mother   . Hypertension Father   . AAA (abdominal aortic aneurysm) Father   . Heart disease Father   . Hyperlipidemia Father   . Throat cancer  Maternal Uncle    Social History:  reports that he has never smoked. He has never used smokeless tobacco. He reports that he does not drink alcohol or use illicit drugs.  Allergies:  Allergies  Allergen Reactions  . Zocor [Simvastatin] Other (See Comments)    Bone Pain     (Not in a hospital admission)  Results for orders placed or performed in visit on 09/20/14 (from the past 48 hour(s))  CBC with Differential/Platelet     Status: None   Collection Time: 09/20/14 11:48 AM  Result Value Ref Range   WBC 6.6 4.0 - 10.3 10e3/uL   NEUT# 3.6 1.5 - 6.5 10e3/uL   HGB 14.1 13.0 - 17.1 g/dL   HCT 40.6 38.4 - 49.9 %   Platelets 160 140 - 400  10e3/uL   MCV 96.0 79.3 - 98.0 fL   MCH 33.3 27.2 - 33.4 pg   MCHC 34.7 32.0 - 36.0 g/dL   RBC 4.23 4.20 - 5.82 10e6/uL   RDW 12.7 11.0 - 14.6 %   lymph# 2.1 0.9 - 3.3 10e3/uL   MONO# 0.8 0.1 - 0.9 10e3/uL   Eosinophils Absolute 0.1 0.0 - 0.5 10e3/uL   Basophils Absolute 0.0 0.0 - 0.1 10e3/uL   NEUT% 54.0 39.0 - 75.0 %   LYMPH% 31.1 14.0 - 49.0 %   MONO% 12.7 0.0 - 14.0 %   EOS% 2.0 0.0 - 7.0 %   BASO% 0.2 0.0 - 2.0 %  Comprehensive metabolic panel     Status: Abnormal   Collection Time: 09/20/14 11:48 AM  Result Value Ref Range   Sodium 140 136 - 145 mEq/L   Potassium 4.2 3.5 - 5.1 mEq/L   Chloride 106 98 - 109 mEq/L   CO2 25 22 - 29 mEq/L   Glucose 129 70 - 140 mg/dl   BUN 14.6 7.0 - 26.0 mg/dL   Creatinine 1.1 0.7 - 1.3 mg/dL   Total Bilirubin 0.48 0.20 - 1.20 mg/dL   Alkaline Phosphatase 49 40 - 150 U/L   AST 20 5 - 34 U/L   ALT 19 0 - 55 U/L   Total Protein 7.7 6.4 - 8.3 g/dL   Albumin 3.8 3.5 - 5.0 g/dL   Calcium 8.9 8.4 - 10.4 mg/dL   Anion Gap 10 3 - 11 mEq/L   EGFR 69 (L) >90 ml/min/1.73 m2    Comment: eGFR is calculated using the CKD-EPI Creatinine Equation (2009)  Lactate dehydrogenase     Status: None   Collection Time: 09/20/14 11:48 AM  Result Value Ref Range   LDH 170 125 - 245 U/L   No results found.  Review of Systems  Constitutional: Negative.   HENT: Negative.   Eyes: Negative.   Respiratory: Positive for shortness of breath.   Cardiovascular: Negative.   Gastrointestinal: Negative.   Genitourinary: Negative.   Musculoskeletal: Positive for myalgias and joint pain.  Skin: Negative.   Neurological: Negative.   Endo/Heme/Allergies: Bruises/bleeds easily.  Psychiatric/Behavioral: Positive for depression.    There were no vitals taken for this visit. Physical Exam  Constitutional: He is oriented to person, place, and time. He appears well-developed and well-nourished. No distress.  HENT:  Head: Normocephalic and atraumatic.  Nose: Nose  normal.  Eyes: Conjunctivae and EOM are normal. Pupils are equal, round, and reactive to light.  Neck: Normal range of motion. Neck supple.  Cardiovascular: Normal rate and intact distal pulses.   Respiratory: Accessory muscle usage present. Tachypnea noted.  GI: Soft.  He exhibits no distension. There is no tenderness.  Musculoskeletal:       Left shoulder: He exhibits decreased range of motion, tenderness, bony tenderness, pain and decreased strength.  Neurological: He is alert and oriented to person, place, and time.  Skin: Skin is warm and dry. No rash noted. No erythema.  Psychiatric: He has a normal mood and affect. His behavior is normal.     Assessment/Plan Left shoulder rotator cuff tear  He has a well-defined shoulder problem and would be candidate for debridement open rotator cuff repair acromioplasty and distal clavicle excision. Discussed risks benefits and possible complications in detail. He's on Plavix will need to be off that. He recently passed a stress test and we obtained surgical clearance from pulmonology and cardiology.  He's on chronic Hydrocodone for back pain and we'll upgrade him to Oxycodone after surgery. We'll do this as an overnight due to the fact he lives alone and he will follow-up with me after surgery  Saksham Akkerman, Vonna Kotyk 09/20/2014, 1:06 PM

## 2014-09-23 NOTE — Interval H&P Note (Signed)
History and Physical Interval Note:  09/23/2014 7:29 AM  Mitchell [name]  has presented today for surgery, with the diagnosis of LEFT SHOULDER ROTATOR CUFF TEAR  The various methods of treatment have been discussed with the patient and family. After consideration of risks, benefits and other options for treatment, the patient has consented to  Procedure(s): SHOULDER ARTHROSCOPY WITH ROTATOR CUFF REPAIR AND SUBACROMIAL DECOMPRESSION DISTAL CLAVICLE RESECTION (Left) as a surgical intervention .  The patient's history has been reviewed, patient examined, no change in status, stable for surgery.  I have reviewed the patient's chart and labs.  Questions were answered to the patient's satisfaction.     Paytan Recine JR,W D

## 2014-09-24 DIAGNOSIS — M75102 Unspecified rotator cuff tear or rupture of left shoulder, not specified as traumatic: Secondary | ICD-10-CM | POA: Diagnosis not present

## 2014-09-24 LAB — GLUCOSE, CAPILLARY
Glucose-Capillary: 206 mg/dL — ABNORMAL HIGH (ref 70–99)
Glucose-Capillary: 104 mg/dL — ABNORMAL HIGH (ref 70–99)

## 2014-09-26 ENCOUNTER — Encounter (HOSPITAL_COMMUNITY): Payer: Self-pay | Admitting: Orthopedic Surgery

## 2015-01-01 ENCOUNTER — Other Ambulatory Visit: Payer: Self-pay | Admitting: Cardiology

## 2015-01-02 ENCOUNTER — Other Ambulatory Visit: Payer: Self-pay

## 2015-01-02 MED ORDER — CLOPIDOGREL BISULFATE 75 MG PO TABS: 75.0000 mg | ORAL_TABLET | Freq: Every day | ORAL | Status: DC

## 2015-01-12 ENCOUNTER — Other Ambulatory Visit (HOSPITAL_COMMUNITY): Payer: Self-pay | Admitting: Urology

## 2015-01-12 DIAGNOSIS — R972 Elevated prostate specific antigen [PSA]: Secondary | ICD-10-CM

## 2015-01-16 ENCOUNTER — Encounter: Payer: Self-pay | Admitting: Family

## 2015-01-16 ENCOUNTER — Ambulatory Visit: Payer: Medicare Other | Admitting: Family

## 2015-01-16 ENCOUNTER — Other Ambulatory Visit (HOSPITAL_COMMUNITY): Payer: Medicare Other

## 2015-01-18 ENCOUNTER — Ambulatory Visit (INDEPENDENT_AMBULATORY_CARE_PROVIDER_SITE_OTHER): Payer: Medicare Other | Admitting: Family

## 2015-01-18 ENCOUNTER — Encounter: Payer: Self-pay | Admitting: Family

## 2015-01-18 ENCOUNTER — Ambulatory Visit (HOSPITAL_COMMUNITY)
Admission: RE | Admit: 2015-01-18 | Discharge: 2015-01-18 | Disposition: A | Discharge status: Home / Self Care | Payer: Medicare Other | Source: Ambulatory Visit | Attending: Family | Admitting: Family

## 2015-01-18 VITALS — BP 112/70 | HR 70 | Temp 97.2°F | Resp 16 | Ht 72.0 in | Wt 178.0 lb

## 2015-01-18 DIAGNOSIS — Z95828 Presence of other vascular implants and grafts: Secondary | ICD-10-CM | POA: Diagnosis not present

## 2015-01-18 DIAGNOSIS — I714 Abdominal aortic aneurysm, without rupture, unspecified: Secondary | ICD-10-CM

## 2015-01-18 DIAGNOSIS — I6523 Occlusion and stenosis of bilateral carotid arteries: Secondary | ICD-10-CM | POA: Diagnosis not present

## 2015-01-18 NOTE — Progress Notes (Signed)
[facility]  Established [facility]  History of Present Illness  Mitchell [name] is a 69 y.o. (February 17, 1946) male patient of Dr. Trula Slade who returns today for followup. 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 He has a known type II endoleak. The patient also has known carotid stenosis which is asymptomatic, this is followed by Dr. Aundra Dubin.  Dr. Trula Slade last saw pt on 07/18/14. At that time by CT scan the aneurysm sac had gotten slightly smaller despite a persistent type II endoleak. Dr. Trula Slade discussed this with the patient and  that if his aneurysm starts to increase in size that he would consider embolization. No intervention was recommended at that time.Pt returns today for 6 months follow-up with an ultrasound.   Pt states his left arm has been weak since his left rotator cuff issue and surgery, and his right arm has been weak since his 2008 CABG x 5 vessels. Pt states his father had a "stomach aneurysm" and his mother had an" aortic aneurysm next to her heart".  States he has had thoracic left ribcage area back pain ever since he had surgery to remove a melanoma, denies any new back pain. He denies abdominal pain.   He had left rotator cuff surgery 09/23/14, is receiving physical therapy for this.   He is on a statin, Plavix, and ASA.  Pt Diabetic: Yes, states his last A1C was 6.0 Pt smoker: non-smoker   Past Medical History  Diagnosis Date  . CAD (coronary artery disease)     patient had non-ST-elevation MI in February 2008 and there was an 80-90% proximal LAD stenosis, 90% proximal first diagonal stenosis, 99% ostial ramus stenosis. The first obtuse marginal was subtotally occluded and there was an 80% mid RCA stenosis. EF was estimated 25% on ventriculogram, coronary artery bypass grafting was done with a LIMA to the LAD, sequential saphenous vein graft to the PDA,  . Aortic insufficiency   . AAA (abdominal  aortic aneurysm)     ultrasound done in 3/11 and showed AAA 3.9 x 3.9cm  . HTN (hypertension)   . DM2 (diabetes mellitus, type 2)   . Carotid stenosis   . Torn rotator cuff     hx of. left   . History of PFTs     obstructive/restrictive. There is some reversibility with bronchodilator. The patient actually never smoked. He is on Spiriva  . Cancer     melanoma  . Altered taste   . Pain     back  . COPD (chronic obstructive pulmonary disease)   . Melanoma 09/2010    pT2a N0 M0; clark level IV.   Marland Kitchen Range of motion deficit 09/19/2011  . Myocardial infarction   . Diabetes mellitus   . Irregular heart beat   . Anxiety   . Depression   . Shortness of breath     with exeration  . CHF (congestive heart failure)    Past Surgical History  Procedure Laterality Date  . Tonsillectomy  1951  . Arterial bypass surgry    . Melanoma excision    . Blood clots removed    . Application of wound vac    . Coronary artery bypass graft  07/2006  . Tonsillectomy    . Abdominal aortic endovascular stent graft N/A 12/11/2012    Procedure: ABDOMINAL AORTIC ENDOVASCULAR STENT GRAFT GORE;  Surgeon: Serafina Mitchell, MD;  Location: [facility];  Service: Vascular;  Laterality: N/A;  .  Abdominal aortic aneurysm repair    . Shoulder arthroscopy with rotator cuff repair and subacromial decompression Left 09/23/2014    Procedure: SHOULDER ARTHROSCOPY WITH ROTATOR CUFF REPAIR AND SUBACROMIAL DECOMPRESSION DISTAL CLAVICLE RESECTION;  Surgeon: Earlie Server, MD;  Location: [facility];  Service: Orthopedics;  Laterality: Left;   Social History History  Substance Use Topics  . Smoking status: Never Smoker   . Smokeless tobacco: Never Used  . Alcohol Use: No   Family History Family History  Problem Relation Age of Onset  . Hyperlipidemia Mother   . Hypertension Mother   . Heart disease Mother   . Hypertension Father   . AAA (abdominal aortic aneurysm) Father   . Heart disease Father   . Hyperlipidemia Father   .  Throat cancer Maternal Uncle    Current Outpatient Prescriptions on File Prior to Visit  Medication Sig Dispense Refill  . amLODipine (NORVASC) 10 MG tablet Take 10 mg by mouth daily.     Marland Kitchen aspirin 81 MG chewable tablet Chew 81 mg by mouth daily.    Marland Kitchen atorvastatin (LIPITOR) 40 MG tablet Take 40 mg by mouth daily.    . benazepril (LOTENSIN) 40 MG tablet Take 40 mg by mouth daily.     . carvedilol (COREG) 25 MG tablet Take 1 tablet (25 mg total) by mouth 2 (two) times daily.    . chlorthalidone (HYGROTON) 25 MG tablet Take 1 tablet (25 mg total) by mouth daily. 90 tablet 3  . cholecalciferol (VITAMIN D) 1000 UNITS tablet Take 1,000 Units by mouth daily.    . clopidogrel (PLAVIX) 75 MG tablet Take 1 tablet (75 mg total) by mouth daily. 30 tablet 0  . Coenzyme Q10 200 MG capsule Take 200 mg by mouth daily.    [name] Sodium (COLACE PO) Take 1 capsule by mouth 2 (two) times daily as needed (for constipation).     Marland Kitchen glipiZIDE (GLUCOTROL) 10 MG tablet Take 10 mg by mouth daily.     . metFORMIN (GLUMETZA) 500 MG (MOD) 24 hr tablet Take 500 mg by mouth daily with breakfast.    . Multiple Vitamin (MULTIVITAMIN) tablet Take 1 tablet by mouth daily.      . nortriptyline (PAMELOR) 50 MG capsule Take 50 mg by mouth at bedtime.    . Omega-3 Fatty Acids (FISH OIL TRIPLE STRENGTH) 1400 MG CAPS Take 1,400 mg by mouth every morning.    Marland Kitchen oxyCODONE-acetaminophen (ROXICET) 5-325 MG per tablet Take 1-2 tablets by mouth every 4 (four) hours as needed for severe pain. 75 tablet 0  . potassium chloride SA (K-DUR,KLOR-CON) 20 MEQ tablet Take 1 tablet (20 mEq total) by mouth 2 (two) times daily. 180 tablet 3  . sildenafil (VIAGRA) 50 MG tablet Take 50 mg by mouth daily as needed for erectile dysfunction.    Marland Kitchen tiotropium (SPIRIVA) 18 MCG inhalation capsule Place 18 mcg into inhaler and inhale daily.      . traZODone (DESYREL) 100 MG tablet Take 100 mg by mouth at bedtime.     Marland Kitchen HYDROcodone-acetaminophen  (NORCO/VICODIN) 5-325 MG per tablet Take 1-2 tablets by mouth every 4 (four) hours as needed for moderate pain.      No current facility-administered medications on file prior to visit.   Allergies  Allergen Reactions  . Zocor [Simvastatin] Other (See Comments)    Bone Pain     ROS: See HPI for pertinent positives and negatives.  Physical Examination  Filed Vitals:   01/18/15 0909  BP:  112/70  Pulse: 70  Temp: 97.2 F (36.2 C)  Resp: 16  Height: 6' (1.829 m)  Weight: 178 lb (80.74 kg)  SpO2: 98%   Body mass index is 24.14 kg/(m^2).  General: A&O x 3, WD  Pulmonary: Sym exp, good air movt, CTAB, no rales, rhonchi, or wheezing.   Cardiac: RRR, Nl S1, S2, no murmur appreciated  Vascular: Vessel Right Left  Radial 1+Palpable Not Palpable  Brachial 2+Palpable 2+Palpable  Carotid audible without bruit  audible without bruit  Aorta Not palpable N/A  Femoral Not Palpable 1+Palpable  Popliteal Not palpable Not palpable  PT 2+Palpable 1+Palpable  DP Faintly Palpable 1+Palpable   Gastrointestinal: soft, NTND, -G/R, - HSM, - palpable masses, - CVAT B.  Musculoskeletal: M/S 5/5 in both lower extremities, 3/5 in upper extremities, extremities without ischemic changes.  Neurologic: Pain and light touch intact in extremities, Motor exam as listed above  Non-Invasive Vascular Imaging  [facility] Duplex (Date: 01/18/2015) ABDOMINAL AORTA DUPLEX EVALUATION - POST ENDOVASCULAR REPAIR    INDICATION: Follow up endovascular repair    PREVIOUS INTERVENTION(S): [facility] 12/11/2012 with type 11 endoleak 07/18/2014 by Dr. Trula Slade    DUPLEX EXAM:      DIAMETER AP (cm) DIAMETER TRANSVERSE (cm) VELOCITIES (cm/sec)  Aorta 4.7 - 105  Right Common Iliac 1.4 1.1 67  Left Common Iliac 1.5 1.2 50    Comparison Study       Date DIAMETER AP (cm) DIAMETER TRANSVERSE (cm)  CT 07/18/2014 5.1 -     ADDITIONAL FINDINGS:     IMPRESSION: 1. Technically difficult exam due to excessive bowel  gas. 2. The sac size measures 4.7 cm AP. 3. Unable to visualize endoleak    Compared to the previous exam:  Decreased since CT 07/18/14      Medical Decision Making  Mitchell [name] is a 69 y.o. male who is status post cutaneous endovascular abdominal aortic aneurysm repair on 12/11/2012. He has a known type II endoleak.  I discussed with the patient the importance of surveillance of the endograft.  After discussing with Dr. Scot Dock, the next endograft duplex will be scheduled for 6 months.  The patient will follow up with [place] in 6 months with these studies.  I emphasized the importance of maximal medical management including strict control of blood pressure, blood glucose, and lipid levels, antiplatelet agents, obtaining regular exercise, and cessation of smoking.   Thank you for allowing [place] to participate in this patient's care.  Clemon Chambers, RN, MSN, FNP-C [facility] [place] Office: [phone]  Clinic Physician: Scot Dock  01/18/2015, 9:12 AM

## 2015-01-19 NOTE — Addendum Note (Signed)
Addended by: Dorthula Rue L on: 01/19/2015 10:51 AM   Modules accepted: Orders

## 2015-01-25 ENCOUNTER — Other Ambulatory Visit (HOSPITAL_COMMUNITY): Payer: Self-pay | Admitting: Urology

## 2015-01-25 ENCOUNTER — Ambulatory Visit (HOSPITAL_COMMUNITY)
Admission: RE | Admit: 2015-01-25 | Discharge: 2015-01-25 | Disposition: A | Discharge status: Home / Self Care | Payer: Medicare Other | Source: Ambulatory Visit | Attending: Urology | Admitting: Urology

## 2015-01-25 DIAGNOSIS — R972 Elevated prostate specific antigen [PSA]: Secondary | ICD-10-CM

## 2015-02-03 ENCOUNTER — Other Ambulatory Visit: Payer: Self-pay | Admitting: Cardiology

## 2015-02-27 ENCOUNTER — Other Ambulatory Visit: Payer: Self-pay | Admitting: Cardiology

## 2015-04-07 ENCOUNTER — Ambulatory Visit: Payer: Medicare Other | Admitting: Physician Assistant

## 2015-04-12 ENCOUNTER — Other Ambulatory Visit: Payer: Self-pay | Admitting: Cardiology

## 2015-04-12 NOTE — Progress Notes (Signed)
Cardiology Office Note   Date:  04/13/2015   ID:  Mitchell, [name] 06/27/1945, MRN [id number]  PCP:  Leonard Downing, MD  Cardiologist:  Dr. Loralie Champagne   Electrophysiologist:  n/a   Chief Complaint  Patient presents with  . Follow-up  . Coronary Artery Disease  . Congestive Heart Failure     History of Present Illness: Mitchell [name] is a 69 y.o. male with a hx of CAD s/p CABG, AAA s/p repair, HTN, DM2.  In 6/14 he had EVAR for repair of AAA. 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 Colin Broach seen by Dr. Aundra Dubin 12/15.  Here for FU. Overall doing well.  He denies chest pain. He has chronic DOE with mod activities. He denies any change.  Sleeps on 2 pillows.  No change.  Denies PND, edema, syncope.    Studies/Reports Reviewed Today:  Carotid US 3/16 Bilateral 40-59% ICA stenosis >> FU 1 year  Myoview 12/15 EF 52%, no ischemia, low risk  Echo 8/15 EF 55-60%, normal wall motion, mild AI, mild MR, mild LAE, mild RVE, atrial septal lipomatous hypertrophy, mild TR  Renal Art [place] 5/13 Normal renal arteries bilaterally  LHC 2/08 LM:  Ok LAD:  prox 80-90% and  80% after D1, D1 prox 90% RI: ostial and prox 99% LCx:  OM occluded RCA:  prox 30%, mid 80%, PLA prox 90% and mid 50% EF 25%   Past Medical History  Diagnosis Date  . CAD (coronary artery disease)     patient had non-ST-elevation MI in February 2008 and there was an 80-90% proximal LAD stenosis, 90% proximal first diagonal stenosis, 99% ostial ramus stenosis. The first obtuse marginal was subtotally occluded and there was an 80% mid RCA stenosis. EF was estimated 25% on ventriculogram, coronary artery bypass grafting was done with a LIMA to the LAD, sequential saphenous vein graft to the PDA,  . Aortic insufficiency   . AAA (abdominal aortic aneurysm) ([place])     ultrasound done in 3/11 and showed AAA 3.9 x 3.9cm  . HTN (hypertension)   . DM2 (diabetes mellitus,  type 2) ([place])   . Carotid stenosis   . Torn rotator cuff     hx of. left   . History of PFTs     obstructive/restrictive. There is some reversibility with bronchodilator. The patient actually never smoked. He is on Spiriva  . Cancer ([place])     melanoma  . Altered taste   . Pain     back  . COPD (chronic obstructive pulmonary disease) ([place])   . Melanoma ([place]) 09/2010    pT2a N0 M0; clark level IV.   Marland Kitchen Range of motion deficit 09/19/2011  . Myocardial infarction ([place])   . Diabetes mellitus   . Irregular heart beat   . Anxiety   . Depression   . Shortness of breath     with exeration  . CHF (congestive heart failure) ([place])   1. Coronary artery disease. The patient had non-ST-elevation MI in February 2008 and there was an 80-90% proximal LAD stenosis, 90%proximal first diagonal stenosis, 99% ostial ramus stenosis. The first obtuse marginal was subtotally occluded and there was an 80% mid RCA stenosis. EF was estimated 25% on ventriculogram, coronary artery bypass grafting was done with a LIMA to the LAD, sequential saphenous vein graft to the PDA, and PLOM, saphenous graft to the diagonal, saphenous vein graft to the ramus. 2. Aortic insufficiency: Echo (  3/11): EF 55-60%, moderate diastolic dysfunction, moderate aortic insufficiency, moderate LAE. Echo (3/12) with EF 50-55%, grade II diastolic dysfunction, mild MR, mild aortic insufficiency. Echo (9/15) with EF 55-60%, mild AI, mild MR, mildly dilated RV with normal systolic function.  3. Infrarenal AAA. Abdominal US 3/11 with AAA 3.9 x 3.9 cm. Abdominal US 3/12 with AAA 3.8 x 3.9 cm. Abdominal US (3/13) with AAA 4.6 x 3.9 with complex plaque within the AAA. CTA abdomen showed 4 cm saccular infrarenal aneurysm. CTA abdomen 5/14 showed saccular AAA grown to 5.3 cm. EVAR for repair in 6/14.  4. Hypertension: Renal artery dopplers in 5/13 with no significant renal artery stenosis.  5. Type 2 diabetes. 6. History of torn left rotator  cuff. 7. History of obstructive/restrictive PFTs. There is some reversibility with bronchodilator. The patient actually never smoked. He is on Spiriva. 8. Holter (3/10): Frequent PACs and blocked PACs. PVCs with trigeminy. No long pauses. Slowest HR in 40s while asleep.  9. Carotid stenosis. Carotid dopplers (3/12) with stable 40-59% bilateral ICA stenosis. Carotid dopplers (3/13) with 40-59% bilateral stenosis. Carotid dopplers (3/14) with 40-59% bilateral stenosis. Carotid dopplers (3/15) with 40-59% bilateral ICA stenosis. 10. Melanoma: s/p excision from back 5/12.    Past Surgical History  Procedure Laterality Date  . Tonsillectomy  1951  . Arterial bypass surgry    . Melanoma excision    . Blood clots removed    . Application of wound vac    . Coronary artery bypass graft  07/2006  . Tonsillectomy    . Abdominal aortic endovascular stent graft N/A 12/11/2012    Procedure: ABDOMINAL AORTIC ENDOVASCULAR STENT GRAFT GORE;  Surgeon: Serafina Mitchell, MD;  Location: [facility] OR;  Service: Vascular;  Laterality: N/A;  . Abdominal aortic aneurysm repair    . Shoulder arthroscopy with rotator cuff repair and subacromial decompression Left 09/23/2014    Procedure: SHOULDER ARTHROSCOPY WITH ROTATOR CUFF REPAIR AND SUBACROMIAL DECOMPRESSION DISTAL CLAVICLE RESECTION;  Surgeon: Earlie Server, MD;  Location: [facility];  Service: Orthopedics;  Laterality: Left;     Current Outpatient Prescriptions  Medication Sig Dispense Refill  . amLODipine (NORVASC) 10 MG tablet Take 10 mg by mouth daily.     Marland Kitchen aspirin 81 MG chewable tablet Chew 81 mg by mouth daily.    Marland Kitchen atorvastatin (LIPITOR) 40 MG tablet Take 40 mg by mouth daily.    . benazepril (LOTENSIN) 40 MG tablet Take 40 mg by mouth daily.     . carvedilol (COREG) 25 MG tablet Take 1 tablet (25 mg total) by mouth 2 (two) times daily.    . chlorthalidone (HYGROTON) 25 MG tablet Take 1 tablet (25 mg total) by mouth daily. 90 tablet 3  . cholecalciferol  (VITAMIN D) 1000 UNITS tablet Take 1,000 Units by mouth daily.    . clopidogrel (PLAVIX) 75 MG tablet Take 1 tablet (75 mg total) by mouth daily. 90 tablet 3  . Coenzyme Q10 200 MG capsule Take 200 mg by mouth daily.    [name] Sodium (COLACE PO) Take 1 capsule by mouth 2 (two) times daily as needed (for constipation).     Marland Kitchen glipiZIDE (GLUCOTROL) 10 MG tablet Take 10 mg by mouth daily.     Marland Kitchen HYDROcodone-acetaminophen (NORCO/VICODIN) 5-325 MG per tablet Take 1-2 tablets by mouth every 4 (four) hours as needed for moderate pain.     . metFORMIN (GLUMETZA) 500 MG (MOD) 24 hr tablet Take 500 mg by mouth daily with breakfast.    .  Multiple Vitamin (MULTIVITAMIN) tablet Take 1 tablet by mouth daily.      . nortriptyline (PAMELOR) 50 MG capsule Take 50 mg by mouth at bedtime.    . Omega-3 Fatty Acids (FISH OIL TRIPLE STRENGTH) 1400 MG CAPS Take 1,400 mg by mouth every morning.    Marland Kitchen oxyCODONE-acetaminophen (ROXICET) 5-325 MG per tablet Take 1-2 tablets by mouth every 4 (four) hours as needed for severe pain. 75 tablet 0  . potassium chloride SA (K-DUR,KLOR-CON) 20 MEQ tablet Take 1 tablet (20 mEq total) by mouth 2 (two) times daily. 180 tablet 3  . sildenafil (VIAGRA) 50 MG tablet Take 50 mg by mouth daily as needed for erectile dysfunction.    Marland Kitchen tiotropium (SPIRIVA) 18 MCG inhalation capsule Place 18 mcg into inhaler and inhale daily.      . traZODone (DESYREL) 100 MG tablet Take 100 mg by mouth at bedtime.      No current facility-administered medications for this visit.    Allergies:   Zocor    Social History:   Social History   Social History  . Marital Status: Divorced    Spouse Name: N/A  . Number of Children: N/A  . Years of Education: N/A   Occupational History  . retired    Social History Main Topics  . Smoking status: Never Smoker   . Smokeless tobacco: Never Used  . Alcohol Use: No  . Drug Use: No  . Sexual Activity: Not Asked   Other Topics Concern  . None   Social  History Narrative     Family History:   Family History  Problem Relation Age of Onset  . Hyperlipidemia Mother   . Hypertension Mother   . Heart disease Mother   . Hypertension Father   . AAA (abdominal aortic aneurysm) Father   . Heart disease Father   . Hyperlipidemia Father   . Throat cancer Maternal Uncle       ROS:   Please see the history of present illness.   Review of Systems  Cardiovascular: Positive for dyspnea on exertion.  Musculoskeletal: Positive for back pain.  Gastrointestinal: Positive for constipation.  Neurological: Positive for dizziness.  Psychiatric/Behavioral: Positive for depression.  All other systems reviewed and are negative.     PHYSICAL EXAM: VS:  BP 120/70 mmHg  Pulse 69  Ht 6' (1.829 m)  Wt 181 lb 1.9 oz (82.155 kg)  BMI 24.56 kg/m2    Wt Readings from Last 3 Encounters:  04/13/15 181 lb 1.9 oz (82.155 kg)  01/18/15 178 lb (80.74 kg)  09/20/14 193 lb 4 oz (87.658 kg)     GEN: Well nourished, well developed, in no acute distress HEENT: normal Neck: no JVD,  no masses Cardiac:  Normal S1/S2, RRR; no murmur ,  no rubs or gallops, no edema   Respiratory:  clear to auscultation bilaterally, no wheezing, rhonchi or rales. GI: soft, nontender, nondistended, + BS MS: no deformity or atrophy Skin: warm and dry  Neuro:  CNs II-XII intact, Strength and sensation are intact Psych: Normal affect   EKG:  EKG is ordered today.  It demonstrates:   NSR, HR 69, Normal axis, NSSTTW changes, QTc 462 ms, no sig changes   Recent Labs: 09/20/2014: ALT 19; BUN 14.6; Creatinine 1.1; HGB 14.1; Platelets 160; Potassium 4.2; Sodium 140    Lipid Panel    Component Value Date/Time   CHOL 103 08/24/2013 0752   TRIG 58.0 08/24/2013 0752   HDL 28.90* 08/24/2013 6803  CHOLHDL 4 08/24/2013 0752   VLDL 11.6 08/24/2013 0752   LDLCALC 63 08/24/2013 0752      ASSESSMENT AND PLAN:  1. CAD: Status post CABG. Myoview 12/15 low risk and negative for  ischemia. He denies symptoms of angina.  Continue ASA, Plavix, beta-blocker, ACE inhibitor, statin.  Check FU BMET, CBC.    2. Hyperlipidemia: LDL in 3/15 was 63.  Check FU Lipids and LFTs today.   3. AAA: Status post EVAR. Followed by [facility]. Patient has known type II endoleak.  4. Carotid stenosis: Carotid US 3/16 with bilateral 40-59% ICA stenosis. Follow-up due in 3/17.   5. Aortic Insufficiency: Echo in 8/15 with mild AI.   6. HTN:  Controlled.  Check FU BMET.  7. Chronic Diastolic CHF:  NYHA 2b.  Volume stable.  Continue current Rx.       Medication Changes: Current medicines are reviewed at length with the patient today.  Concerns regarding medicines are as outlined above.  The following changes have been made:   Discontinued Medications   No medications on file   Modified Medications   Modified Medication Previous Medication   CLOPIDOGREL (PLAVIX) 75 MG TABLET clopidogrel (PLAVIX) 75 MG tablet      Take 1 tablet (75 mg total) by mouth daily.    TAKE ONE TABLET BY MOUTH ONCE DAILY   New Prescriptions   No medications on file   Labs/ tests ordered today include:   Orders Placed This Encounter  Procedures  . Basic Metabolic Panel (BMET)  . CBC w/Diff  . Hepatic function panel  . Lipid Profile  . EKG 12-Lead     Disposition:    FU with Dr. Loralie Champagne 6 mos.    Signed, Versie Starks, MHS 04/13/2015 9:10 AM    [facility] [address], [name], [place]  [address] Phone: [phone]; Fax: [phone]

## 2015-04-13 ENCOUNTER — Ambulatory Visit (INDEPENDENT_AMBULATORY_CARE_PROVIDER_SITE_OTHER): Payer: Medicare Other | Admitting: Physician Assistant

## 2015-04-13 ENCOUNTER — Encounter: Payer: Self-pay | Admitting: Physician Assistant

## 2015-04-13 VITALS — BP 120/70 | HR 69 | Ht 72.0 in | Wt 181.1 lb

## 2015-04-13 DIAGNOSIS — I10 Essential (primary) hypertension: Secondary | ICD-10-CM

## 2015-04-13 DIAGNOSIS — I714 Abdominal aortic aneurysm, without rupture, unspecified: Secondary | ICD-10-CM

## 2015-04-13 DIAGNOSIS — I5032 Chronic diastolic (congestive) heart failure: Secondary | ICD-10-CM

## 2015-04-13 DIAGNOSIS — I2510 Atherosclerotic heart disease of native coronary artery without angina pectoris: Secondary | ICD-10-CM

## 2015-04-13 DIAGNOSIS — I359 Nonrheumatic aortic valve disorder, unspecified: Secondary | ICD-10-CM

## 2015-04-13 DIAGNOSIS — I6523 Occlusion and stenosis of bilateral carotid arteries: Secondary | ICD-10-CM

## 2015-04-13 DIAGNOSIS — E785 Hyperlipidemia, unspecified: Secondary | ICD-10-CM

## 2015-04-13 LAB — HEPATIC FUNCTION PANEL
ALBUMIN: 4.2 g/dL (ref 3.6–5.1)
ALK PHOS: 50 U/L (ref 40–115)
ALT: 15 U/L (ref 9–46)
AST: 16 U/L (ref 10–35)
Bilirubin, Direct: 0.2 mg/dL (ref <=0.2)
Indirect Bilirubin: 0.5 mg/dL (ref 0.2–1.2)
TOTAL PROTEIN: 8 g/dL (ref 6.1–8.1)
Total Bilirubin: 0.7 mg/dL (ref 0.2–1.2)

## 2015-04-13 LAB — BASIC METABOLIC PANEL
BUN: 17 mg/dL (ref 7–25)
CHLORIDE: 100 mmol/L (ref 98–110)
CO2: 28 mmol/L (ref 20–31)
Calcium: 9.4 mg/dL (ref 8.6–10.3)
Creat: 1.14 mg/dL (ref 0.70–1.18)
Glucose, Bld: 155 mg/dL — ABNORMAL HIGH (ref 65–99)
POTASSIUM: 3.2 mmol/L — AB (ref 3.5–5.3)
SODIUM: 139 mmol/L (ref 135–146)

## 2015-04-13 LAB — LIPID PANEL
Cholesterol: 126 mg/dL (ref 125–200)
HDL: 32 mg/dL — ABNORMAL LOW (ref >=40)
LDL Cholesterol: 77 mg/dL (ref <130)
Total CHOL/HDL Ratio: 3.9 Ratio (ref <=5.0)
Triglycerides: 84 mg/dL (ref <150)
VLDL: 17 mg/dL (ref <30)

## 2015-04-13 LAB — CBC WITH DIFFERENTIAL/PLATELET
Basophils Absolute: 0 10*3/uL (ref 0.0–0.1)
Basophils Relative: 0 % (ref 0–1)
EOS ABS: 0.2 10*3/uL (ref 0.0–0.7)
Eosinophils Relative: 2 % (ref 0–5)
HEMATOCRIT: 41.1 % (ref 39.0–52.0)
HEMOGLOBIN: 13.9 g/dL (ref 13.0–17.0)
LYMPHS ABS: 2.3 10*3/uL (ref 0.7–4.0)
Lymphocytes Relative: 27 % (ref 12–46)
MCH: 32.5 pg (ref 26.0–34.0)
MCHC: 33.8 g/dL (ref 30.0–36.0)
MCV: 96 fL (ref 78.0–100.0)
MONOS PCT: 12 % (ref 3–12)
MPV: 10.5 fL (ref 8.6–12.4)
Monocytes Absolute: 1 10*3/uL (ref 0.1–1.0)
NEUTROS PCT: 59 % (ref 43–77)
Neutro Abs: 5 10*3/uL (ref 1.7–7.7)
PLATELETS: 215 10*3/uL (ref 150–400)
RBC: 4.28 MIL/uL (ref 4.22–5.81)
RDW: 13.4 % (ref 11.5–15.5)
WBC: 8.4 10*3/uL (ref 4.0–10.5)

## 2015-04-13 MED ORDER — CLOPIDOGREL BISULFATE 75 MG PO TABS: 75.0000 mg | ORAL_TABLET | Freq: Every day | ORAL | Status: DC

## 2015-04-13 NOTE — Patient Instructions (Signed)
Medication Instructions:  1. A REFILL FOR PLAVIX   Labwork: TODAY BMET, CBC W/DIFF, LIPID AND LIVER PANEL  Testing/Procedures: Your physician has requested that you have a carotid duplex 08/2015. This test is an ultrasound of the carotid arteries in your neck. It looks at blood flow through these arteries that supply the brain with blood. Allow one hour for this exam. There are no restrictions or special instructions.  Follow-Up: Your physician wants you to follow-up in: 6 MONTHS WITH DR. Aundra Dubin; OUR OFFICE WILL CONTACT YOU 2 MONTHS IN ADVANCED TO MAKE AN APPT.   Any Other Special Instructions Will Be Listed Below (If Applicable).     If you need a refill on your cardiac medications before your next appointment, please call your pharmacy.

## 2015-04-14 ENCOUNTER — Telehealth: Payer: Self-pay | Admitting: *Deleted

## 2015-04-14 DIAGNOSIS — I5032 Chronic diastolic (congestive) heart failure: Secondary | ICD-10-CM

## 2015-04-14 MED ORDER — POTASSIUM CHLORIDE CRYS ER 20 MEQ PO TBCR: 20.0000 meq | EXTENDED_RELEASE_TABLET | Freq: Three times a day (TID) | ORAL | Status: DC

## 2015-04-14 NOTE — Telephone Encounter (Signed)
Pt notified of lab results with verbal understanding to plan of care; increase K+ to 20 meq TID, BMET 11/4.

## 2015-04-21 ENCOUNTER — Other Ambulatory Visit (INDEPENDENT_AMBULATORY_CARE_PROVIDER_SITE_OTHER): Payer: Medicare Other | Admitting: *Deleted

## 2015-04-21 DIAGNOSIS — I5032 Chronic diastolic (congestive) heart failure: Secondary | ICD-10-CM | POA: Diagnosis not present

## 2015-04-21 LAB — BASIC METABOLIC PANEL
BUN: 19 mg/dL (ref 7–25)
CHLORIDE: 101 mmol/L (ref 98–110)
CO2: 27 mmol/L (ref 20–31)
CREATININE: 0.98 mg/dL (ref 0.70–1.18)
Calcium: 8.8 mg/dL (ref 8.6–10.3)
Glucose, Bld: 128 mg/dL — ABNORMAL HIGH (ref 65–99)
POTASSIUM: 3.7 mmol/L (ref 3.5–5.3)
Sodium: 136 mmol/L (ref 135–146)

## 2015-04-24 ENCOUNTER — Telehealth: Payer: Self-pay | Admitting: *Deleted

## 2015-04-24 NOTE — Telephone Encounter (Signed)
Pt notified of lab results by phone with verbal understanding.  

## 2015-07-17 ENCOUNTER — Encounter: Payer: Self-pay | Admitting: Family

## 2015-07-24 ENCOUNTER — Ambulatory Visit (INDEPENDENT_AMBULATORY_CARE_PROVIDER_SITE_OTHER): Payer: Medicare Other | Admitting: Family

## 2015-07-24 ENCOUNTER — Ambulatory Visit (HOSPITAL_COMMUNITY)
Admission: RE | Admit: 2015-07-24 | Discharge: 2015-07-24 | Disposition: A | Discharge status: Home / Self Care | Payer: Medicare Other | Source: Ambulatory Visit | Attending: Family | Admitting: Family

## 2015-07-24 ENCOUNTER — Encounter: Payer: Self-pay | Admitting: Family

## 2015-07-24 VITALS — BP 128/77 | HR 71 | Ht 72.0 in | Wt 189.1 lb

## 2015-07-24 DIAGNOSIS — I714 Abdominal aortic aneurysm, without rupture, unspecified: Secondary | ICD-10-CM

## 2015-07-24 DIAGNOSIS — Z95828 Presence of other vascular implants and grafts: Secondary | ICD-10-CM | POA: Insufficient documentation

## 2015-07-24 DIAGNOSIS — Z48812 Encounter for surgical aftercare following surgery on the circulatory system: Secondary | ICD-10-CM | POA: Diagnosis present

## 2015-07-24 NOTE — Progress Notes (Signed)
[facility] [place]  Established EVAR  History of Present Illness  Mitchell [name] is a 70 y.o. ([birth year]) male patient of Dr. Trula Slade who returns today for followup. 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 He has a known type II endoleak. The patient also has known carotid stenosis which is asymptomatic, this is followed by Dr. Aundra Dubin.  Dr. Trula Slade last saw Mitchell [name] on 07/18/14. At that time by CT scan the aneurysm sac had gotten slightly smaller despite a persistent type II endoleak. Dr. Trula Slade discussed this with the patient and that if his aneurysm starts to increase in size that he would consider embolization. No intervention was recommended at that time.Mitchell [name] returns today for 6 months follow-up with an ultrasound.   Mitchell [name] states his left arm has been weak since his left rotator cuff issue and surgery, and his right arm has been weak since his 2008 CABG x 5 vessels. He had left rotator cuff surgery 09/23/14, finished physical therapy for this. Mitchell [name] states this has been thoroughly evaluated.   Mitchell [name] states his father had a "stomach aneurysm" and his mother had an" aortic aneurysm next to her heart".  States he has had thoracic left ribcage area back pain ever since he had surgery to remove a melanoma, denies any new back pain. He denies abdominal pain.   He is on a statin, Plavix, and ASA.  Mitchell [name] Diabetic: Yes, states his last A1C was 5.9 Mitchell [name] smoker: non-smoker     Past Medical History  Diagnosis Date  . CAD (coronary artery disease)     patient had non-ST-elevation MI in February 2008 and there was an 80-90% proximal LAD stenosis, 90% proximal first diagonal stenosis, 99% ostial ramus stenosis. The first obtuse marginal was subtotally occluded and there was an 80% mid RCA stenosis. EF was estimated 25% on ventriculogram, coronary artery bypass grafting was done with a LIMA to the LAD, sequential saphenous vein graft to the  PDA,  . Aortic insufficiency   . AAA (abdominal aortic aneurysm) ([place])     ultrasound done in 3/11 and showed AAA 3.9 x 3.9cm  . HTN (hypertension)   . DM2 (diabetes mellitus, type 2) ([place])   . Carotid stenosis   . Torn rotator cuff     hx of. left   . History of PFTs     obstructive/restrictive. There is some reversibility with bronchodilator. The patient actually never smoked. He is on Spiriva  . Cancer ([place])     melanoma  . Altered taste   . Pain     back  . COPD (chronic obstructive pulmonary disease) ([place])   . Melanoma ([place]) 09/2010    pT2a N0 M0; clark level IV.   Marland Kitchen Range of motion deficit 09/19/2011  . Myocardial infarction ([place])   . Diabetes mellitus   . Irregular heart beat   . Anxiety   . Depression   . Shortness of breath     with exeration  . CHF (congestive heart failure) [facility])    Past Surgical History  Procedure Laterality Date  . Tonsillectomy  1951  . Arterial bypass surgry    . Melanoma excision    . Blood clots removed    . Application of wound vac    . Coronary artery bypass graft  07/2006  . Tonsillectomy    . Abdominal aortic endovascular stent graft N/A 12/11/2012    Procedure: ABDOMINAL AORTIC ENDOVASCULAR STENT GRAFT GORE;  Surgeon: Butch Penny  Trula Slade, MD;  Location: [facility] OR;  Service: Vascular;  Laterality: N/A;  . Abdominal aortic aneurysm repair    . Shoulder arthroscopy with rotator cuff repair and subacromial decompression Left 09/23/2014    Procedure: SHOULDER ARTHROSCOPY WITH ROTATOR CUFF REPAIR AND SUBACROMIAL DECOMPRESSION DISTAL CLAVICLE RESECTION;  Surgeon: Earlie Server, MD;  Location: [facility];  Service: Orthopedics;  Laterality: Left;   Social History Social History  Substance Use Topics  . Smoking status: Never Smoker   . Smokeless tobacco: Never Used  . Alcohol Use: No   Family History Family History  Problem Relation Age of Onset  . Hyperlipidemia Mother   . Hypertension Mother   . Heart disease Mother   . AAA (abdominal aortic  aneurysm) Mother   . Hypertension Father   . AAA (abdominal aortic aneurysm) Father   . Heart disease Father   . Hyperlipidemia Father   . Throat cancer Maternal Uncle    Current Outpatient Prescriptions on File Prior to Visit  Medication Sig Dispense Refill  . amLODipine (NORVASC) 10 MG tablet Take 10 mg by mouth daily.     Marland Kitchen aspirin 81 MG chewable tablet Chew 81 mg by mouth daily.    Marland Kitchen atorvastatin (LIPITOR) 40 MG tablet Take 40 mg by mouth daily.    . benazepril (LOTENSIN) 40 MG tablet Take 40 mg by mouth daily.     . carvedilol (COREG) 25 MG tablet Take 1 tablet (25 mg total) by mouth 2 (two) times daily.    . chlorthalidone (HYGROTON) 25 MG tablet Take 1 tablet (25 mg total) by mouth daily. 90 tablet 3  . cholecalciferol (VITAMIN D) 1000 UNITS tablet Take 1,000 Units by mouth daily.    . clopidogrel (PLAVIX) 75 MG tablet Take 1 tablet (75 mg total) by mouth daily. 90 tablet 3  . Coenzyme Q10 200 MG capsule Take 200 mg by mouth daily.    [name] Sodium (COLACE PO) Take 1 capsule by mouth 2 (two) times daily as needed (for constipation).     Marland Kitchen glipiZIDE (GLUCOTROL) 10 MG tablet Take 10 mg by mouth daily.     Marland Kitchen HYDROcodone-acetaminophen (NORCO/VICODIN) 5-325 MG per tablet Take 1-2 tablets by mouth every 4 (four) hours as needed for moderate pain.     . metFORMIN (GLUMETZA) 500 MG (MOD) 24 hr tablet Take 500 mg by mouth daily with breakfast.    . Multiple Vitamin (MULTIVITAMIN) tablet Take 1 tablet by mouth daily.      . nortriptyline (PAMELOR) 50 MG capsule Take 50 mg by mouth at bedtime.    . Omega-3 Fatty Acids (FISH OIL TRIPLE STRENGTH) 1400 MG CAPS Take 1,400 mg by mouth every morning.    Marland Kitchen oxyCODONE-acetaminophen (ROXICET) 5-325 MG per tablet Take 1-2 tablets by mouth every 4 (four) hours as needed for severe pain. 75 tablet 0  . potassium chloride SA (K-DUR,KLOR-CON) 20 MEQ tablet Take 1 tablet (20 mEq total) by mouth 3 (three) times daily. 360 tablet 3  . sildenafil (VIAGRA) 50  MG tablet Take 50 mg by mouth daily as needed for erectile dysfunction.    Marland Kitchen tiotropium (SPIRIVA) 18 MCG inhalation capsule Place 18 mcg into inhaler and inhale daily.      . traZODone (DESYREL) 100 MG tablet Take 100 mg by mouth at bedtime.      No current facility-administered medications on file prior to visit.   Allergies  Allergen Reactions  . Zocor [Simvastatin] Other (See Comments)    Bone Pain  ROS: See HPI for pertinent positives and negatives.  Physical Examination  Filed Vitals:   07/24/15 0848  BP: 128/77  Pulse: 71  Height: 6' (1.829 m)  Weight: 189 lb 1.6 oz (85.775 kg)  SpO2: 97%   Body mass index is 25.64 kg/(m^2).  General: A&O x 3, WD  Pulmonary: Sym exp, good air movt, CTAB, no rales, rhonchi, or wheezing.   Cardiac: RRR, Nl S1, S2, no murmur appreciated  Vascular: Vessel Right Left  Radial 1+Palpable Not Palpable  Brachial 2+Palpable 2+Palpable  Carotid audible without bruit audible without bruit  Aorta Not palpable N/A  Femoral Not Palpable 1+Palpable  Popliteal Not palpable Not palpable  Mitchell [name] 2+Palpable 1+Palpable  DP Faintly Palpable 1+Palpable   Gastrointestinal: soft, NTND, -G/R, - HSM, - palpable masses, - CVAT B.  Musculoskeletal: M/S 5/5 in both lower extremities, 3/5 in upper extremities, extremities without ischemic changes.  Neurologic: Pain and light touch intact in extremities, Motor exam as listed above       Non-Invasive Vascular Imaging  EVAR Duplex (Date: 07/24/2015) ABDOMINAL AORTA DUPLEX EVALUATION - POST ENDOVASCULAR REPAIR    INDICATION: Follow up endovascular repair    PREVIOUS INTERVENTION(S): EVAR 12/11/2012 with type 11 endoleak 07/18/2014 by Dr. Trula Slade    DUPLEX EXAM:      DIAMETER AP (cm) DIAMETER TRANSVERSE (cm) VELOCITIES (cm/sec)  Aorta 4.69 4.33 115  Right Common Iliac 1.4 1.4 105  Left Common Iliac 1.7 1.4 110    Comparison Study       Date DIAMETER AP (cm) DIAMETER  TRANSVERSE (cm)  01/18/2015 4.7   CT 07/18/2014 5.1 -     ADDITIONAL FINDINGS:     IMPRESSION: 1. Technically difficult exam due to excessive bowel gas. 2. The sac size measures 4.7 cm AP.    Compared to the previous exam:  No significant changes compared to previous exam of 01/18/2015.      Medical Decision Making   Tarius Conary Sittler is a 70 y.o. male who is status post cutaneous endovascular abdominal aortic aneurysm repair on 12/11/2012. He has a known type II endoleak.  Today's abdominal duplex suggests a technically difficult exam due to excessive bowel gas. The sac size measures 4.7 cm AP.              No significant changes compared to previous exam of 01/18/2015.   The Mitchell [name] asked about MRI compatibility with his endograft stent. I spoke with Dr. Trula Slade about this and Dr. Lissa Hoard said that all of the newer, this Mitchell [name]'s included, endograft stents are MRI compatible.   I discussed with the patient the importance of surveillance of the endograft.  The next endograft duplex will be scheduled for 6 months.  The patient will follow up with [place] in 6 months with these studies.  I emphasized the importance of maximal medical management including strict control of blood pressure, blood glucose, and lipid levels, antiplatelet agents, obtaining regular exercise, and cessation of smoking.   Thank you for allowing [place] to participate in this patient's care.  Clemon Chambers, RN, MSN, FNP-C [facility] [place] [facility] Office: [facility] Physician: Trula Slade  07/24/2015, 8:58 AM

## 2015-08-28 ENCOUNTER — Ambulatory Visit (HOSPITAL_COMMUNITY)
Admission: RE | Admit: 2015-08-28 | Discharge: 2015-08-28 | Disposition: A | Discharge status: Home / Self Care | Payer: Medicare Other | Source: Ambulatory Visit | Attending: Physician Assistant | Admitting: Physician Assistant

## 2015-08-28 DIAGNOSIS — I509 Heart failure, unspecified: Secondary | ICD-10-CM | POA: Insufficient documentation

## 2015-08-28 DIAGNOSIS — I110 Hypertensive heart disease with heart failure: Secondary | ICD-10-CM | POA: Insufficient documentation

## 2015-08-28 DIAGNOSIS — I6523 Occlusion and stenosis of bilateral carotid arteries: Secondary | ICD-10-CM | POA: Diagnosis not present

## 2015-08-28 DIAGNOSIS — E119 Type 2 diabetes mellitus without complications: Secondary | ICD-10-CM | POA: Insufficient documentation

## 2015-08-29 ENCOUNTER — Encounter: Payer: Self-pay | Admitting: Physician Assistant

## 2015-08-31 ENCOUNTER — Telehealth: Payer: Self-pay | Admitting: *Deleted

## 2015-08-31 DIAGNOSIS — I6523 Occlusion and stenosis of bilateral carotid arteries: Secondary | ICD-10-CM

## 2015-08-31 DIAGNOSIS — I5032 Chronic diastolic (congestive) heart failure: Secondary | ICD-10-CM

## 2015-08-31 NOTE — Telephone Encounter (Signed)
Follow up ° ° ° ° ° °Returning a call to the nurse °

## 2015-08-31 NOTE — Telephone Encounter (Signed)
Pt notified of carotid results and findings by phone with verbal understanding. Pt agreeable to repeat carotids in 1 yr. Order placed in epic today.

## 2015-08-31 NOTE — Telephone Encounter (Signed)
Lmptcb for carotid results

## 2015-10-23 ENCOUNTER — Ambulatory Visit (HOSPITAL_BASED_OUTPATIENT_CLINIC_OR_DEPARTMENT_OTHER): Payer: Medicare Other | Admitting: Hematology and Oncology

## 2015-10-23 ENCOUNTER — Telehealth: Payer: Self-pay | Admitting: *Deleted

## 2015-10-23 ENCOUNTER — Other Ambulatory Visit (HOSPITAL_BASED_OUTPATIENT_CLINIC_OR_DEPARTMENT_OTHER): Payer: Medicare Other

## 2015-10-23 VITALS — BP 111/62 | HR 65 | Temp 98.2°F | Resp 18 | Ht 72.0 in | Wt 195.1 lb

## 2015-10-23 DIAGNOSIS — Z85820 Personal history of malignant melanoma of skin: Secondary | ICD-10-CM

## 2015-10-23 DIAGNOSIS — N179 Acute kidney failure, unspecified: Secondary | ICD-10-CM | POA: Diagnosis not present

## 2015-10-23 DIAGNOSIS — S46002D Unspecified injury of muscle(s) and tendon(s) of the rotator cuff of left shoulder, subsequent encounter: Secondary | ICD-10-CM | POA: Diagnosis not present

## 2015-10-23 DIAGNOSIS — C439 Malignant melanoma of skin, unspecified: Secondary | ICD-10-CM

## 2015-10-23 LAB — COMPREHENSIVE METABOLIC PANEL
ALBUMIN: 3.7 g/dL (ref 3.5–5.0)
ALK PHOS: 44 U/L (ref 40–150)
ALT: 22 U/L (ref 0–55)
ANION GAP: 7 meq/L (ref 3–11)
AST: 20 U/L (ref 5–34)
BUN: 19.6 mg/dL (ref 7.0–26.0)
CALCIUM: 9.3 mg/dL (ref 8.4–10.4)
CHLORIDE: 103 meq/L (ref 98–109)
CO2: 27 mEq/L (ref 22–29)
CREATININE: 1.4 mg/dL — AB (ref 0.7–1.3)
EGFR: 50 mL/min/{1.73_m2} — ABNORMAL LOW (ref >90)
Glucose: 216 mg/dl — ABNORMAL HIGH (ref 70–140)
POTASSIUM: 4.3 meq/L (ref 3.5–5.1)
Sodium: 138 mEq/L (ref 136–145)
Total Bilirubin: 0.39 mg/dL (ref 0.20–1.20)
Total Protein: 7.7 g/dL (ref 6.4–8.3)

## 2015-10-23 LAB — CBC WITH DIFFERENTIAL/PLATELET
BASO%: 0.8 % (ref 0.0–2.0)
BASOS ABS: 0.1 10*3/uL (ref 0.0–0.1)
EOS ABS: 0.2 10*3/uL (ref 0.0–0.5)
EOS%: 2.5 % (ref 0.0–7.0)
HEMATOCRIT: 42.8 % (ref 38.4–49.9)
HGB: 14.1 g/dL (ref 13.0–17.1)
LYMPH#: 1.7 10*3/uL (ref 0.9–3.3)
LYMPH%: 27.9 % (ref 14.0–49.0)
MCH: 31.6 pg (ref 27.2–33.4)
MCHC: 32.8 g/dL (ref 32.0–36.0)
MCV: 96.4 fL (ref 79.3–98.0)
MONO#: 0.5 10*3/uL (ref 0.1–0.9)
MONO%: 8.7 % (ref 0.0–14.0)
NEUT#: 3.6 10*3/uL (ref 1.5–6.5)
NEUT%: 60.1 % (ref 39.0–75.0)
PLATELETS: 176 10*3/uL (ref 140–400)
RBC: 4.44 10*6/uL (ref 4.20–5.82)
RDW: 13.7 % (ref 11.0–14.6)
WBC: 6 10*3/uL (ref 4.0–10.3)

## 2015-10-23 LAB — LACTATE DEHYDROGENASE: LDH: 146 U/L (ref 125–245)

## 2015-10-23 NOTE — Telephone Encounter (Signed)
Informed pt of elevated blood sugar and creatinine and need to f/u w/ PCP.  Will fax lab results to Dr. Arelia Sneddon and instructed pt to call his office to ask when Dr. Arelia Sneddon would like pt to have labs rechecked.  Pt verbalized understanding.  Labs faxed to Dr. Arelia Sneddon.

## 2015-10-23 NOTE — Progress Notes (Signed)
[facility] OFFICE PROGRESS NOTE  Patient Care Team: Leonard Downing, MD as PCP - General (Family Medicine) Danella Sensing, MD (Dermatology) Larey Dresser, MD (Cardiology) Carolan Clines, MD as Attending Physician (Urology)  SUMMARY OF ONCOLOGIC HISTORY:    Primary site: Melanoma of the Skin   Staging method: AJCC 7th Edition   Clinical: Stage IB (T2a, N0, M0) signed by Heath Lark, MD on 09/17/2013  8:46 PM   Summary: Stage IB (T2a, N0, M0)  HISTORY OF PRESENTING ILLNESS:  Mitchell [name] was transferred to my care after his prior physician has left. His cancer was diagnosed after his PCP found an abnormal mole. The pathology indicated that it was Clark level IV, Breslow thickness 1.75 mm with mitotic figures 20/mm sq without ulceration, without lymphatic invasion, without perineural invasion, without microscopic satellitosis. He underwent wide local excision on 10/22/2010 followed by sentinel node biopsy without residual cancer or positive nodes.  He was observed.   The patient had excessive exposure of sun as a child. There is positive family history of skin cancer in his uncle but not melanoma. He is vigilant about skin protection and follows with his dermatologist regularly. He denies recent diagnosis of other skin cancer. Denies any new lymphadenopathy.  INTERVAL HISTORY: Please see below for problem oriented charting. He denies new skin lesions. He is vigilant with skin protection. He had shoulder surgery last year but continued to have persistent left shoulder discomfort.  REVIEW OF SYSTEMS:   Constitutional: Denies fevers, chills or abnormal weight loss Eyes: Denies blurriness of vision Ears, nose, mouth, throat, and face: Denies mucositis or sore throat Respiratory: Denies cough, dyspnea or wheezes Cardiovascular: Denies palpitation, chest discomfort or lower extremity swelling Gastrointestinal:  Denies nausea, heartburn or change in bowel habits Skin:  Denies abnormal skin rashes Lymphatics: Denies new lymphadenopathy or easy bruising Neurological:Denies numbness, tingling or new weaknesses Behavioral/Psych: Mood is stable, no new changes  All other systems were reviewed with the patient and are negative.  I have reviewed the past medical history, past surgical history, social history and family history with the patient and they are unchanged from previous note.  ALLERGIES:  is allergic to zocor.  MEDICATIONS:  Current Outpatient Prescriptions  Medication Sig Dispense Refill  . acetaminophen-codeine (TYLENOL #3) 300-30 MG tablet Take 1 tablet by mouth every 4 (four) hours as needed.    Marland Kitchen amLODipine (NORVASC) 10 MG tablet Take 10 mg by mouth daily.     Marland Kitchen aspirin 81 MG chewable tablet Chew 81 mg by mouth daily.    Marland Kitchen atorvastatin (LIPITOR) 40 MG tablet Take 40 mg by mouth daily.    . benazepril (LOTENSIN) 40 MG tablet Take 40 mg by mouth daily.     . carvedilol (COREG) 25 MG tablet Take 1 tablet (25 mg total) by mouth 2 (two) times daily.    . chlorthalidone (HYGROTON) 25 MG tablet Take 1 tablet (25 mg total) by mouth daily. 90 tablet 3  . cholecalciferol (VITAMIN D) 1000 UNITS tablet Take 1,000 Units by mouth daily.    . clopidogrel (PLAVIX) 75 MG tablet Take 1 tablet (75 mg total) by mouth daily. 90 tablet 3  . Coenzyme Q10 200 MG capsule Take 200 mg by mouth daily.    [name] Sodium (COLACE PO) Take 1 capsule by mouth 2 (two) times daily as needed (for constipation).     . GARLIC PO Take by mouth daily.    Marland Kitchen glipiZIDE (GLUCOTROL) 10 MG tablet  Take 10 mg by mouth daily.     . metFORMIN (GLUMETZA) 500 MG (MOD) 24 hr tablet Take 500 mg by mouth daily with breakfast.    . Multiple Vitamin (MULTIVITAMIN) tablet Take 1 tablet by mouth daily.      . nortriptyline (PAMELOR) 50 MG capsule Take 50 mg by mouth at bedtime.    . Omega-3 Fatty Acids (FISH OIL TRIPLE STRENGTH) 1400 MG CAPS Take 1,400 mg by mouth every morning.    . potassium  chloride SA (K-DUR,KLOR-CON) 20 MEQ tablet Take 1 tablet (20 mEq total) by mouth 3 (three) times daily. 360 tablet 3  . sildenafil (VIAGRA) 50 MG tablet Take 50 mg by mouth daily as needed for erectile dysfunction.    Marland Kitchen tiotropium (SPIRIVA) 18 MCG inhalation capsule Place 18 mcg into inhaler and inhale daily.      . traZODone (DESYREL) 100 MG tablet Take 100 mg by mouth at bedtime.      No current facility-administered medications for this visit.    PHYSICAL EXAMINATION: ECOG PERFORMANCE STATUS: 0 - Asymptomatic  Filed Vitals:   10/23/15 1232  BP: 111/62  Pulse: 65  Temp: 98.2 F (36.8 C)  Resp: 18   Filed Weights   10/23/15 1232  Weight: 195 lb 1.6 oz (88.497 kg)    GENERAL:alert, no distress and comfortable SKIN: skin color, texture, turgor are normal, no rashes or significant lesions. He has no suspicious skin lesion EYES: normal, Conjunctiva are pink and non-injected, sclera clear OROPHARYNX:no exudate, no erythema and lips, buccal mucosa, and tongue normal  NECK: supple, thyroid normal size, non-tender, without nodularity LYMPH:  no palpable lymphadenopathy in the cervical, axillary or inguinal LUNGS: clear to auscultation and percussion with normal breathing effort HEART: regular rate & rhythm and no murmurs and no lower extremity edema ABDOMEN:abdomen soft, non-tender and normal bowel sounds Musculoskeletal:no cyanosis of digits and no clubbing  NEURO: alert & oriented x 3 with fluent speech, no focal motor/sensory deficits  LABORATORY DATA:  I have reviewed the data as listed    Component Value Date/Time   NA 138 10/23/2015 1224   NA 136 04/21/2015 1127   K 4.3 10/23/2015 1224   K 3.7 04/21/2015 1127   CL 101 04/21/2015 1127   CL 101 09/18/2012 1428   CO2 27 10/23/2015 1224   CO2 27 04/21/2015 1127   GLUCOSE 216* 10/23/2015 1224   GLUCOSE 128* 04/21/2015 1127   GLUCOSE 105* 09/18/2012 1428   BUN 19.6 10/23/2015 1224   BUN 19 04/21/2015 1127   CREATININE  1.4* 10/23/2015 1224   CREATININE 0.98 04/21/2015 1127   CREATININE 1.2 08/24/2013 0752   CALCIUM 9.3 10/23/2015 1224   CALCIUM 8.8 04/21/2015 1127   PROT 7.7 10/23/2015 1224   PROT 8.0 04/13/2015 0917   ALBUMIN 3.7 10/23/2015 1224   ALBUMIN 4.2 04/13/2015 0917   AST 20 10/23/2015 1224   AST 16 04/13/2015 0917   ALT 22 10/23/2015 1224   ALT 15 04/13/2015 0917   ALKPHOS 44 10/23/2015 1224   ALKPHOS 50 04/13/2015 0917   BILITOT 0.39 10/23/2015 1224   BILITOT 0.7 04/13/2015 0917   GFRNONAA 75* 12/12/2012 0528   GFRAA 87* 12/12/2012 0528    No results found for: SPEP, UPEP  Lab Results  Component Value Date   WBC 6.0 10/23/2015   NEUTROABS 3.6 10/23/2015   HGB 14.1 10/23/2015   HCT 42.8 10/23/2015   MCV 96.4 10/23/2015   PLT 176 10/23/2015  Chemistry      Component Value Date/Time   NA 138 10/23/2015 1224   NA 136 04/21/2015 1127   K 4.3 10/23/2015 1224   K 3.7 04/21/2015 1127   CL 101 04/21/2015 1127   CL 101 09/18/2012 1428   CO2 27 10/23/2015 1224   CO2 27 04/21/2015 1127   BUN 19.6 10/23/2015 1224   BUN 19 04/21/2015 1127   CREATININE 1.4* 10/23/2015 1224   CREATININE 0.98 04/21/2015 1127   CREATININE 1.2 08/24/2013 0752      Component Value Date/Time   CALCIUM 9.3 10/23/2015 1224   CALCIUM 8.8 04/21/2015 1127   ALKPHOS 44 10/23/2015 1224   ALKPHOS 50 04/13/2015 0917   AST 20 10/23/2015 1224   AST 16 04/13/2015 0917   ALT 22 10/23/2015 1224   ALT 15 04/13/2015 0917   BILITOT 0.39 10/23/2015 1224   BILITOT 0.7 04/13/2015 0917     ASSESSMENT & PLAN:  History of melanoma Clinically, he has no signs of disease recurrence. He had CT imaging study done in 2016 which showed no evidence of metastatic disease. After 5 years, I will discharge him from the clinic. I recommend he continues close follow-up with dermatologist. The patient is very vigilant about skin protection.  Injury of tendon of left rotator cuff He has chronic pain from rotator cuff  injury on the left shoulder  Recommend continue PT   Acute prerenal failure ([place]) He has evidence of mild recent renal failure. I recommend increase hydration and follow-up with primary care doctor for close follow-up and monitoring of renal function.    All questions were answered. The patient knows to call the clinic with any problems, questions or concerns. No barriers to learning was detected. I spent 15 minutes counseling the patient face to face. The total time spent in the appointment was 20 minutes and more than 50% was on counseling and review of test results     [facility], [place], MD 10/23/2015 4:38 PM

## 2015-10-23 NOTE — Telephone Encounter (Signed)
-----   Message from Heath Lark, MD sent at 10/23/2015  1:23 PM EDT ----- Regarding: mildly high blood sugar and elevated creatinine PLs let him know blood sugar and creatinine a bit up Recommend follow with PCP to follow & consider medication adjustment and increase water intake ----- Message -----    From: [facility] Interface    Sent: 10/23/2015  12:33 PM      To: Heath Lark, MD

## 2015-10-24 ENCOUNTER — Encounter: Payer: Self-pay | Admitting: Hematology and Oncology

## 2015-10-24 DIAGNOSIS — N179 Acute kidney failure, unspecified: Secondary | ICD-10-CM | POA: Insufficient documentation

## 2015-10-24 NOTE — Assessment & Plan Note (Signed)
Clinically, he has no signs of disease recurrence. He had CT imaging study done in 2016 which showed no evidence of metastatic disease. After 5 years, I will discharge him from the clinic. I recommend he continues close follow-up with dermatologist. The patient is very vigilant about skin protection.

## 2015-10-24 NOTE — Assessment & Plan Note (Signed)
He has chronic pain from rotator cuff injury on the left shoulder  Recommend continue PT

## 2015-10-24 NOTE — Assessment & Plan Note (Signed)
He has evidence of mild recent renal failure. I recommend increase hydration and follow-up with primary care doctor for close follow-up and monitoring of renal function.

## 2016-01-25 ENCOUNTER — Encounter: Payer: Self-pay | Admitting: Family

## 2016-01-29 ENCOUNTER — Encounter: Payer: Self-pay | Admitting: Family

## 2016-01-29 ENCOUNTER — Ambulatory Visit (HOSPITAL_COMMUNITY)
Admission: RE | Admit: 2016-01-29 | Discharge: 2016-01-29 | Disposition: A | Discharge status: Home / Self Care | Payer: Medicare Other | Source: Ambulatory Visit | Attending: Family | Admitting: Family

## 2016-01-29 ENCOUNTER — Other Ambulatory Visit: Payer: Self-pay | Admitting: *Deleted

## 2016-01-29 ENCOUNTER — Ambulatory Visit (INDEPENDENT_AMBULATORY_CARE_PROVIDER_SITE_OTHER): Payer: Medicare Other | Admitting: Family

## 2016-01-29 VITALS — BP 126/74 | HR 58 | Resp 18 | Ht 72.0 in | Wt 194.6 lb

## 2016-01-29 DIAGNOSIS — I110 Hypertensive heart disease with heart failure: Secondary | ICD-10-CM | POA: Diagnosis not present

## 2016-01-29 DIAGNOSIS — I6523 Occlusion and stenosis of bilateral carotid arteries: Secondary | ICD-10-CM | POA: Diagnosis not present

## 2016-01-29 DIAGNOSIS — Z95828 Presence of other vascular implants and grafts: Secondary | ICD-10-CM

## 2016-01-29 DIAGNOSIS — F419 Anxiety disorder, unspecified: Secondary | ICD-10-CM | POA: Diagnosis not present

## 2016-01-29 DIAGNOSIS — I714 Abdominal aortic aneurysm, without rupture, unspecified: Secondary | ICD-10-CM

## 2016-01-29 DIAGNOSIS — F329 Major depressive disorder, single episode, unspecified: Secondary | ICD-10-CM | POA: Diagnosis not present

## 2016-01-29 DIAGNOSIS — E119 Type 2 diabetes mellitus without complications: Secondary | ICD-10-CM | POA: Diagnosis not present

## 2016-01-29 DIAGNOSIS — I509 Heart failure, unspecified: Secondary | ICD-10-CM | POA: Diagnosis not present

## 2016-01-29 DIAGNOSIS — J449 Chronic obstructive pulmonary disease, unspecified: Secondary | ICD-10-CM | POA: Diagnosis not present

## 2016-01-29 DIAGNOSIS — I2510 Atherosclerotic heart disease of native coronary artery without angina pectoris: Secondary | ICD-10-CM | POA: Insufficient documentation

## 2016-01-29 NOTE — Progress Notes (Signed)
[facility]  CC: Follow up s/p EVAR  History of Present Illness  Mitchell [name] is a 70 y.o. (1946-06-03) male patient of Dr. Trula Slade who returns today for followup. 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 He has a known type II endoleak. The patient also has known carotid stenosis which is asymptomatic, this is followed by Dr. Aundra Dubin.  Dr. Trula Slade last saw pt on 07/18/14. At that time by CT scan the aneurysm sac had gotten slightly smaller despite a persistent type II endoleak. Dr. Trula Slade discussed this with the patient and that if his aneurysm starts to increase in size that he would consider embolization. No intervention was recommended at that time.  Pt states his left arm has been weak since his left rotator cuff issue and surgery, and his right arm has been weak since his 2008 CABG x 5 vessels. He had left rotator cuff surgery 09/23/14, finished physical therapy for this. Pt states this has been thoroughly evaluated.   Pt states his father had a "stomach aneurysm" and his mother had an" aortic aneurysm next to her heart".  States he has had thoracic left ribcage area back pain ever since he had surgery to remove a melanoma, denies any new back pain. He states antibiotics resolved right groin to scrotum pain, but the pain returned 4 months later; this is under evaluation by his urologist, Dr. Gaynelle Arabian.   He is on a statin, Plavix, and ASA.  Pt Diabetic: Yes, states his last A1C was 6.2 at the [place] in July 2017. Pt smoker: non-smoker     Past Medical History:  Diagnosis Date  . AAA (abdominal aortic aneurysm) ([place])    ultrasound done in 3/11 and showed AAA 3.9 x 3.9cm  . Altered taste   . Anxiety   . Aortic insufficiency   . CAD (coronary artery disease)    patient had non-ST-elevation MI in February 2008 and there was an 80-90% proximal LAD stenosis, 90% proximal first diagonal stenosis,  99% ostial ramus stenosis. The first obtuse marginal was subtotally occluded and there was an 80% mid RCA stenosis. EF was estimated 25% on ventriculogram, coronary artery bypass grafting was done with a LIMA to the LAD, sequential saphenous vein graft to the PDA,  . Cancer ([place])    melanoma  . Carotid stenosis    Carotid US 3/17: bilat ICA 40-59% >> FU 1 year  . CHF (congestive heart failure) ([place])   . COPD (chronic obstructive pulmonary disease) ([place])   . Depression   . Diabetes mellitus   . DM2 (diabetes mellitus, type 2) ([place])   . History of PFTs    obstructive/restrictive. There is some reversibility with bronchodilator. The patient actually never smoked. He is on Spiriva  . HTN (hypertension)   . Irregular heart beat   . Melanoma ([place]) 09/2010   pT2a N0 M0; clark level IV.   Marland Kitchen Myocardial infarction ([place])   . Pain    back  . Range of motion deficit 09/19/2011  . Shortness of breath    with exeration  . Torn rotator cuff    hx of. left    Past Surgical History:  Procedure Laterality Date  . ABDOMINAL AORTIC ANEURYSM REPAIR    . ABDOMINAL AORTIC ENDOVASCULAR STENT GRAFT N/A 12/11/2012   Procedure: ABDOMINAL AORTIC ENDOVASCULAR STENT GRAFT GORE;  Surgeon: Serafina Mitchell, MD;  Location: [facility];  Service: Vascular;  Laterality: N/A;  . APPLICATION  OF WOUND VAC    . ARTERIAL BYPASS SURGRY    . blood clots removed    . CORONARY ARTERY BYPASS GRAFT  07/2006  . MELANOMA EXCISION    . SHOULDER ARTHROSCOPY WITH ROTATOR CUFF REPAIR AND SUBACROMIAL DECOMPRESSION Left 09/23/2014   Procedure: SHOULDER ARTHROSCOPY WITH ROTATOR CUFF REPAIR AND SUBACROMIAL DECOMPRESSION DISTAL CLAVICLE RESECTION;  Surgeon: Earlie Server, MD;  Location: [facility];  Service: Orthopedics;  Laterality: Left;  . TONSILLECTOMY  1951  . TONSILLECTOMY     Social History Social History  Substance Use Topics  . Smoking status: Never Smoker  . Smokeless tobacco: Never Used  . Alcohol use No   Family History Family  History  Problem Relation Age of Onset  . Hyperlipidemia Mother   . Hypertension Mother   . Heart disease Mother   . AAA (abdominal aortic aneurysm) Mother   . Hypertension Father   . AAA (abdominal aortic aneurysm) Father   . Heart disease Father   . Hyperlipidemia Father   . Throat cancer Maternal Uncle    Current Outpatient Prescriptions on File Prior to Visit  Medication Sig Dispense Refill  . acetaminophen-codeine (TYLENOL #3) 300-30 MG tablet Take 1 tablet by mouth every 4 (four) hours as needed.    Marland Kitchen amLODipine (NORVASC) 10 MG tablet Take 10 mg by mouth daily.     Marland Kitchen aspirin 81 MG chewable tablet Chew 81 mg by mouth daily.    Marland Kitchen atorvastatin (LIPITOR) 40 MG tablet Take 40 mg by mouth daily.    . benazepril (LOTENSIN) 40 MG tablet Take 40 mg by mouth daily.     . carvedilol (COREG) 25 MG tablet Take 1 tablet (25 mg total) by mouth 2 (two) times daily.    . chlorthalidone (HYGROTON) 25 MG tablet Take 1 tablet (25 mg total) by mouth daily. 90 tablet 3  . cholecalciferol (VITAMIN D) 1000 UNITS tablet Take 1,000 Units by mouth daily.    . clopidogrel (PLAVIX) 75 MG tablet Take 1 tablet (75 mg total) by mouth daily. 90 tablet 3  . Coenzyme Q10 200 MG capsule Take 200 mg by mouth daily.    [name] Sodium (COLACE PO) Take 1 capsule by mouth 2 (two) times daily as needed (for constipation).     . GARLIC PO Take by mouth daily.    Marland Kitchen glipiZIDE (GLUCOTROL) 10 MG tablet Take 10 mg by mouth daily.     . metFORMIN (GLUMETZA) 500 MG (MOD) 24 hr tablet Take 500 mg by mouth daily with breakfast.    . Multiple Vitamin (MULTIVITAMIN) tablet Take 1 tablet by mouth daily.      . nortriptyline (PAMELOR) 50 MG capsule Take 50 mg by mouth at bedtime.    . Omega-3 Fatty Acids (FISH OIL TRIPLE STRENGTH) 1400 MG CAPS Take 1,400 mg by mouth every morning.    . potassium chloride SA (K-DUR,KLOR-CON) 20 MEQ tablet Take 1 tablet (20 mEq total) by mouth 3 (three) times daily. 360 tablet 3  . sildenafil  (VIAGRA) 50 MG tablet Take 100 mg by mouth daily as needed for erectile dysfunction.     Marland Kitchen tiotropium (SPIRIVA) 18 MCG inhalation capsule Place 18 mcg into inhaler and inhale daily.      . traZODone (DESYREL) 100 MG tablet Take 100 mg by mouth at bedtime.      No current facility-administered medications on file prior to visit.    Allergies  Allergen Reactions  . Zocor [Simvastatin] Other (See Comments)  Bone Pain     ROS: See HPI for pertinent positives and negatives.  Physical Examination  Vitals:   01/29/16 0849  BP: 126/74  Pulse: (!) 58  Resp: 18  SpO2: 99%  Weight: 194 lb 9.6 oz (88.3 kg)  Height: 6' (1.829 m)   Body mass index is 26.39 kg/m.  General: A&O x 3, WD  Pulmonary: Sym exp, respirations are non labored, good air movt, CTAB, no rales, rhonchi, or wheezing.   Cardiac: RRR, Nl S1, S2, no murmur appreciated  Vascular: Vessel Right Left  Radial 1+Palpable Not Palpable  Brachial 2+Palpable 2+Palpable  Carotid audible without bruit audible without bruit  Aorta Not palpable N/A  Femoral 2+ Palpable Faintly Palpable  Popliteal 1+ palpable Not palpable  PT 2+Palpable 2+Palpable  DP not Palpable Not Palpable   Gastrointestinal: soft, NTND, -G/R, - HSM, - palpable masses, - CVAT B.  Musculoskeletal: M/S 5/5 in both lower extremities, 3/5 in upper extremities, extremities without ischemic changes.  Neurologic: Pain and light touch intact in extremities, Motor exam as listed above  CTA Abd/Pelvis Duplex (Date: 07/18/2014) Status post infrarenal abdominal aortic aneurysm repair with endograft placement. Limbs remain patent with no evidence of migration. 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 The degree of contrast within the excluded aneurysm sac appears less than the previous CT, and the greatest diameter of the aneurysm measures less than the comparison - previously 5.3  cm, currently 5.1 cm.    Non-Invasive Vascular Imaging  EVAR Duplex (Date: 01/29/16) ABDOMINAL AORTA DUPLEX EVALUATION - POST ENDOVASCULAR REPAIR    INDICATION: Stent repair of abdominal aortic aneurysm     PREVIOUS INTERVENTION(S): Stent repair of abdominal aortic aneurysm on 12/11/12.  Type II endoleak by CT since one month post procedure.    DUPLEX EXAM:      DIAMETER AP (cm) DIAMETER TRANSVERSE (cm) VELOCITIES (cm/sec)  Aorta 4.29 5 102  Right Common Iliac 1.54 1.46 80  Left Common Iliac 1.7 1.65 99    Comparison Study       Date DIAMETER AP (cm) DIAMETER TRANSVERSE (cm)  07/24/15 4.7 4.3     ADDITIONAL FINDINGS: . Tortuosity of the distal aorta/proximal common iliac arteries.    IMPRESSION: 1.  Patent stent of the abdominal aorta with a maximum diameter measurement of  5cm. Measurement may be skewed due to tortuosity. 2. Endoleak not visualized.    Compared to the previous exam:  Decrease in the AP measurement of the distal aorta.  Increase in the transverse measurement.  Measurements may be skewed due to vessel tortuosity.        Medical Decision Making symptomatic  Mitchell [name] is a 70 y.o. male who presents s/p EVAR (Date: 12/11/2012).  He has intermittent right groin and scrotal pain that is not c/w symptoms from an EVAR in place. Today's EVAR duplex suggests a patent stent of the abdominal aorta with a maximum diameter measurement of  5cm. Measurement may be skewed due to tortuosity. Endoleak not visualized. Decrease in the AP measurement of the distal aorta.  Increase in the transverse measurement.  Measurements may be skewed due to vessel tortuosity.  I discussed with Dr. Trula Slade the results of today's EVAR duplex.  I discussed with the patient the importance of surveillance of the endograft.  The next CTA will be scheduled within the next 2-3 weeks.  The patient will follow up with Dr. Trula Slade in 2-3 weeks with these studies.  Creatinine in May 2017  was 1.4.  I emphasized the importance of maximal medical management including strict control of blood pressure, blood glucose, and lipid levels, antiplatelet agents, obtaining regular exercise, and continued cessation of smoking.   Thank you for allowing [place] to participate in this patient's care.  Clemon Chambers, RN, MSN, FNP-C [facility] Office: [facility] Physician: Trula Slade  01/29/2016, 8:58 AM

## 2016-02-07 ENCOUNTER — Encounter: Payer: Self-pay | Admitting: Surgery

## 2016-02-12 ENCOUNTER — Ambulatory Visit (INDEPENDENT_AMBULATORY_CARE_PROVIDER_SITE_OTHER): Payer: Medicare Other | Admitting: Surgery

## 2016-02-12 ENCOUNTER — Ambulatory Visit
Admission: RE | Admit: 2016-02-12 | Discharge: 2016-02-12 | Disposition: A | Discharge status: Home / Self Care | Payer: Medicare Other | Source: Ambulatory Visit | Attending: Family | Admitting: Family

## 2016-02-12 ENCOUNTER — Encounter: Payer: Self-pay | Admitting: Surgery

## 2016-02-12 VITALS — BP 111/71 | HR 62 | Temp 97.3°F | Resp 16 | Ht 72.0 in | Wt 194.0 lb

## 2016-02-12 DIAGNOSIS — I714 Abdominal aortic aneurysm, without rupture, unspecified: Secondary | ICD-10-CM

## 2016-02-12 DIAGNOSIS — I6523 Occlusion and stenosis of bilateral carotid arteries: Secondary | ICD-10-CM | POA: Diagnosis not present

## 2016-02-12 MED ORDER — IOPAMIDOL (ISOVUE-370) INJECTION 76%
100.0000 mL | Freq: Once | INTRAVENOUS | Status: AC | PRN
  Administered 2016-02-12: 100 mL via INTRAVENOUS

## 2016-02-12 NOTE — Progress Notes (Signed)
[facility] [place]  Patient name: Mitchell [name] MRN: [id number] DOB: 1945/09/09 Sex: male  REASON FOR VISIT: follow up AAA  HPI: Mitchell [name] is a 70 y.o. male who returns today for follow-up of his abdominal aortic aneurysm.  He is status post endovascular repair on 12/11/2012.  At that time his aneurysm measured 5.5 cm.  He has a known type II endoleak.  He was evaluated 2-3 weeks ago and on CT scan there was concern that the aneurysm was getting bigger and therefore he was sent for CT scan.  He is back today for further evaluation.  He does state that he has right flank pain and is concerned about a kidney stone.  Past Medical History:  Diagnosis Date  . AAA (abdominal aortic aneurysm) ([place])    ultrasound done in 3/11 and showed AAA 3.9 x 3.9cm  . Altered taste   . Anxiety   . Aortic insufficiency   . CAD (coronary artery disease)    patient had non-ST-elevation MI in February 2008 and there was an 80-90% proximal LAD stenosis, 90% proximal first diagonal stenosis, 99% ostial ramus stenosis. The first obtuse marginal was subtotally occluded and there was an 80% mid RCA stenosis. EF was estimated 25% on ventriculogram, coronary artery bypass grafting was done with a LIMA to the LAD, sequential saphenous vein graft to the PDA,  . Cancer ([place])    melanoma  . Carotid stenosis    Carotid US 3/17: bilat ICA 40-59% >> FU 1 year  . CHF (congestive heart failure) ([place])   . COPD (chronic obstructive pulmonary disease) ([place])   . Depression   . Diabetes mellitus   . DM2 (diabetes mellitus, type 2) ([place])   . History of PFTs    obstructive/restrictive. There is some reversibility with bronchodilator. The patient actually never smoked. He is on Spiriva  . HTN (hypertension)   . Irregular heart beat   . Melanoma ([place]) 09/2010   pT2a N0 M0; clark level IV.   Marland Kitchen Myocardial infarction ([place])   . Pain    back  . Range of motion  deficit 09/19/2011  . Shortness of breath    with exeration  . Torn rotator cuff    hx of. left     Family History  Problem Relation Age of Onset  . Hyperlipidemia Mother   . Hypertension Mother   . Heart disease Mother   . AAA (abdominal aortic aneurysm) Mother   . Hypertension Father   . AAA (abdominal aortic aneurysm) Father   . Heart disease Father   . Hyperlipidemia Father   . Throat cancer Maternal Uncle     SOCIAL HISTORY: Social History  Substance Use Topics  . Smoking status: Never Smoker  . Smokeless tobacco: Never Used  . Alcohol use No    Allergies  Allergen Reactions  . Zocor [Simvastatin] Other (See Comments)    Bone Pain    Current Outpatient Prescriptions  Medication Sig Dispense Refill  . acetaminophen-codeine (TYLENOL #3) 300-30 MG tablet Take 1 tablet by mouth every 4 (four) hours as needed.    Marland Kitchen amLODipine (NORVASC) 10 MG tablet Take 10 mg by mouth daily.     Marland Kitchen aspirin 81 MG chewable tablet Chew 81 mg by mouth daily.    Marland Kitchen atorvastatin (LIPITOR) 40 MG tablet Take 40 mg by mouth daily.    . benazepril (LOTENSIN) 40 MG tablet Take 40 mg by mouth daily.     Marland Kitchen  carvedilol (COREG) 25 MG tablet Take 1 tablet (25 mg total) by mouth 2 (two) times daily.    . chlorthalidone (HYGROTON) 25 MG tablet Take 1 tablet (25 mg total) by mouth daily. 90 tablet 3  . cholecalciferol (VITAMIN D) 1000 UNITS tablet Take 1,000 Units by mouth daily.    . clopidogrel (PLAVIX) 75 MG tablet Take 1 tablet (75 mg total) by mouth daily. 90 tablet 3  . Coenzyme Q10 200 MG capsule Take 200 mg by mouth daily.    [name] Sodium (COLACE PO) Take 1 capsule by mouth 2 (two) times daily as needed (for constipation).     . GARLIC PO Take by mouth daily.    Marland Kitchen glipiZIDE (GLUCOTROL) 10 MG tablet Take 10 mg by mouth daily.     . metFORMIN (GLUMETZA) 500 MG (MOD) 24 hr tablet Take 500 mg by mouth daily with breakfast.    . Multiple Vitamin (MULTIVITAMIN) tablet Take 1 tablet by mouth daily.       . nortriptyline (PAMELOR) 50 MG capsule Take 50 mg by mouth at bedtime.    . Omega-3 Fatty Acids (FISH OIL TRIPLE STRENGTH) 1400 MG CAPS Take 1,400 mg by mouth every morning.    . potassium chloride SA (K-DUR,KLOR-CON) 20 MEQ tablet Take 1 tablet (20 mEq total) by mouth 3 (three) times daily. 360 tablet 3  . sildenafil (VIAGRA) 50 MG tablet Take 100 mg by mouth daily as needed for erectile dysfunction.     Marland Kitchen tiotropium (SPIRIVA) 18 MCG inhalation capsule Place 18 mcg into inhaler and inhale daily.      . traZODone (DESYREL) 100 MG tablet Take 100 mg by mouth at bedtime.      No current facility-administered medications for this visit.     REVIEW OF SYSTEMS:  [X]  denotes positive finding, [ ]  denotes negative finding Cardiac  Comments:  Chest pain or chest pressure:    Shortness of breath upon exertion:    Short of breath when lying flat:    Irregular heart rhythm:        Vascular    Pain in calf, thigh, or hip brought on by ambulation:    Pain in feet at night that wakes you up from your sleep:     Blood clot in your veins:    Leg swelling:         Pulmonary    Oxygen at home:    Productive cough:     Wheezing:         Neurologic    Sudden weakness in arms or legs:     Sudden numbness in arms or legs:     Sudden onset of difficulty speaking or slurred speech:    Temporary loss of vision in one eye:     Problems with dizziness:         Gastrointestinal    Blood in stool:     Vomited blood:         Genitourinary    Burning when urinating:   Right flank pain   Blood in urine:        Psychiatric    Major depression:         Hematologic    Bleeding problems:    Problems with blood clotting too easily:        Skin    Rashes or ulcers:        Constitutional    Fever or chills:      PHYSICAL EXAM: Vitals:  02/12/16 1051  BP: 111/71  Pulse: 62  Resp: 16  Temp: 97.3 F (36.3 C)  TempSrc: Oral  SpO2: 97%  Weight: 194 lb (88 kg)  Height: 6' (1.829 m)     GENERAL: The patient is a well-nourished male, in no acute distress. The vital signs are documented above. CARDIAC: There is a regular rate and rhythm.  PULMONARY: There is good air exchange bilaterally without wheezing or rales. ABDOMEN: Soft and non-tender with normal pitched bowel sounds.  MUSCULOSKELETAL: There are no major deformities or cyanosis. NEUROLOGIC: No focal weakness or paresthesias are detected. SKIN: There are no ulcers or rashes noted. PSYCHIATRIC: The patient has a normal affect.  DATA:  I have reviewed his CT scan with the following findings: 1. Successful endovascular aortic repair of infrarenal abdominal aortic aneurysm. The excluded aneurysm sac demonstrates no interval change at 5.1 cm compared to July 18, 2014. The previously noted type 2 endoleak is improving an is significantly less conspicuous on today's examination.  MEDICAL ISSUES: AAA: The patient's aneurysm has remained stable for 1.5 years.  He has a known type II endoleak which appears to be decreasing in size.  I have recommended repeating his CT angiogram in 1 year.    Annamarie Major, MD [facility] of [facility] [phone] Pager [phone]

## 2016-02-13 ENCOUNTER — Other Ambulatory Visit: Payer: Self-pay | Admitting: *Deleted

## 2016-02-13 ENCOUNTER — Other Ambulatory Visit: Payer: Self-pay | Admitting: Surgery

## 2016-02-13 DIAGNOSIS — I714 Abdominal aortic aneurysm, without rupture, unspecified: Secondary | ICD-10-CM

## 2016-02-15 ENCOUNTER — Encounter: Payer: Self-pay | Admitting: Cardiology

## 2016-02-22 ENCOUNTER — Other Ambulatory Visit: Payer: Self-pay | Admitting: Family Medicine

## 2016-02-22 DIAGNOSIS — R1031 Right lower quadrant pain: Secondary | ICD-10-CM

## 2016-02-26 ENCOUNTER — Ambulatory Visit
Admission: RE | Admit: 2016-02-26 | Discharge: 2016-02-26 | Disposition: A | Discharge status: Home / Self Care | Payer: Medicare Other | Source: Ambulatory Visit | Attending: Family Medicine | Admitting: Family Medicine

## 2016-02-26 DIAGNOSIS — R1031 Right lower quadrant pain: Secondary | ICD-10-CM

## 2016-03-04 ENCOUNTER — Encounter: Payer: Self-pay | Admitting: Cardiology

## 2016-03-04 ENCOUNTER — Ambulatory Visit (INDEPENDENT_AMBULATORY_CARE_PROVIDER_SITE_OTHER): Payer: Medicare Other | Admitting: Cardiology

## 2016-03-04 VITALS — BP 124/64 | HR 75 | Ht 72.0 in | Wt 192.0 lb

## 2016-03-04 DIAGNOSIS — I6523 Occlusion and stenosis of bilateral carotid arteries: Secondary | ICD-10-CM

## 2016-03-04 DIAGNOSIS — I2510 Atherosclerotic heart disease of native coronary artery without angina pectoris: Secondary | ICD-10-CM | POA: Diagnosis not present

## 2016-03-04 DIAGNOSIS — I359 Nonrheumatic aortic valve disorder, unspecified: Secondary | ICD-10-CM

## 2016-03-04 DIAGNOSIS — I5032 Chronic diastolic (congestive) heart failure: Secondary | ICD-10-CM

## 2016-03-04 NOTE — Patient Instructions (Signed)
Medication Instructions:  Your physician recommends that you continue on your current medications as directed. Please refer to the Current Medication list given to you today.   Labwork: Lipid profile/BMET today  Testing/Procedures: Your physician has requested that you have a carotid duplex. This test is an ultrasound of the carotid arteries in your neck. It looks at blood flow through these arteries that supply the brain with blood. Allow one hour for this exam. There are no restrictions or special instructions. MARCH 2018   Follow-Up: Your physician wants you to follow-up in: 1 year with Dr End. (September 2018).  You will receive a reminder letter in the mail two months in advance. If you don't receive a letter, please call our office to schedule the follow-up appointment.        If you need a refill on your cardiac medications before your next appointment, please call your pharmacy.

## 2016-03-04 NOTE — Progress Notes (Signed)
Patient ID: Mitchell [name], male   DOB: 23-Aug-1945, 70 y.o.   MRN: [id number] PCP: Dr. Arelia Sneddon  70 yo with h/o CAD s/p CABG, aortic aneurysm s/p repair, HTN, DM2 returns to cardiology clinic.  In 6/14 he had EVAR for repair of AAA.  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 Trula Slade.  He denies chest pain.  He has stable exertional dyspnea.  He is mildly short of breath walking up stairs and is short of breath walking 100-200 feet in [employer].  He is bothered by low back pain still.  He has an inguinal hernia and will be seeing surgery for evaluation soon.   Labs (8/10): K 3.8, creatinine 1.12, LDL 61, HDL 29 Labs (3/11): HDL 32, LDL 55, LFTs normal Labs (10/11): LDL 55, HDL 27, K 3.9, creatinine 1.0 Labs (4/12): LDL 53, HDL 27 Labs (10/12): LDL 49, HDL 39 Labs (4/13): LDL 42, HDL 31 Labs (10/13): K 3.7, creatinine 1.3 Labs (3/14): LDL 60, HDL 21 Labs (4/14): K 3.7, creatinine 1.2 Labs (6/14): K 3, creatinine 1  Labs (3/15): LDL 63, HDL 29 Labs (7/15): creatinine 1.2 Labs (8/15): BNP normal Labs (10/16): LDL 77 Labs (5/17): K 4.3, creatinine 1.4  ECG: NSR, LVH  Allergies (verified):  No Known Drug Allergies  Past Medical History: 1. Coronary artery disease.  The patient had non-ST-elevation MI in February 2008 and there was an 80-90% proximal LAD stenosis, 90%proximal first diagonal stenosis, 99% ostial ramus stenosis.  The first obtuse marginal was subtotally occluded and there was an 80% mid RCA stenosis.  EF was estimated 25% on ventriculogram, coronary artery bypass grafting was done with a LIMA to the LAD, sequential saphenous vein graft to the PDA, and PLOM, saphenous graft to the diagonal, saphenous vein graft to the ramus. - Cardiolite (12/15) with EF 52%, no ischemia.  2. Aortic insufficiency: Echo (3/11): EF 55-60%, moderate diastolic dysfunction, moderate aortic insufficiency, moderate LAE.  Echo (3/12) with EF 50-55%, grade II diastolic  dysfunction, mild MR, mild aortic insufficiency.  Echo (9/15) with EF 55-60%, mild AI, mild MR, mildly dilated RV with normal systolic function.  3. Infrarenal AAA. Abdominal US 3/11 with AAA 3.9 x 3.9 cm.  Abdominal US 3/12 with AAA 3.8 x 3.9 cm.  Abdominal US (3/13) with AAA 4.6 x 3.9 with complex plaque within the AAA.  CTA abdomen showed 4 cm saccular infrarenal aneurysm.  CTA abdomen 5/14 showed saccular AAA grown to 5.3 cm. EVAR for repair in 6/14.  4. Hypertension: Renal artery dopplers in 5/13 with no significant renal artery stenosis.  5. Type 2 diabetes. 6. History of torn left rotator cuff. 7. History of obstructive/restrictive PFTs.  There is some reversibility with bronchodilator.  The patient actually never smoked.  He is on Spiriva. 8. Holter (3/10):  Frequent PACs and blocked PACs.  PVCs with trigeminy.  No long pauses.  Slowest HR in 40s while asleep.  9. Carotid stenosis.  Carotid dopplers (3/12) with stable 40-59% bilateral ICA stenosis.  Carotid dopplers (3/13) with 40-59% bilateral stenosis. Carotid dopplers (3/14) with 40-59% bilateral stenosis. Carotid dopplers (3/15) with 40-59% bilateral ICA stenosis. - Carotid dopplers (3/17): 40-59% BICA stenosis.  10.  Melanoma: s/p excision from back 5/12.   Family History: Both the patient's mother and father had abdominal aortic aneurysm and both died due to complications with attempted AAA repair.  Social History: The patient is a lifetime nonsmoker. He is unemployed but draws a pension  from the [employer].  He lives by himself.  He has no close family left, he only has cousins. He plays the piano in church.   ROS:  All systems reviewed and negative except as per HPI.    Current Outpatient Prescriptions  Medication Sig Dispense Refill  . amLODipine (NORVASC) 10 MG tablet Take 10 mg by mouth daily.     Marland Kitchen aspirin 81 MG chewable tablet Chew 81 mg by mouth daily.    Marland Kitchen atorvastatin (LIPITOR) 40 MG tablet Take 40 mg by mouth daily.    .  benazepril (LOTENSIN) 40 MG tablet Take 40 mg by mouth daily.     . carvedilol (COREG) 25 MG tablet Take 1 tablet (25 mg total) by mouth 2 (two) times daily.    . chlorthalidone (HYGROTON) 25 MG tablet Take 1 tablet (25 mg total) by mouth daily. 90 tablet 3  . cholecalciferol (VITAMIN D) 1000 UNITS tablet Take 1,000 Units by mouth daily.    . clopidogrel (PLAVIX) 75 MG tablet Take 1 tablet (75 mg total) by mouth daily. 90 tablet 3  . Coenzyme Q10 200 MG capsule Take 200 mg by mouth daily.    [name] Sodium (COLACE PO) Take 1 capsule by mouth 2 (two) times daily as needed (for constipation).     . GARLIC PO Take by mouth daily.    Marland Kitchen glipiZIDE (GLUCOTROL) 10 MG tablet Take 10 mg by mouth daily.     Marland Kitchen levofloxacin (LEVAQUIN) 750 MG tablet Take 1 tablet by mouth daily.    . metFORMIN (GLUMETZA) 500 MG (MOD) 24 hr tablet Take 500 mg by mouth daily with breakfast.    . Multiple Vitamin (MULTIVITAMIN) tablet Take 1 tablet by mouth daily.      . nortriptyline (PAMELOR) 50 MG capsule Take 50 mg by mouth at bedtime.    . Omega-3 Fatty Acids (FISH OIL TRIPLE STRENGTH) 1400 MG CAPS Take 1,400 mg by mouth every morning.    Marland Kitchen oxyCODONE-acetaminophen (PERCOCET/ROXICET) 5-325 MG tablet Take 1 tablet by mouth every 4 (four) hours as needed for pain.    . potassium chloride SA (K-DUR,KLOR-CON) 20 MEQ tablet Take 20 mEq by mouth 2 (two) times daily.    . sildenafil (VIAGRA) 50 MG tablet Take 100 mg by mouth daily as needed for erectile dysfunction.     Marland Kitchen tiotropium (SPIRIVA) 18 MCG inhalation capsule Place 18 mcg into inhaler and inhale daily.      . traZODone (DESYREL) 100 MG tablet Take 100 mg by mouth at bedtime.      No current facility-administered medications for this visit.      BP 124/64   Pulse 75   Ht 6' (1.829 m)   Wt 192 lb (87.1 kg)   BMI 26.04 kg/m  General:  Well developed, well nourished, in no acute distress. Neck:  Neck supple, no JVD. No masses, thyromegaly or abnormal cervical  nodes. Lungs:  Slight crackles at bases.  Heart:  Non-displaced PMI, chest non-tender; regular rate and rhythm, S1, S2 without murmurs, rubs. +S4. Carotid upstroke normal, no bruit. Pedals normal pulses. 1+ bilateral ankle edema Abdomen:  Bowel sounds positive; abdomen soft and non-tender without masses, organomegaly, or hernias noted. No hepatosplenomegaly. Extremities:  No clubbing or cyanosis. Neurologic:  Alert and oriented x 3. Psych:  Normal affect.  Assessment/Plan:  CORONARY ARTERY DISEASE No ischemic symptoms. Last Cardiolite was in 12/15, no ischemia.  - Continue ASA, Plavix, statin, Coreg, ACEI.   HYPERLIPIDEMIA Check lipids today.  AAA  S/p repair by Dr. Trula Slade (EVAR). Has type II Endoleak, being followed.  CAROTID ARTERY STENOSIS  Stable moderate carotid stenosis. Repeat carotid dopplers in 3/18.  HYPERTENSION BP controlled.  No renal artery stenosis on dopplers in 5/13.   Given my transition to CHF clinic, I will have him followup in 1 year with Dr End.    Loralie Champagne 03/04/2016

## 2016-03-05 ENCOUNTER — Other Ambulatory Visit: Payer: Self-pay | Admitting: *Deleted

## 2016-03-05 LAB — BASIC METABOLIC PANEL WITH GFR
BUN: 28 mg/dL — ABNORMAL HIGH (ref 7–25)
CO2: 25 mmol/L (ref 20–31)
Calcium: 9.5 mg/dL (ref 8.6–10.3)
Chloride: 99 mmol/L (ref 98–110)
Creat: 1.26 mg/dL — ABNORMAL HIGH (ref 0.70–1.18)
Glucose, Bld: 111 mg/dL — ABNORMAL HIGH (ref 65–99)
Potassium: 3.3 mmol/L — ABNORMAL LOW (ref 3.5–5.3)
Sodium: 138 mmol/L (ref 135–146)

## 2016-03-05 LAB — LIPID PANEL
CHOLESTEROL: 120 mg/dL — AB (ref 125–200)
HDL: 32 mg/dL — ABNORMAL LOW (ref >=40)
LDL Cholesterol: 59 mg/dL (ref <130)
Total CHOL/HDL Ratio: 3.8 Ratio (ref <=5.0)
Triglycerides: 144 mg/dL (ref <150)
VLDL: 29 mg/dL (ref <30)

## 2016-03-07 ENCOUNTER — Other Ambulatory Visit: Payer: Self-pay | Admitting: Surgery

## 2016-04-16 ENCOUNTER — Encounter (HOSPITAL_COMMUNITY): Payer: Self-pay

## 2016-04-16 ENCOUNTER — Encounter (HOSPITAL_COMMUNITY)
Admission: RE | Admit: 2016-04-16 | Discharge: 2016-04-16 | Disposition: A | Discharge status: Home / Self Care | Payer: Medicare Other | Source: Ambulatory Visit | Attending: Surgery | Admitting: Surgery

## 2016-04-16 DIAGNOSIS — K4020 Bilateral inguinal hernia, without obstruction or gangrene, not specified as recurrent: Secondary | ICD-10-CM | POA: Diagnosis present

## 2016-04-16 DIAGNOSIS — E1151 Type 2 diabetes mellitus with diabetic peripheral angiopathy without gangrene: Secondary | ICD-10-CM | POA: Diagnosis not present

## 2016-04-16 DIAGNOSIS — Z86010 Personal history of colonic polyps: Secondary | ICD-10-CM | POA: Diagnosis not present

## 2016-04-16 DIAGNOSIS — Z85820 Personal history of malignant melanoma of skin: Secondary | ICD-10-CM | POA: Diagnosis not present

## 2016-04-16 DIAGNOSIS — I509 Heart failure, unspecified: Secondary | ICD-10-CM | POA: Diagnosis not present

## 2016-04-16 DIAGNOSIS — Z951 Presence of aortocoronary bypass graft: Secondary | ICD-10-CM | POA: Diagnosis not present

## 2016-04-16 DIAGNOSIS — I252 Old myocardial infarction: Secondary | ICD-10-CM | POA: Diagnosis not present

## 2016-04-16 DIAGNOSIS — D649 Anemia, unspecified: Secondary | ICD-10-CM | POA: Diagnosis not present

## 2016-04-16 DIAGNOSIS — N400 Enlarged prostate without lower urinary tract symptoms: Secondary | ICD-10-CM | POA: Diagnosis not present

## 2016-04-16 DIAGNOSIS — N529 Male erectile dysfunction, unspecified: Secondary | ICD-10-CM | POA: Diagnosis not present

## 2016-04-16 DIAGNOSIS — I110 Hypertensive heart disease with heart failure: Secondary | ICD-10-CM | POA: Diagnosis not present

## 2016-04-16 DIAGNOSIS — J449 Chronic obstructive pulmonary disease, unspecified: Secondary | ICD-10-CM | POA: Diagnosis not present

## 2016-04-16 DIAGNOSIS — Z7982 Long term (current) use of aspirin: Secondary | ICD-10-CM | POA: Diagnosis not present

## 2016-04-16 DIAGNOSIS — F329 Major depressive disorder, single episode, unspecified: Secondary | ICD-10-CM | POA: Diagnosis not present

## 2016-04-16 DIAGNOSIS — F419 Anxiety disorder, unspecified: Secondary | ICD-10-CM | POA: Diagnosis not present

## 2016-04-16 DIAGNOSIS — E7800 Pure hypercholesterolemia, unspecified: Secondary | ICD-10-CM | POA: Diagnosis not present

## 2016-04-16 DIAGNOSIS — Z7902 Long term (current) use of antithrombotics/antiplatelets: Secondary | ICD-10-CM | POA: Diagnosis not present

## 2016-04-16 DIAGNOSIS — I2510 Atherosclerotic heart disease of native coronary artery without angina pectoris: Secondary | ICD-10-CM | POA: Diagnosis not present

## 2016-04-16 DIAGNOSIS — Z7984 Long term (current) use of oral hypoglycemic drugs: Secondary | ICD-10-CM | POA: Diagnosis not present

## 2016-04-16 DIAGNOSIS — Z888 Allergy status to other drugs, medicaments and biological substances status: Secondary | ICD-10-CM | POA: Diagnosis not present

## 2016-04-16 HISTORY — DX: Myoneural disorder, unspecified: G70.9

## 2016-04-16 HISTORY — DX: Unspecified osteoarthritis, unspecified site: M19.90

## 2016-04-16 HISTORY — DX: Other specified postprocedural states: Z98.890

## 2016-04-16 HISTORY — DX: Anemia, unspecified: D64.9

## 2016-04-16 LAB — COMPREHENSIVE METABOLIC PANEL
ALT: 18 U/L (ref 17–63)
AST: 24 U/L (ref 15–41)
Albumin: 3.9 g/dL (ref 3.5–5.0)
Alkaline Phosphatase: 43 U/L (ref 38–126)
Anion gap: 8 (ref 5–15)
BILIRUBIN TOTAL: 0.5 mg/dL (ref 0.3–1.2)
BUN: 28 mg/dL — AB (ref 6–20)
CO2: 24 mmol/L (ref 22–32)
CREATININE: 1.51 mg/dL — AB (ref 0.61–1.24)
Calcium: 9.4 mg/dL (ref 8.9–10.3)
Chloride: 104 mmol/L (ref 101–111)
GFR, EST AFRICAN AMERICAN: 52 mL/min — AB (ref >60)
GFR, EST NON AFRICAN AMERICAN: 45 mL/min — AB (ref >60)
Glucose, Bld: 126 mg/dL — ABNORMAL HIGH (ref 65–99)
POTASSIUM: 3.7 mmol/L (ref 3.5–5.1)
Sodium: 136 mmol/L (ref 135–145)
TOTAL PROTEIN: 7.8 g/dL (ref 6.5–8.1)

## 2016-04-16 LAB — CBC
HEMATOCRIT: 41.5 % (ref 39.0–52.0)
Hemoglobin: 14 g/dL (ref 13.0–17.0)
MCH: 32.6 pg (ref 26.0–34.0)
MCHC: 33.7 g/dL (ref 30.0–36.0)
MCV: 96.5 fL (ref 78.0–100.0)
Platelets: 171 10*3/uL (ref 150–400)
RBC: 4.3 MIL/uL (ref 4.22–5.81)
RDW: 13.4 % (ref 11.5–15.5)
WBC: 8.7 10*3/uL (ref 4.0–10.5)

## 2016-04-16 LAB — GLUCOSE, CAPILLARY: Glucose-Capillary: 123 mg/dL — ABNORMAL HIGH (ref 65–99)

## 2016-04-16 NOTE — Pre-Procedure Instructions (Signed)
Mitchell [name]  04/16/2016      [facility] [place] [id number] [place] ([place]) [place] [address] Phone: [phone] Fax: [phone]    Your procedure is scheduled on .04/18/2016  Report to [facility] Admitting at 10:45 A.M.  Call this number if you have problems the morning of surgery:  [phone]   Remember:  Do not eat food or drink liquids after midnight.  Take these medicines the morning of surgery with A SIP OF WATER : AMLODIPINE, CARVEDILOL, SPIRVIA,( IF needed you may take pain medicine)    Do not wear jewelry .  Do not wear lotions, powders, or perfumes, or deoderant.   .  Men may shave face and neck.   Do not bring valuables to the hospital.   [facility] is not responsible for any belongings or valuables.  Contacts, dentures or bridgework may not be worn into surgery.  Leave your suitcase in the car.  After surgery it may be brought to your room.  For patients admitted to the hospital, discharge time will be determined by your treatment team.  Patients discharged the day of surgery will not be allowed to drive home.   Name and phone number of your driver:   Cousin  Special instructions:  Special Instructions: [place] - Preparing for Surgery  Before surgery, you can play an important role.  Because skin is not sterile, your skin needs to be as free of germs as possible.  You can reduce the number of germs on you skin by washing with CHG (chlorahexidine gluconate) soap before surgery.  CHG is an antiseptic cleaner which kills germs and bonds with the skin to continue killing germs even after washing.  Please DO NOT use if you have an allergy to CHG or antibacterial soaps.  If your skin becomes reddened/irritated stop using the CHG and inform your nurse when you arrive at Short Stay.  Do not shave (including legs and underarms) for at least 48 hours prior to the first CHG shower.  You may  shave your face.  Please follow these instructions carefully:   1.  Shower with CHG Soap the night before surgery and the  morning of Surgery.  2.  If you choose to wash your hair, wash your hair first as usual with your  normal shampoo.  3.  After you shampoo, rinse your hair and body thoroughly to remove the  Shampoo.  4.  Use CHG as you would any other liquid soap.  You can apply chg directly to the skin and wash gently with scrungie or a clean washcloth.  5.  Apply the CHG Soap to your body ONLY FROM THE NECK DOWN.    Do not use on open wounds or open sores.  Avoid contact with your eyes, ears, mouth and genitals (private parts).  Wash genitals (private parts)   with your normal soap.  6.  Wash thoroughly, paying special attention to the area where your surgery will be performed.  7.  Thoroughly rinse your body with warm water from the neck down.  8.  DO NOT shower/wash with your normal soap after using and rinsing off   the CHG Soap.  9.  Pat yourself dry with a clean towel.            10.  Wear clean pajamas.            11.  Place clean  sheets on your bed the night of your first shower and do not sleep with pets.  Day of Surgery  Do not apply any lotions/deodorants the morning of surgery.  Please wear clean clothes to the hospital/surgery center.   How to Manage Your Diabetes Before and After Surgery  Why is it important to control my blood sugar before and after surgery? . Improving blood sugar levels before and after surgery helps healing and can limit problems. . A way of improving blood sugar control is eating a healthy diet by: o  Eating less sugar and carbohydrates o  Increasing activity/exercise o  Talking with your doctor about reaching your blood sugar goals . High blood sugars (greater than 180 mg/dL) can raise your risk of infections and slow your recovery, so you will need to focus on controlling your diabetes during the weeks before surgery. . Make sure that the  doctor who takes care of your diabetes knows about your planned surgery including the date and location.  How do I manage my blood sugar before surgery? . Check your blood sugar at least 4 times a day, starting 2 days before surgery, to make sure that the level is not too high or low. o Check your blood sugar the morning of your surgery when you wake up and every 2 hours until you get to the Short Stay unit. . If your blood sugar is less than 70 mg/dL, you will need to treat for low blood sugar: o Do not take insulin. o Treat a low blood sugar (less than 70 mg/dL) with  cup of clear juice (cranberry or apple), 4 glucose tablets, OR glucose gel. o Recheck blood sugar in 15 minutes after treatment (to make sure it is greater than 70 mg/dL). If your blood sugar is not greater than 70 mg/dL on recheck, call [phone] for further instructions. . Report your blood sugar to the short stay nurse when you get to Short Stay.  . If you are admitted to the hospital after surgery: o Your blood sugar will be checked by the staff and you will probably be given insulin after surgery (instead of oral diabetes medicines) to make sure you have good blood sugar levels. o The goal for blood sugar control after surgery is 80-180 mg/dL.              WHAT DO I DO ABOUT MY DIABETES MEDICATION?   Marland Kitchen Do not take oral diabetes medicines (pills) the morning of surgery.   .      Other Instructions:          Patient Signature:  Date:   Nurse Signature:  Date:   Reviewed and Endorsed by [facility] Patient Education Committee, August 2015  Please read over the following fact sheets that you were given. Pain Booklet, Coughing and Deep Breathing and Surgical Site Infection Prevention

## 2016-04-16 NOTE — Progress Notes (Signed)
Pt. Followed by [facility] for medicines only.  Sees Dr. Gwyneth Revels for PCP, followed by Dr. Aundra Dubin for cardiac care.

## 2016-04-17 LAB — HEMOGLOBIN A1C
Hgb A1c MFr Bld: 6.1 % — ABNORMAL HIGH (ref 4.8–5.6)
Mean Plasma Glucose: 128 mg/dL

## 2016-04-17 NOTE — Progress Notes (Signed)
Anesthesia Chart Review: Patient is a 70 year old male scheduled for bilateral laparoscopic inguinal hernia repair with mesh on 04/18/16 by Dr. Coralie Keens. Case was moved from day surgical center per anesthesia.  History includes non-smoker, CAD/NSTEMI s/p CABG (LIMA-LAD, SVG-PDA-PLA, SVG-RAMUS, SVG-DAIG) 07/31/06, ischemic cardiomyopathy '08, AI (mild per '15 echo), HTN, AAA s/p EVAR 12/11/12 with type II endoleak, DM2, carotid artery stenosis (moderate '17), melanoma excision (back) '12, COPD, anxiety, depression, left rotator cuff repair '16.   PCP is Dr. Arelia Sneddon. He receives medications thru the [facility]. Cardiologist is Dr. Aundra Dubin, last visit 03/04/16. Dr. Aundra Dubin is transitioning to the [facility], so next year patient will start seeing Dr. Saunders Revel. Vascular surgeon is Dr. Trula Slade, last visit 02/12/16. Repeat CT scan in one year recommended.  Meds include ASA 81 mg, Plavix, amlodipine, Lipitor, benazepril, Coreg, chlorthalidone, glipizide, metformin, Pamelor, fish oil, Percocet, KCl, Viagra, Spiriva, trazodone. Patient to hold ASA and Plavix for 5 days prior to surgery.   BP 117/62   Pulse 73   Temp 36.7 C   Resp 20   Ht 6' (1.829 m)   Wt 186 lb (84.4 kg)   SpO2 99%   BMI 25.23 kg/m   03/04/16 EKG: NSR, minimal voltage criteria for LVH, may be normal variant.  06/02/14 Nuclear stress test: Overall Impression:  Poor exercise capacity, but otherwise low risk stress nuclear study with borderline reduction in overall systolic function. Consider nonischemic cardiomyopathy. LV Ejection Fraction: 52%.  LV Wall Motion:  Mild global hypokinesis and borderline LVEF  01/31/14 Echo: Study Conclusions - Left ventricle: The cavity size was normal. Systolic function was normal. The estimated ejection fraction was in the range of 55% to 60%. Wall motion was normal; there were no regional wall motion abnormalities. - Aortic valve: There was mild regurgitation. - Mitral valve: There was mild  regurgitation. - Left atrium: The atrium was mildly dilated. - Right ventricle: The cavity size was mildly dilated. Wall thickness was normal. - Atrial septum: There was increased thickness of the septum, consistent with lipomatous hypertrophy. - Tricuspid valve: There was mild regurgitation.  By note, Holter in 08/2008 showed: Frequent PACs and blocked PACs.  PVCs with trigeminy.  No long pauses.  Slowest HR in 40s while asleep.   Last cardiac cath was 07/28/06 pre-CABG and showed significant three-vessel CAD, EF 25% with severe global hypokinesis, moderate pulmonary hypertension.   08/28/15 Carotid dopplers: 40-59% BICA stenosis, > 50% RECA stenosis. Normal subclavian arteries bilaterally. Patent vertebral arteries with antegrade flow.   02/12/16 CTA Abd/pelvis: IMPRESSION: Vascular 1. Successful endovascular aortic repair of infrarenal abdominal aortic aneurysm. The excluded aneurysm sac demonstrates no interval change at 5.1 cm compared to July 18, 2014. The previously noted type 2 endoleak is improving an is significantly less conspicuous on today's examination. Non Vascular 1. Ancillary findings without significant interval change as above including bilateral left greater than right fat containing inguinal Hernias.  09/06/14 Spirometry: FVC 2.81 58%, FEV1 2.03 55%, FEF 25-75% 1.56 48%. Moderately severe restriction.  Preoperative labs noted. Cr 1.51 (up from 1.26-1.4 since 10/2015). Glucose 126. CBC WNL. A1c 6.1.  Patient with recent cardiology follow-up. No new testing ordered. If no acute changes then I would anticipate that he can proceed as planned.  George Hugh [facility] Short Stay Center/Anesthesiology Phone [phone] 04/17/2016 10:29 AM

## 2016-04-17 NOTE — H&P (Signed)
Mitchell [name]. Mitchell [name]  Location: [facility] Surgery Patient #: [id number] DOB: 1946-05-31 Divorced / Language: Mitchell [name] / Race: White Male   History of Present Illness Mitchell [name] A. Ninfa Linden MD;  Patient words: New-BIH.  The patient is a 70 year old male who presents with an inguinal hernia. This gentleman is referred by Mitchell [name] for a symptomatic right inguinal hernia. He is actually had a large left inguinal hernia for many years which is now asymptomatic. There were 3 or 4 weeks, he has had increasing right groin discomfort. He has been treated for epididymitis. He had an endovascular aneurysm repair earlier this year and CT scans demonstrated inguinal hernias. He has no obstructive symptoms. He just has intermittent sharp pain in the right groin. Again, he has had no pain on left side reports that the hernia is always easily reduced.   Other Problems Mitchell [name], Mitchell [name];  Back Pain Chronic Obstructive Lung Disease Congestive Heart Failure Depression Diabetes Mellitus Enlarged Prostate High blood pressure Hypercholesterolemia Inguinal Hernia Melanoma Myocardial infarction Transfusion history Vascular Disease  Past Surgical History Mitchell [name], Mitchell [name]; Aneurysm Repair Colon Polyp Removal - Colonoscopy Coronary Artery Bypass Graft Shoulder Surgery Left. Tonsillectomy  Diagnostic Studies History Mitchell [name], Mitchell [name];  Colonoscopy within last year  Allergies Mitchell [name], Mitchell [name];  Atorvastatin Calcium *ANTIHYPERLIPIDEMICS* Gabapentin *ANTICONVULSANTS*  Social History Mitchell [name], Mitchell [name]; Caffeine use Carbonated beverages, Coffee, Tea. No alcohol use No drug use Tobacco use Never smoker.  Family History Mitchell [name], Mitchell [name]; Heart Disease Father. Hypertension Father, Mother.    Review of Systems Mitchell [name] Mitchell [name] General Not Present- Appetite Loss, Chills, Fatigue, Fever, Night Sweats, Weight Gain and Weight Loss. Skin  Present- Non-Healing Wounds. Not Present- Change in Wart/Mole, Dryness, Hives, Jaundice, New Lesions, Rash and Ulcer. HEENT Not Present- Earache, Hearing Loss, Hoarseness, Nose Bleed, Oral Ulcers, Ringing in the Ears, Seasonal Allergies, Sinus Pain, Sore Throat, Visual Disturbances, Wears glasses/contact lenses and Yellow Eyes. Respiratory Not Present- Bloody sputum, Chronic Cough, Difficulty Breathing, Snoring and Wheezing. Breast Not Present- Breast Mass, Breast Pain, Nipple Discharge and Skin Changes. Cardiovascular Present- Shortness of Breath. Not Present- Chest Pain, Difficulty Breathing Lying Down, Leg Cramps, Palpitations, Rapid Heart Rate and Swelling of Extremities. Gastrointestinal Present- Abdominal Pain and Constipation. Not Present- Bloating, Bloody Stool, Change in Bowel Habits, Chronic diarrhea, Difficulty Swallowing, Excessive gas, Gets full quickly at meals, Hemorrhoids, Indigestion, Nausea, Rectal Pain and Vomiting. Male Genitourinary Present- Impotence. Not Present- Blood in Urine, Change in Urinary Stream, Frequency, Nocturia, Painful Urination, Urgency and Urine Leakage. Musculoskeletal Present- Back Pain. Not Present- Joint Pain, Joint Stiffness, Muscle Pain, Muscle Weakness and Swelling of Extremities. Neurological Present- Numbness. Not Present- Decreased Memory, Fainting, Headaches, Seizures, Tingling, Tremor, Trouble walking and Weakness. Psychiatric Present- Depression. Not Present- Anxiety, Bipolar, Change in Sleep Pattern, Fearful and Frequent crying. Endocrine Not Present- Cold Intolerance, Excessive Hunger, Hair Changes, Heat Intolerance, Hot flashes and New Diabetes. Hematology Present- Blood Thinners. Not Present- Easy Bruising, Excessive bleeding, Gland problems, HIV and Persistent Infections.  Vitals Mitchell [name] Mitchell [name];  Weight: 191.4 lb Temp.: 98.43F(Oral)  Pulse: 81 (Regular)  BP: 110/68 (Sitting, Left Arm, Standard)    Physical Exam (Toree Edling A.  Ninfa Linden MD;  General Mental Status-Alert. General Appearance-Consistent with stated age. Hydration-Well hydrated. Voice-Normal.  Head and Neck Head-normocephalic, atraumatic with no lesions or palpable masses. Trachea-midline.  Eye Eyeball - Bilateral-Extraocular movements intact. Sclera/Conjunctiva - Bilateral-No scleral icterus.  Chest and Lung Exam Chest and lung exam reveals -quiet, even and easy respiratory effort with no  use of accessory muscles and on auscultation, normal breath sounds, no adventitious sounds and normal vocal resonance. Inspection Chest Wall - Normal. Back - normal.  Cardiovascular Cardiovascular examination reveals -normal heart sounds, regular rate and rhythm with no murmurs and normal pedal pulses bilaterally.  Abdomen Inspection Skin - Scar - no surgical scars. Hernias - Inguinal hernia - Left - Reducible. Note: There is a large left inguinal hernia and a much smaller right inguinal hernia which are both reducible. Right - Reducible. Palpation/Percussion Palpation and Percussion of the abdomen reveal - Soft, Non Tender, No Rebound tenderness, No Rigidity (guarding) and No hepatosplenomegaly. Auscultation Auscultation of the abdomen reveals - Bowel sounds normal.  Neurologic - Did not examine.  Musculoskeletal - Did not examine.    Assessment & Plan (Pooja Camuso A. Ninfa Linden MD;   BILATERAL INGUINAL HERNIA (K40.20)  Impression: Because he is symptomatic from the hernia, I would recommend bilateral laparoscopic inguinal hernia repair with mesh. I discussed the procedure with him detail including the risks. These risks include but are not limited to bleeding, infection, injury to surrounding structures, recurrent hernia, cardiopulmonary issues, etc. He will have to stop his Plavix 5 days preoperatively as well as his aspirin. Surgery will be scheduled

## 2016-04-18 ENCOUNTER — Ambulatory Visit (HOSPITAL_COMMUNITY): Payer: Medicare Other | Admitting: Certified Registered Nurse Anesthetist

## 2016-04-18 ENCOUNTER — Encounter (HOSPITAL_COMMUNITY)
Admission: RE | Disposition: A | Discharge status: Home / Self Care | Payer: Self-pay | Source: Ambulatory Visit | Attending: Surgery

## 2016-04-18 ENCOUNTER — Observation Stay (HOSPITAL_COMMUNITY)
Admission: RE | Admit: 2016-04-18 | Discharge: 2016-04-19 | Disposition: A | Discharge status: Home / Self Care | Payer: Medicare Other | Source: Ambulatory Visit | Attending: Surgery | Admitting: Surgery

## 2016-04-18 ENCOUNTER — Ambulatory Visit (HOSPITAL_COMMUNITY): Payer: Medicare Other | Admitting: Vascular Surgery

## 2016-04-18 ENCOUNTER — Encounter (HOSPITAL_COMMUNITY): Payer: Self-pay | Admitting: *Deleted

## 2016-04-18 DIAGNOSIS — Z8719 Personal history of other diseases of the digestive system: Secondary | ICD-10-CM

## 2016-04-18 DIAGNOSIS — F419 Anxiety disorder, unspecified: Secondary | ICD-10-CM | POA: Insufficient documentation

## 2016-04-18 DIAGNOSIS — K4020 Bilateral inguinal hernia, without obstruction or gangrene, not specified as recurrent: Principal | ICD-10-CM | POA: Insufficient documentation

## 2016-04-18 DIAGNOSIS — I2510 Atherosclerotic heart disease of native coronary artery without angina pectoris: Secondary | ICD-10-CM | POA: Insufficient documentation

## 2016-04-18 DIAGNOSIS — Z86010 Personal history of colonic polyps: Secondary | ICD-10-CM | POA: Insufficient documentation

## 2016-04-18 DIAGNOSIS — Z888 Allergy status to other drugs, medicaments and biological substances status: Secondary | ICD-10-CM | POA: Insufficient documentation

## 2016-04-18 DIAGNOSIS — J449 Chronic obstructive pulmonary disease, unspecified: Secondary | ICD-10-CM | POA: Diagnosis not present

## 2016-04-18 DIAGNOSIS — Z7982 Long term (current) use of aspirin: Secondary | ICD-10-CM | POA: Insufficient documentation

## 2016-04-18 DIAGNOSIS — D649 Anemia, unspecified: Secondary | ICD-10-CM | POA: Insufficient documentation

## 2016-04-18 DIAGNOSIS — I509 Heart failure, unspecified: Secondary | ICD-10-CM | POA: Insufficient documentation

## 2016-04-18 DIAGNOSIS — Z9889 Other specified postprocedural states: Secondary | ICD-10-CM

## 2016-04-18 DIAGNOSIS — F329 Major depressive disorder, single episode, unspecified: Secondary | ICD-10-CM | POA: Diagnosis not present

## 2016-04-18 DIAGNOSIS — I110 Hypertensive heart disease with heart failure: Secondary | ICD-10-CM | POA: Insufficient documentation

## 2016-04-18 DIAGNOSIS — Z85820 Personal history of malignant melanoma of skin: Secondary | ICD-10-CM | POA: Insufficient documentation

## 2016-04-18 DIAGNOSIS — Z7984 Long term (current) use of oral hypoglycemic drugs: Secondary | ICD-10-CM | POA: Insufficient documentation

## 2016-04-18 DIAGNOSIS — N529 Male erectile dysfunction, unspecified: Secondary | ICD-10-CM | POA: Diagnosis not present

## 2016-04-18 DIAGNOSIS — E1151 Type 2 diabetes mellitus with diabetic peripheral angiopathy without gangrene: Secondary | ICD-10-CM | POA: Insufficient documentation

## 2016-04-18 DIAGNOSIS — Z7902 Long term (current) use of antithrombotics/antiplatelets: Secondary | ICD-10-CM | POA: Insufficient documentation

## 2016-04-18 DIAGNOSIS — E7800 Pure hypercholesterolemia, unspecified: Secondary | ICD-10-CM | POA: Insufficient documentation

## 2016-04-18 DIAGNOSIS — I252 Old myocardial infarction: Secondary | ICD-10-CM | POA: Insufficient documentation

## 2016-04-18 DIAGNOSIS — N400 Enlarged prostate without lower urinary tract symptoms: Secondary | ICD-10-CM | POA: Insufficient documentation

## 2016-04-18 DIAGNOSIS — Z951 Presence of aortocoronary bypass graft: Secondary | ICD-10-CM | POA: Insufficient documentation

## 2016-04-18 HISTORY — PX: INGUINAL HERNIA REPAIR: SHX194

## 2016-04-18 HISTORY — PX: INSERTION OF MESH: SHX5868

## 2016-04-18 LAB — GLUCOSE, CAPILLARY
GLUCOSE-CAPILLARY: 143 mg/dL — AB (ref 65–99)
GLUCOSE-CAPILLARY: 214 mg/dL — AB (ref 65–99)
Glucose-Capillary: 175 mg/dL — ABNORMAL HIGH (ref 65–99)

## 2016-04-18 SURGERY — REPAIR, HERNIA, INGUINAL, BILATERAL, LAPAROSCOPIC
Anesthesia: General | Site: Inguinal | Laterality: Bilateral

## 2016-04-18 MED ORDER — CHLORTHALIDONE 25 MG PO TABS
25.0000 mg | ORAL_TABLET | Freq: Every day | ORAL | Status: DC
  Administered 2016-04-18 – 2016-04-19 (×2): 25 mg via ORAL
  Filled 2016-04-18 (×2): qty 1

## 2016-04-18 MED ORDER — TIOTROPIUM BROMIDE MONOHYDRATE 18 MCG IN CAPS
18.0000 ug | ORAL_CAPSULE | Freq: Every day | RESPIRATORY_TRACT | Status: DC
  Administered 2016-04-19: 18 ug via RESPIRATORY_TRACT
  Filled 2016-04-18: qty 5

## 2016-04-18 MED ORDER — POTASSIUM CHLORIDE IN NACL 20-0.9 MEQ/L-% IV SOLN
INTRAVENOUS | Status: DC
  Administered 2016-04-18: 16:00:00 via INTRAVENOUS
  Filled 2016-04-18: qty 1000

## 2016-04-18 MED ORDER — ONDANSETRON HCL 4 MG/2ML IJ SOLN
INTRAMUSCULAR | Status: AC
  Filled 2016-04-18: qty 2

## 2016-04-18 MED ORDER — MORPHINE SULFATE (PF) 2 MG/ML IV SOLN: 1.0000 mg | INTRAVENOUS | Status: DC | PRN

## 2016-04-18 MED ORDER — ROCURONIUM BROMIDE 10 MG/ML (PF) SYRINGE
PREFILLED_SYRINGE | INTRAVENOUS | Status: DC | PRN
  Administered 2016-04-18: 50 mg via INTRAVENOUS

## 2016-04-18 MED ORDER — CHLORHEXIDINE GLUCONATE CLOTH 2 % EX PADS: 6.0000 | MEDICATED_PAD | Freq: Once | CUTANEOUS | Status: DC

## 2016-04-18 MED ORDER — CEFAZOLIN SODIUM-DEXTROSE 2-4 GM/100ML-% IV SOLN
2.0000 g | INTRAVENOUS | Status: AC
  Administered 2016-04-18: 2 g via INTRAVENOUS
  Filled 2016-04-18: qty 100

## 2016-04-18 MED ORDER — ONDANSETRON 4 MG PO TBDP: 4.0000 mg | ORAL_TABLET | Freq: Four times a day (QID) | ORAL | Status: DC | PRN

## 2016-04-18 MED ORDER — LIDOCAINE 2% (20 MG/ML) 5 ML SYRINGE
INTRAMUSCULAR | Status: AC
  Filled 2016-04-18: qty 5

## 2016-04-18 MED ORDER — FENTANYL CITRATE (PF) 100 MCG/2ML IJ SOLN
INTRAMUSCULAR | Status: AC
  Filled 2016-04-18: qty 4

## 2016-04-18 MED ORDER — GLIPIZIDE 5 MG PO TABS
5.0000 mg | ORAL_TABLET | Freq: Two times a day (BID) | ORAL | Status: DC
  Administered 2016-04-18 – 2016-04-19 (×2): 5 mg via ORAL
  Filled 2016-04-18 (×2): qty 1

## 2016-04-18 MED ORDER — EPHEDRINE 5 MG/ML INJ
INTRAVENOUS | Status: AC
  Filled 2016-04-18: qty 10

## 2016-04-18 MED ORDER — TRAZODONE HCL 100 MG PO TABS
100.0000 mg | ORAL_TABLET | Freq: Every day | ORAL | Status: DC
  Administered 2016-04-18: 100 mg via ORAL
  Filled 2016-04-18: qty 1

## 2016-04-18 MED ORDER — AMLODIPINE BESYLATE 10 MG PO TABS
10.0000 mg | ORAL_TABLET | Freq: Every day | ORAL | Status: DC
  Administered 2016-04-19: 10 mg via ORAL
  Filled 2016-04-18: qty 1

## 2016-04-18 MED ORDER — BUPIVACAINE-EPINEPHRINE 0.5% -1:200000 IJ SOLN
INTRAMUSCULAR | Status: DC | PRN
  Administered 2016-04-18: 50 mL

## 2016-04-18 MED ORDER — PROPOFOL 10 MG/ML IV BOLUS
INTRAVENOUS | Status: DC | PRN
  Administered 2016-04-18: 140 mg via INTRAVENOUS

## 2016-04-18 MED ORDER — ACETAMINOPHEN 650 MG RE SUPP: 650.0000 mg | Freq: Four times a day (QID) | RECTAL | Status: DC | PRN

## 2016-04-18 MED ORDER — BUPIVACAINE-EPINEPHRINE (PF) 0.5% -1:200000 IJ SOLN
INTRAMUSCULAR | Status: AC
  Filled 2016-04-18: qty 30

## 2016-04-18 MED ORDER — DEXAMETHASONE SODIUM PHOSPHATE 10 MG/ML IJ SOLN
INTRAMUSCULAR | Status: DC | PRN
  Administered 2016-04-18: 10 mg via INTRAVENOUS

## 2016-04-18 MED ORDER — EPHEDRINE SULFATE 50 MG/ML IJ SOLN
INTRAMUSCULAR | Status: DC | PRN
  Administered 2016-04-18 (×2): 10 mg via INTRAVENOUS

## 2016-04-18 MED ORDER — ROCURONIUM BROMIDE 10 MG/ML (PF) SYRINGE
PREFILLED_SYRINGE | INTRAVENOUS | Status: AC
  Filled 2016-04-18: qty 10

## 2016-04-18 MED ORDER — NORTRIPTYLINE HCL 25 MG PO CAPS
50.0000 mg | ORAL_CAPSULE | Freq: Every day | ORAL | Status: DC
  Administered 2016-04-18: 50 mg via ORAL
  Filled 2016-04-18: qty 2

## 2016-04-18 MED ORDER — SODIUM CHLORIDE 0.9 % IR SOLN
Status: DC | PRN
  Administered 2016-04-18: 1000 mL

## 2016-04-18 MED ORDER — PROPOFOL 10 MG/ML IV BOLUS
INTRAVENOUS | Status: AC
  Filled 2016-04-18: qty 20

## 2016-04-18 MED ORDER — HYDROMORPHONE HCL 2 MG/ML IJ SOLN
INTRAMUSCULAR | Status: AC
  Filled 2016-04-18: qty 1

## 2016-04-18 MED ORDER — LIDOCAINE 2% (20 MG/ML) 5 ML SYRINGE
INTRAMUSCULAR | Status: DC | PRN
  Administered 2016-04-18: 100 mg via INTRAVENOUS

## 2016-04-18 MED ORDER — DEXAMETHASONE SODIUM PHOSPHATE 10 MG/ML IJ SOLN
INTRAMUSCULAR | Status: AC
  Filled 2016-04-18: qty 1

## 2016-04-18 MED ORDER — FENTANYL CITRATE (PF) 100 MCG/2ML IJ SOLN
INTRAMUSCULAR | Status: DC | PRN
  Administered 2016-04-18: 100 ug via INTRAVENOUS

## 2016-04-18 MED ORDER — ONDANSETRON HCL 4 MG/2ML IJ SOLN
INTRAMUSCULAR | Status: DC | PRN
Start: 2016-04-18 — End: 2016-04-18
  Administered 2016-04-18: 4 mg via INTRAVENOUS

## 2016-04-18 MED ORDER — ENOXAPARIN SODIUM 40 MG/0.4ML ~~LOC~~ SOLN
40.0000 mg | SUBCUTANEOUS | Status: DC
  Administered 2016-04-19: 40 mg via SUBCUTANEOUS
  Filled 2016-04-18: qty 0.4

## 2016-04-18 MED ORDER — INSULIN ASPART 100 UNIT/ML ~~LOC~~ SOLN
0.0000 [IU] | Freq: Three times a day (TID) | SUBCUTANEOUS | Status: DC
  Administered 2016-04-18: 3 [IU] via SUBCUTANEOUS
  Administered 2016-04-19: 2 [IU] via SUBCUTANEOUS

## 2016-04-18 MED ORDER — GLYCOPYRROLATE 0.2 MG/ML IV SOSY
PREFILLED_SYRINGE | INTRAVENOUS | Status: AC
  Filled 2016-04-18: qty 3

## 2016-04-18 MED ORDER — NEOSTIGMINE METHYLSULFATE 5 MG/5ML IV SOSY
PREFILLED_SYRINGE | INTRAVENOUS | Status: AC
  Filled 2016-04-18: qty 5

## 2016-04-18 MED ORDER — CARVEDILOL 25 MG PO TABS
25.0000 mg | ORAL_TABLET | Freq: Two times a day (BID) | ORAL | Status: DC
  Administered 2016-04-18 – 2016-04-19 (×2): 25 mg via ORAL
  Filled 2016-04-18 (×2): qty 1

## 2016-04-18 MED ORDER — HYDROMORPHONE HCL 1 MG/ML IJ SOLN
0.2500 mg | INTRAMUSCULAR | Status: DC | PRN
  Administered 2016-04-18 (×4): 0.5 mg via INTRAVENOUS

## 2016-04-18 MED ORDER — SUGAMMADEX SODIUM 200 MG/2ML IV SOLN
INTRAVENOUS | Status: AC
  Filled 2016-04-18: qty 2

## 2016-04-18 MED ORDER — BENAZEPRIL HCL 40 MG PO TABS
40.0000 mg | ORAL_TABLET | Freq: Every day | ORAL | Status: DC
  Administered 2016-04-18 – 2016-04-19 (×2): 40 mg via ORAL
  Filled 2016-04-18 (×2): qty 1

## 2016-04-18 MED ORDER — ONDANSETRON HCL 4 MG/2ML IJ SOLN: 4.0000 mg | Freq: Four times a day (QID) | INTRAMUSCULAR | Status: DC | PRN

## 2016-04-18 MED ORDER — ASPIRIN 81 MG PO CHEW
81.0000 mg | CHEWABLE_TABLET | Freq: Every day | ORAL | Status: DC
  Administered 2016-04-18: 81 mg via ORAL
  Filled 2016-04-18: qty 1

## 2016-04-18 MED ORDER — SUGAMMADEX SODIUM 200 MG/2ML IV SOLN
INTRAVENOUS | Status: DC | PRN
  Administered 2016-04-18: 200 mg via INTRAVENOUS

## 2016-04-18 MED ORDER — METFORMIN HCL ER 500 MG PO TB24
500.0000 mg | ORAL_TABLET | Freq: Every day | ORAL | Status: DC
  Administered 2016-04-18: 500 mg via ORAL
  Filled 2016-04-18: qty 1

## 2016-04-18 MED ORDER — LACTATED RINGERS IV SOLN
INTRAVENOUS | Status: DC
  Administered 2016-04-18 (×2): via INTRAVENOUS

## 2016-04-18 MED ORDER — OXYCODONE HCL 5 MG PO TABS
5.0000 mg | ORAL_TABLET | ORAL | Status: DC | PRN
  Administered 2016-04-18 – 2016-04-19 (×3): 10 mg via ORAL
  Filled 2016-04-18 (×3): qty 2

## 2016-04-18 MED ORDER — ACETAMINOPHEN 325 MG PO TABS: 650.0000 mg | ORAL_TABLET | Freq: Four times a day (QID) | ORAL | Status: DC | PRN

## 2016-04-18 SURGICAL SUPPLY — 40 items
ADH SKN CLS APL DERMABOND .7 (GAUZE/BANDAGES/DRESSINGS) ×1
APPLIER CLIP LOGIC TI 5 (MISCELLANEOUS) IMPLANT
APR CLP MED LRG 33X5 (MISCELLANEOUS)
BLADE SURG ROTATE 9660 (MISCELLANEOUS) ×2 IMPLANT
CANISTER SUCTION 2500CC (MISCELLANEOUS) IMPLANT
CHLORAPREP W/TINT 26ML (MISCELLANEOUS) ×3 IMPLANT
COVER SURGICAL LIGHT HANDLE (MISCELLANEOUS) ×3 IMPLANT
DERMABOND ADVANCED (GAUZE/BANDAGES/DRESSINGS) ×2
DERMABOND ADVANCED .7 DNX12 (GAUZE/BANDAGES/DRESSINGS) ×1 IMPLANT
DEVICE SECURE STRAP 25 ABSORB (INSTRUMENTS) ×3 IMPLANT
DISSECT BALLN SPACEMKR + OVL (BALLOONS) ×3
DISSECTOR BALLN SPACEMKR + OVL (BALLOONS) ×1 IMPLANT
DISSECTOR BLUNT TIP ENDO 5MM (MISCELLANEOUS) IMPLANT
DRAPE LAPAROSCOPIC ABDOMINAL (DRAPES) ×1 IMPLANT
ELECT REM PT RETURN 9FT ADLT (ELECTROSURGICAL) ×3
ELECTRODE REM PT RTRN 9FT ADLT (ELECTROSURGICAL) ×1 IMPLANT
GLOVE ECLIPSE 7.0 STRL STRAW (GLOVE) ×2 IMPLANT
GLOVE INDICATOR 7.0 STRL GRN (GLOVE) ×4 IMPLANT
GLOVE SURG SIGNA 7.5 PF LTX (GLOVE) ×3 IMPLANT
GLOVE SURG SS PI 7.0 STRL IVOR (GLOVE) ×2 IMPLANT
GOWN STRL REUS W/ TWL LRG LVL3 (GOWN DISPOSABLE) ×2 IMPLANT
GOWN STRL REUS W/ TWL XL LVL3 (GOWN DISPOSABLE) ×1 IMPLANT
GOWN STRL REUS W/TWL LRG LVL3 (GOWN DISPOSABLE) ×9
GOWN STRL REUS W/TWL XL LVL3 (GOWN DISPOSABLE) ×3
KIT BASIN OR (CUSTOM PROCEDURE TRAY) ×3 IMPLANT
KIT ROOM TURNOVER OR (KITS) ×3 IMPLANT
MESH 3DMAX 4X6 LT LRG (Mesh General) ×3 IMPLANT
MESH 3DMAX 4X6 RT LRG (Mesh General) ×3 IMPLANT
NDL INSUFFLATION 14GA 120MM (NEEDLE) IMPLANT
NEEDLE INSUFFLATION 14GA 120MM (NEEDLE) IMPLANT
NS IRRIG 1000ML POUR BTL (IV SOLUTION) ×3 IMPLANT
PAD ARMBOARD 7.5X6 YLW CONV (MISCELLANEOUS) ×3 IMPLANT
SET IRRIG TUBING LAPAROSCOPIC (IRRIGATION / IRRIGATOR) IMPLANT
SET TROCAR LAP APPLE-HUNT 5MM (ENDOMECHANICALS) ×3 IMPLANT
SUT MNCRL AB 4-0 PS2 18 (SUTURE) ×3 IMPLANT
TOWEL OR 17X24 6PK STRL BLUE (TOWEL DISPOSABLE) ×3 IMPLANT
TOWEL OR 17X26 10 PK STRL BLUE (TOWEL DISPOSABLE) ×1 IMPLANT
TRAY FOLEY CATH 16FR SILVER (SET/KITS/TRAYS/PACK) ×1 IMPLANT
TRAY LAPAROSCOPIC MC (CUSTOM PROCEDURE TRAY) ×3 IMPLANT
TUBING INSUFFLATION (TUBING) ×3 IMPLANT

## 2016-04-18 NOTE — Anesthesia Preprocedure Evaluation (Signed)
Anesthesia Evaluation  Patient identified by MRN, date of birth, ID band Patient awake    Airway Mallampati: III  TM Distance: >3 FB Neck ROM: Limited    Dental  (+) Teeth Intact, Dental Advisory Given   Pulmonary shortness of breath and with exertion, COPD,  COPD inhaler,    breath sounds clear to auscultation + decreased breath sounds      Cardiovascular hypertension, Pt. on medications and Pt. on home beta blockers + CAD, + Past MI, + CABG, + Peripheral Vascular Disease and +CHF   Rhythm:Regular Rate:Normal     Neuro/Psych Anxiety Depression  Neuromuscular disease    GI/Hepatic   Endo/Other  diabetes  Renal/GU Renal disease     Musculoskeletal   Abdominal Normal abdominal exam  (+)   Peds  Hematology   Anesthesia Other Findings   Reproductive/Obstetrics                             Anesthesia Physical Anesthesia Plan  ASA: III  Anesthesia Plan: General   Post-op Pain Management:    Induction: Intravenous  Airway Management Planned: Oral ETT  Additional Equipment:   Intra-op Plan:   Post-operative Plan: Extubation in OR  Informed Consent: I have reviewed the patients History and Physical, chart, labs and discussed the procedure including the risks, benefits and alternatives for the proposed anesthesia with the patient or authorized representative who has indicated his/her understanding and acceptance.   Dental advisory given  Plan Discussed with: Anesthesiologist and Surgeon  Anesthesia Plan Comments:         Anesthesia Quick Evaluation

## 2016-04-18 NOTE — Anesthesia Preprocedure Evaluation (Addendum)
Anesthesia Evaluation  Patient identified by MRN, date of birth, ID band Patient awake    Reviewed: Allergy & Precautions, H&P , NPO status , Patient's Chart, lab work & pertinent test results, reviewed documented beta blocker date and time   Airway Mallampati: III  TM Distance: >3 FB Neck ROM: Full    Dental no notable dental hx. (+) Teeth Intact, Dental Advisory Given   Pulmonary COPD,  COPD inhaler,    Pulmonary exam normal breath sounds clear to auscultation       Cardiovascular hypertension, Pt. on medications and Pt. on home beta blockers + CAD, + Past MI, + CABG, + Peripheral Vascular Disease and +CHF   Rhythm:Regular Rate:Normal     Neuro/Psych Anxiety Depression negative neurological ROS     GI/Hepatic negative GI ROS, Neg liver ROS,   Endo/Other  diabetes, Type 2, Oral Hypoglycemic Agents  Renal/GU Renal InsufficiencyRenal disease  negative genitourinary   Musculoskeletal  (+) Arthritis ,   Abdominal   Peds  Hematology negative hematology ROS (+) anemia ,   Anesthesia Other Findings   Reproductive/Obstetrics negative OB ROS                            Anesthesia Physical Anesthesia Plan  ASA: III  Anesthesia Plan: General   Post-op Pain Management:    Induction: Intravenous  Airway Management Planned: Oral ETT  Additional Equipment:   Intra-op Plan:   Post-operative Plan: Extubation in OR  Informed Consent: I have reviewed the patients History and Physical, chart, labs and discussed the procedure including the risks, benefits and alternatives for the proposed anesthesia with the patient or authorized representative who has indicated his/her understanding and acceptance.   Dental advisory given  Plan Discussed with: CRNA  Anesthesia Plan Comments:         Anesthesia Quick Evaluation

## 2016-04-18 NOTE — Interval H&P Note (Signed)
History and Physical Interval Note:no change in H and P  04/18/2016 10:52 AM  Mitchell [name]  has presented today for surgery, with the diagnosis of Bilateral inguinal hernias  The various methods of treatment have been discussed with the patient and family. After consideration of risks, benefits and other options for treatment, the patient has consented to  Procedure(s): [place] (Bilateral) INSERTION OF MESH (Bilateral) as a surgical intervention .  The patient's history has been reviewed, patient examined, no change in status, stable for surgery.  I have reviewed the patient's chart and labs.  Questions were answered to the patient's satisfaction.     [name] A

## 2016-04-18 NOTE — Anesthesia Postprocedure Evaluation (Signed)
Anesthesia Post Note  Patient: Mitchell [name]  Procedure(s) Performed: Procedure(s) (LRB): LAPAROSCOPIC BILATERAL INGUINAL HERNIA REPAIR (Bilateral) INSERTION OF MESH (Bilateral)  Patient location during evaluation: PACU Anesthesia Type: General Level of consciousness: awake and alert Pain management: pain level controlled Vital Signs Assessment: post-procedure vital signs reviewed and stable Respiratory status: spontaneous breathing, nonlabored ventilation and respiratory function stable Cardiovascular status: blood pressure returned to baseline and stable Postop Assessment: no signs of nausea or vomiting Anesthetic complications: no    Last Vitals:  Vitals:   04/18/16 1548 04/18/16 1825  BP: 132/67 125/67  Pulse: 72 75  Resp: 16 16  Temp: 36.6 C 36.1 C    Last Pain:  Vitals:   04/18/16 1825  TempSrc: Oral  PainSc:                  Aundre Hietala,W. EDMOND

## 2016-04-18 NOTE — Anesthesia Procedure Notes (Signed)
Procedure Name: Intubation Performed by: Judeth Cornfield T Pre-anesthesia Checklist: Patient identified, Emergency Drugs available, Suction available and Patient being monitored Patient Re-evaluated:Patient Re-evaluated prior to inductionOxygen Delivery Method: Circle system utilized Preoxygenation: Pre-oxygenation with 100% oxygen Intubation Type: IV induction Ventilation: Mask ventilation without difficulty and Oral airway inserted - appropriate to patient size Laryngoscope Size: Mac and 3 Grade View: Grade III Tube type: Oral Tube size: 7.5 mm Number of attempts: 1 Airway Equipment and Method: Stylet Placement Confirmation: ETT inserted through vocal cords under direct vision,  positive ETCO2,  CO2 detector and breath sounds checked- equal and bilateral Secured at: 23 cm Tube secured with: Tape Dental Injury: Teeth and Oropharynx as per pre-operative assessment

## 2016-04-18 NOTE — Transfer of Care (Signed)
Immediate Anesthesia Transfer of Care Note  Patient: Mitchell [name]  Procedure(s) Performed: Procedure(s): LAPAROSCOPIC BILATERAL INGUINAL HERNIA REPAIR (Bilateral) INSERTION OF MESH (Bilateral)  Patient Location: PACU  Anesthesia Type:General  Level of Consciousness: patient cooperative and responds to stimulation  Airway & Oxygen Therapy: Patient Spontanous Breathing and Patient connected to nasal cannula oxygen  Post-op Assessment: Report given to RN and Post -op Vital signs reviewed and stable  Post vital signs: Reviewed and stable  Last Vitals:  Vitals:   04/18/16 1135  Pulse: 70  Resp: 20  Temp: 36.5 C    Last Pain:  Vitals:   04/18/16 1135  TempSrc: Oral         Complications: No apparent anesthesia complications

## 2016-04-18 NOTE — Op Note (Signed)
LAPAROSCOPIC BILATERAL INGUINAL HERNIA REPAIR, INSERTION OF MESH  Procedure Note  Mitchell [name] 04/18/2016   Pre-op Diagnosis: Bilateral inguinal hernias     Post-op Diagnosis: same  Procedure(s): LAPAROSCOPIC BILATERAL INGUINAL HERNIA REPAIR INSERTION OF MESH  Surgeon(s): Coralie Keens, MD  Anesthesia: General  Staff:  Circulator: Beryle Lathe, RN Relief Scrub: Rosanne Sack, RN Scrub Person: Rosanne Sack, RN; [place] Circulator Assistant: Rosanne Sack, RN  Estimated Blood Loss: Minimal               Findings:  Bilateral direct inguinal hernia          Mitchell [name]   Date: 04/18/2016  Time: 1:29 PM

## 2016-04-19 ENCOUNTER — Encounter (HOSPITAL_COMMUNITY): Payer: Self-pay | Admitting: Surgery

## 2016-04-19 DIAGNOSIS — K4020 Bilateral inguinal hernia, without obstruction or gangrene, not specified as recurrent: Secondary | ICD-10-CM | POA: Diagnosis not present

## 2016-04-19 LAB — GLUCOSE, CAPILLARY: Glucose-Capillary: 134 mg/dL — ABNORMAL HIGH (ref 65–99)

## 2016-04-19 MED ORDER — OXYCODONE-ACETAMINOPHEN 5-325 MG PO TABS: 1.0000 | ORAL_TABLET | ORAL | 0 refills | Status: DC | PRN

## 2016-04-19 NOTE — Discharge Summary (Signed)
Physician Discharge Summary  Patient ID: Mitchell [name] MRN: [id number] DOB/AGE: 11-09-1945 70 y.o.  Admit date: 04/18/2016 Discharge date: 04/19/2016  Admission Diagnoses:  Discharge Diagnoses:  Active Problems:   S/P bilateral inguinal hernia repair   Discharged Condition: good  Hospital Course: uneventful post op recovery.  Discharged home POD#1  Consults: None  Significant Diagnostic Studies:   Treatments: surgery: bilateral laparoscopic inguinal hernia repair with mesh  Discharge Exam: Blood pressure 135/66, pulse 91, temperature 98.2 F (36.8 C), temperature source Axillary, resp. rate 18, height 6' (1.829 m), weight 84.4 kg (186 lb), SpO2 98 %. Generally comfortable, no distress Lungs clear CV RRR Abdomen soft, incisions clean  Disposition: 01-Home or Self Care     Medication List    TAKE these medications   amLODipine 10 MG tablet Commonly known as:  NORVASC Take 10 mg by mouth daily.   aspirin 81 MG chewable tablet Chew 81 mg by mouth at bedtime.   atorvastatin 40 MG tablet Commonly known as:  LIPITOR Take 40 mg by mouth at bedtime.   benazepril 40 MG tablet Commonly known as:  LOTENSIN Take 40 mg by mouth daily.   chlorthalidone 25 MG tablet Commonly known as:  HYGROTON Take 1 tablet (25 mg total) by mouth daily.   cholecalciferol 1000 units tablet Commonly known as:  VITAMIN D Take 1,000 Units by mouth daily.   clopidogrel 75 MG tablet Commonly known as:  PLAVIX Take 1 tablet (75 mg total) by mouth daily.   Coenzyme Q10 200 MG capsule Take 200 mg by mouth at bedtime.   COLACE PO Take 1 capsule by mouth 2 (two) times daily as needed (for constipation).   COREG 25 MG tablet Generic drug:  carvedilol Take 1 tablet (25 mg total) by mouth 2 (two) times daily.   FISH OIL TRIPLE STRENGTH 1400 MG Caps Take 1,400 mg by mouth every morning.   glipiZIDE 10 MG tablet Commonly known as:  GLUCOTROL Take 5 mg by mouth 2 (two) times  daily.   metFORMIN 500 MG (MOD) 24 hr tablet Commonly known as:  GLUMETZA Take 500 mg by mouth every evening.   multivitamin tablet Take 1 tablet by mouth daily.   nortriptyline 50 MG capsule Commonly known as:  PAMELOR Take 50 mg by mouth at bedtime.   oxyCODONE-acetaminophen 5-325 MG tablet Commonly known as:  PERCOCET/ROXICET Take 1-2 tablets by mouth every 4 (four) hours as needed. What changed:  how much to take  reasons to take this   potassium chloride SA 20 MEQ tablet Commonly known as:  K-DUR,KLOR-CON Take 1 tablet (20 mEq total) by mouth 3 (three) times daily.   sildenafil 100 MG tablet Commonly known as:  VIAGRA Take 100 mg by mouth daily as needed for erectile dysfunction.   tiotropium 18 MCG inhalation capsule Commonly known as:  SPIRIVA Place 18 mcg into inhaler and inhale daily.   traZODone 100 MG tablet Commonly known as:  DESYREL Take 100 mg by mouth at bedtime.      Follow-up Information    Anayelli Lai A, MD. Schedule an appointment as soon as possible for a visit in 3 week(s).   Specialty:  General Surgery Contact information: [address] [place] [address] [address] [place] [address] [phone]           Signed: Harl Bowie 04/19/2016, 6:35 AM

## 2016-04-19 NOTE — Op Note (Signed)
NAMEMarland [name]  BENNEY, YONEDA NO.:  [id number]  MEDICAL RECORD NO.:  [id number]  LOCATION:  [id number]                        FACILITY:  [facility]  PHYSICIAN:  Coralie Keens, M.D. DATE OF BIRTH:  01-Oct-1945  DATE OF PROCEDURE:  04/18/2016 DATE OF DISCHARGE:                              OPERATIVE REPORT   PREOPERATIVE DIAGNOSIS:  Bilateral inguinal hernias.  POSTOPERATIVE DIAGNOSIS:  Bilateral inguinal hernias.  PROCEDURE:  Bilateral laparoscopic inguinal hernia repair with mesh.  SURGEON:  Coralie Keens, M.D.  ANESTHESIA:  General and 0.5% Marcaine.  ESTIMATED BLOOD LOSS:  Minimal.  FINDINGS:  The patient was found to have bilateral direct inguinal hernias, which were repaired with 2 separate pieces of Bard 3DMax Prolene mesh.  PROCEDURE IN DETAIL:  The patient was brought to the operating room, identified as Mitchell [name].  He was placed supine on the operating room table, and general anesthesia was induced.  His abdomen was then prepped and draped in usual sterile fashion.  I made a small vertical incision just below the umbilicus.  I took this down to the fascia, which opened just to the right of the midline.  The rectus muscle was identified and elevated.  The dissecting balloon was then passed underneath the rectus sheath and manipulated toward the pubis.  The dissecting balloon was then insufflated under direct vision dissecting out the preperitoneal space.  The dissecting balloon was then removed and insufflation begun with carbon dioxide.  I then placed two 5-mm ports in the patient's lower midline, both under direct vision.  I dissected out the right inguinal area first.  The patient had a small direct hernia defect.  I evaluated the cord structures and found no evidence of indirect hernia.  I then turned my attention to the left inguinal area, a much larger direct hernia sac on the left, which I was able to reduce the contents from the sac.  I then  evaluated the cord structures on the left side and found no indirect hernia.  At this point, I brought a piece of left-sided Prolene Bard 3DMax mesh onto the field.  I placed it through the umbilical trocar and opened as onlay on the left inguinal floor.  I then tacked the Cooper's ligament up the medial abdominal wall and then slightly laterally.  I then brought a right-sided piece of large Bard 3DMax mesh on the field as well.  I placed it through the trocar at the umbilicus as well and opened as an onlay on the right inguinal floor.  I then tacked the Cooper's ligament up the medial abdominal wall and slightly laterally.  Wide coverage of both fascial defects and cord structures appeared to be achieved.  At this point, the trocars were removed, and preperitoneal space seem to collapse appropriately.  Hemostasis appeared to be achieved.  I then closed the fascia at the umbilicus with 0 Vicryl figure-of-eight suture. All incisions were anesthetized with Marcaine.  I performed bilateral ilioinguinal nerve blocks with Marcaine as well.  I then closed all skin incisions with 4-0 Monocryl.  Skin glue was then applied.  The patient tolerated the procedure well.  All the counts were correct at the end of procedure.  The patient was then extubated in the operating room and taken in stable condition to recovery room.     Coralie Keens, M.D.     DB/MEDQ  D:  04/18/2016  T:  04/19/2016  Job:  [id number]

## 2016-04-19 NOTE — Progress Notes (Signed)
Patient ID: Mitchell [name], male   DOB: Sep 13, 1945, 70 y.o.   MRN: [id number]   Doing well this morning Only mild post op discomfort Abdomen soft  Plan: Discharge home

## 2016-04-19 NOTE — Progress Notes (Signed)
AVS given to patient. IV removed. Understanding verbalized. Transportation arranged by patient. Belongings packed.

## 2016-04-19 NOTE — Discharge Instructions (Signed)
[facility] _______[facility]  UMBILICAL OR INGUINAL HERNIA REPAIR: POST OP INSTRUCTIONS  Always review your discharge instruction sheet given to you by the facility where your surgery was performed. IF YOU HAVE DISABILITY OR FAMILY LEAVE FORMS, YOU MUST BRING THEM TO THE OFFICE FOR PROCESSING.   DO NOT GIVE THEM TO YOUR DOCTOR.  1. A  prescription for pain medication may be given to you upon discharge.  Take your pain medication as prescribed, if needed.  If narcotic pain medicine is not needed, then you may take acetaminophen (Tylenol) or ibuprofen (Advil) as needed. 2. Take your usually prescribed medications unless otherwise directed. If you need a refill on your pain medication, please contact your pharmacy.  They will contact our office to request authorization. Prescriptions will not be filled after 5 pm or on week-ends. 3. You should follow a light diet the first 24 hours after arrival home, such as soup and crackers, etc.  Be sure to include lots of fluids daily.  Resume your normal diet the day after surgery. 4.Most patients will experience some swelling and bruising around the umbilicus or in the groin and scrotum.  Ice packs and reclining will help.  Swelling and bruising can take several days to resolve.  6. It is common to experience some constipation if taking pain medication after surgery.  Increasing fluid intake and taking a stool softener (such as Colace) will usually help or prevent this problem from occurring.  A mild laxative (Milk of Magnesia or Miralax) should be taken according to package directions if there are no bowel movements after 48 hours. 7. Unless discharge instructions indicate otherwise, you may remove your bandages 24-48 hours after surgery, and you may shower at that time.  You may have steri-strips (small skin tapes) in place directly over the incision.  These strips should be left on the skin for 7-10 days.  If your surgeon used skin glue on the  incision, you may shower in 24 hours.  The glue will flake off over the next 2-3 weeks.  Any sutures or staples will be removed at the office during your follow-up visit. 8. ACTIVITIES:  You may resume regular (light) daily activities beginning the next day--such as daily self-care, walking, climbing stairs--gradually increasing activities as tolerated.  You may have sexual intercourse when it is comfortable.  Refrain from any heavy lifting or straining until approved by your doctor.  a.You may drive when you are no longer taking prescription pain medication, you can comfortably wear a seatbelt, and you can safely maneuver your car and apply brakes. b.RETURN TO WORK:   _____________________________________________  9.You should see your doctor in the office for a follow-up appointment approximately 2-3 weeks after your surgery.  Make sure that you call for this appointment within a day or two after you arrive home to insure a convenient appointment time. 10.OTHER INSTRUCTIONS: _NO LIFTING MORE THAN 15 POUNDS FOR 3 WEEKS________________________    _____________________________________  WHEN TO CALL YOUR DOCTOR: 1. Fever over 101.0 2. Inability to urinate 3. Nausea and/or vomiting 4. Extreme swelling or bruising 5. Continued bleeding from incision. 6. Increased pain, redness, or drainage from the incision  The clinic staff is available to answer your questions during regular business hours.  Please dont hesitate to call and ask to speak to one of the nurses for clinical concerns.  If you have a medical emergency, go to the nearest emergency room or call 911.  A surgeon from [facility] is  always on call at the hospital   [address], [place], [place], [place]  [address] ?  [address], [place], [place]   [address] [phone] ? [phone] ? FAX [phone] Web site: [email or web address]

## 2016-04-30 ENCOUNTER — Other Ambulatory Visit: Payer: Self-pay | Admitting: Physician Assistant

## 2016-04-30 DIAGNOSIS — I2510 Atherosclerotic heart disease of native coronary artery without angina pectoris: Secondary | ICD-10-CM

## 2016-08-25 IMAGING — CR DG CHEST 2V
2 series · 2 of 2 positions shown · non-contrast
Comparison: 12/11/2012 .

CLINICAL DATA: Rotator cuff surgery.

EXAM:
CHEST  2 VIEW

[view not recorded (1 of 2)]
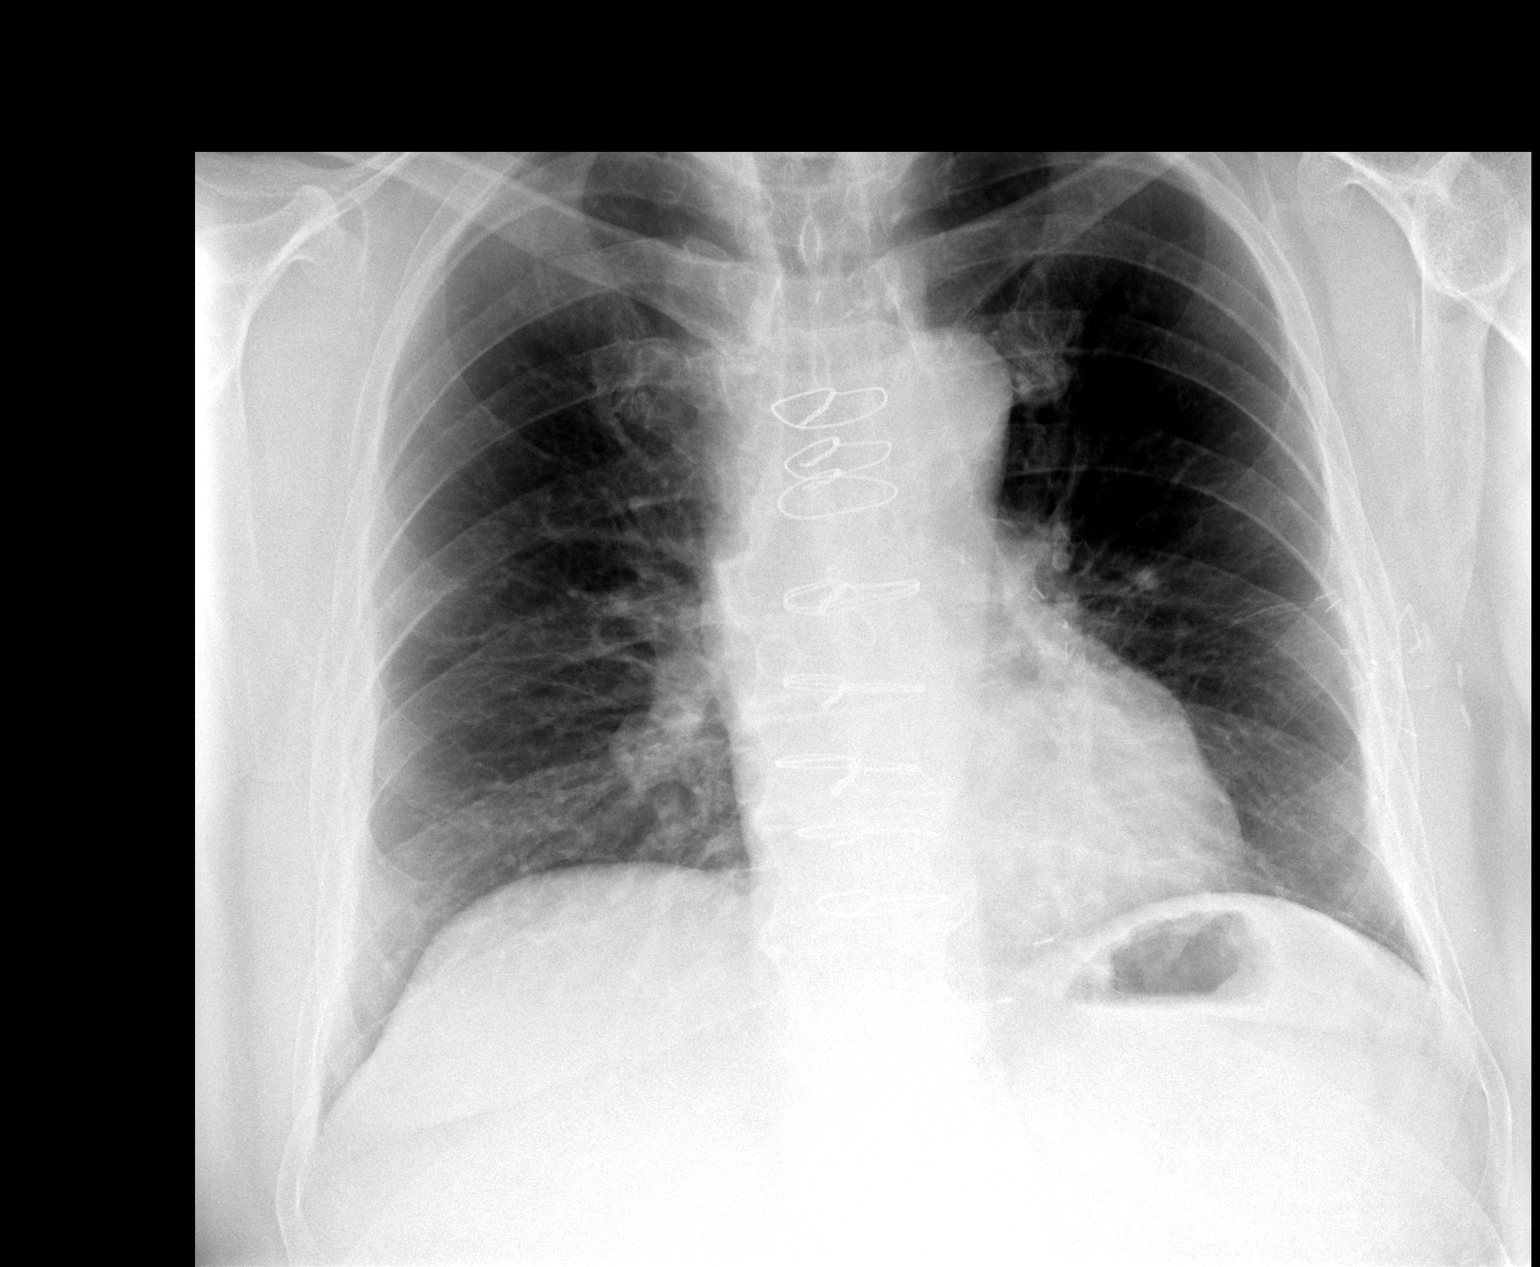

[view not recorded (2 of 2)]
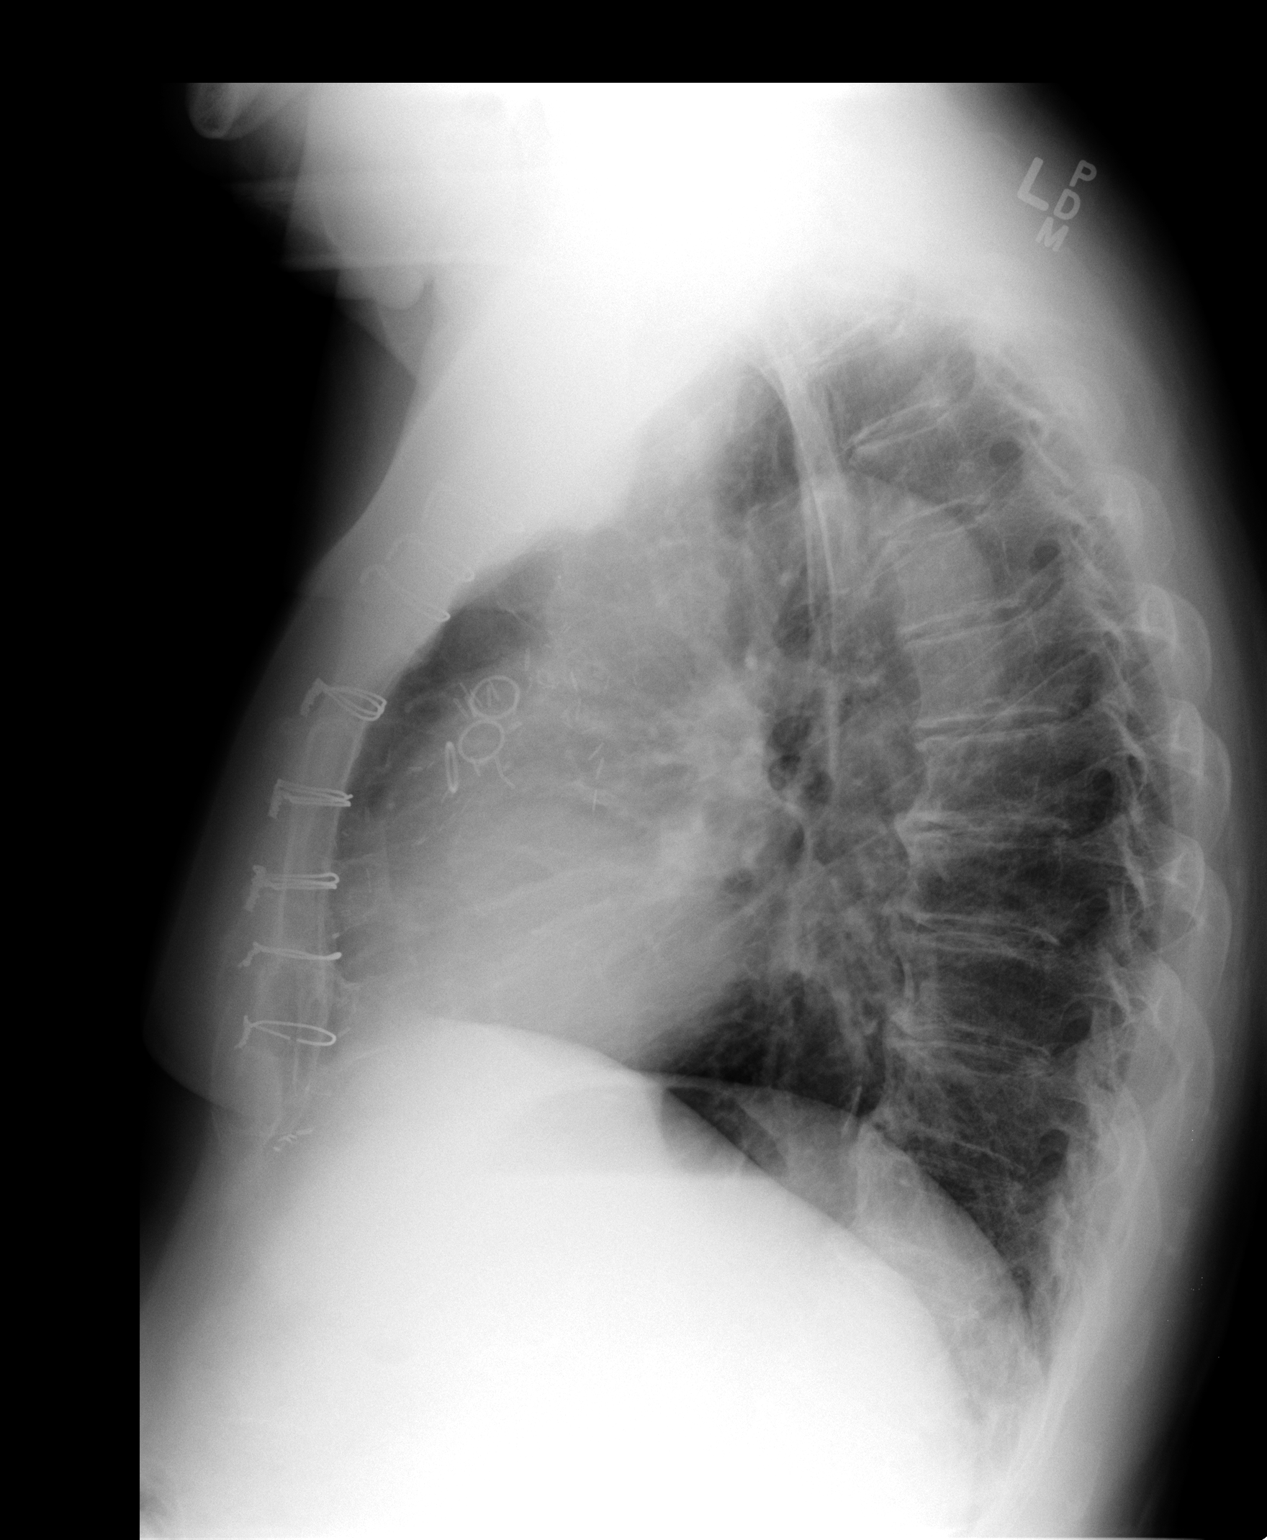

[2 of 2 positions shown; findings below may reference images not displayed]

FINDINGS: Prior CABG. Mediastinum and hilar structures normal. Heart size
stable. No focal infiltrate. No pleural effusion or pneumothorax.
Degenerative changes thoracic spine. Surgical clips left chest.
IMPRESSION: No acute cardiopulmonary disease.  Prior CABG.

## 2016-08-29 ENCOUNTER — Ambulatory Visit (HOSPITAL_COMMUNITY)
Admission: RE | Admit: 2016-08-29 | Discharge: 2016-08-29 | Disposition: A | Discharge status: Home / Self Care | Payer: Medicare Other | Source: Ambulatory Visit | Attending: Cardiology | Admitting: Cardiology

## 2016-08-29 DIAGNOSIS — I2510 Atherosclerotic heart disease of native coronary artery without angina pectoris: Secondary | ICD-10-CM | POA: Insufficient documentation

## 2016-08-29 DIAGNOSIS — I6523 Occlusion and stenosis of bilateral carotid arteries: Secondary | ICD-10-CM | POA: Insufficient documentation

## 2016-09-18 ENCOUNTER — Telehealth: Payer: Self-pay | Admitting: Internal Medicine

## 2016-09-18 NOTE — Telephone Encounter (Signed)
Patient is a former patient of Dr. Aundra Dubin and is scheduled to see Dr. Saunders Revel on 10-17-16 and only wanted to see Dr. Saunders Revel .  Pt c/o BP issue: STAT if pt c/o blurred vision, one-sided weakness or slurred speech  1. What are your last 5 BP readings?  2. Are you having any other symptoms (ex. Dizziness, headache, blurred vision, passed out)? Dizziness  3. What is your BP issue? Patient states that his BP has been dropping for 6 months now. I was advised to see his cardiologist by his PCP.  Thanks.

## 2016-09-18 NOTE — Telephone Encounter (Signed)
Spoke with pt and he states for several months now he has been having episodes of dizziness associated with position changes.  When pt stands up and walks to another room he will become dizzy sometimes.  Has been monitoring his BP more and it has been running low more often than it is normal.  Lowest his BP has gotten is 72/40.  Pt seen PCP Monday and they recommended he see Cardiology to see if meds need to be adjusted.  Pt denies passing out but has felt like he was going to on occasion. When dizziness occurs pt sits down and it resolves quickly.  Moved May appt up to 09/20/16 with Dr. Saunders Revel.  Advised pt if sx worsen or he does pass out, he needs to go to ER.  Pt verbalized understanding and was appreciative for assistance.

## 2016-09-20 ENCOUNTER — Encounter: Payer: Self-pay | Admitting: Internal Medicine

## 2016-09-20 ENCOUNTER — Ambulatory Visit (INDEPENDENT_AMBULATORY_CARE_PROVIDER_SITE_OTHER): Payer: Medicare Other | Admitting: Internal Medicine

## 2016-09-20 VITALS — BP 120/70 | HR 64 | Ht 72.0 in | Wt 179.4 lb

## 2016-09-20 DIAGNOSIS — I10 Essential (primary) hypertension: Secondary | ICD-10-CM | POA: Diagnosis not present

## 2016-09-20 DIAGNOSIS — I714 Abdominal aortic aneurysm, without rupture, unspecified: Secondary | ICD-10-CM

## 2016-09-20 DIAGNOSIS — I951 Orthostatic hypotension: Secondary | ICD-10-CM | POA: Diagnosis not present

## 2016-09-20 DIAGNOSIS — I739 Peripheral vascular disease, unspecified: Secondary | ICD-10-CM

## 2016-09-20 DIAGNOSIS — I779 Disorder of arteries and arterioles, unspecified: Secondary | ICD-10-CM | POA: Diagnosis not present

## 2016-09-20 DIAGNOSIS — I2510 Atherosclerotic heart disease of native coronary artery without angina pectoris: Secondary | ICD-10-CM | POA: Diagnosis not present

## 2016-09-20 DIAGNOSIS — E782 Mixed hyperlipidemia: Secondary | ICD-10-CM

## 2016-09-20 NOTE — Patient Instructions (Signed)
Medication Instructions:  Stop chlorthalidone.   Labwork: None   Testing/Procedures: None   Follow-Up: Your physician recommends that you schedule a follow-up appointment in: 1 month with Dr End.        If you need a refill on your cardiac medications before your next appointment, please call your pharmacy.

## 2016-09-20 NOTE — Progress Notes (Addendum)
Patient ID: Mitchell [name], male   DOB: 12-16-1945, 71 y.o.   MRN: [id number] PCP: Mitchell [name]  Date of Visit: 09/20/2016  71 y.o. with h/o CAD s/p CABG, aortic aneurysm s/p repair, HTN, DM2 returns to cardiology clinic.  In 6/14 he had EVAR for repair of AAA.  Followup CTs have shown a type II endoleak that is being followed by Mitchell [name].  He is seen today due to orthostatic lightheadedness that has progressed over the last several months. He attributes this to losing 30 pounds over the last 1.5 years. He checks his BP when he feels lightheaded and has noted it to be as low as 72/40. He notes low blood pressure issues several years ago, prompting discontinuation of all antihypertensive medications. They were subsequently restarted due to rebound hypertension. He has not passed out or fallen.  Mitchell [name] denies chest pain and has stable exertional dyspnea. No edema or palpitations. He continues to have some right groin pain following hernia surgery and is currently undergoing physical therapy. No recent medication changes to explain lightheadedness and low blood pressure. Of note, patient was asymptomatic leading up to MI/CABG in 2008.  Labs (8/10): K 3.8, creatinine 1.12, LDL 61, HDL 29 Labs (3/11): HDL 32, LDL 55, LFTs normal Labs (10/11): LDL 55, HDL 27, K 3.9, creatinine 1.0 Labs (4/12): LDL 53, HDL 27 Labs (10/12): LDL 49, HDL 39 Labs (4/13): LDL 42, HDL 31 Labs (10/13): K 3.7, creatinine 1.3 Labs (3/14): LDL 60, HDL 21 Labs (4/14): K 3.7, creatinine 1.2 Labs (6/14): K 3, creatinine 1  Labs (3/15): LDL 63, HDL 29 Labs (7/15): creatinine 1.2 Labs (8/15): BNP normal Labs (10/16): LDL 77 Labs (5/17): K 4.3, creatinine 1.4 Labs (9/17): K 3.3, creatinine 1.3, LDL 59, triglycerides 144, HDL 32 Labs (10/17): K 3.7, creatinine 1.5, ALT 18  ECG: NSR, 1st degree AV block (PR 236 ms).  Allergies (verified):  No Known Drug Allergies  Past Medical History: 1. Coronary artery disease.  The  patient had non-ST-elevation MI in February 2008 and there was an 80-90% proximal LAD stenosis, 90%proximal first diagonal stenosis, 99% ostial ramus stenosis.  The first obtuse marginal was subtotally occluded and there was an 80% mid RCA stenosis.  EF was estimated 25% on ventriculogram, coronary artery bypass grafting was done with a LIMA to the LAD, sequential saphenous vein graft to the PDA, and PLOM, saphenous graft to the diagonal, saphenous vein graft to the ramus. - Cardiolite (12/15) with EF 52%, no ischemia.  2. Aortic insufficiency: Echo (3/11): EF 55-60%, moderate diastolic dysfunction, moderate aortic insufficiency, moderate LAE.  Echo (3/12) with EF 50-55%, grade II diastolic dysfunction, mild MR, mild aortic insufficiency.  Echo (9/15) with EF 55-60%, mild AI, mild MR, mildly dilated RV with normal systolic function.  3. Infrarenal AAA. Abdominal US 3/11 with AAA 3.9 x 3.9 cm.  Abdominal US 3/12 with AAA 3.8 x 3.9 cm.  Abdominal US (3/13) with AAA 4.6 x 3.9 with complex plaque within the AAA.  CTA abdomen showed 4 cm saccular infrarenal aneurysm.  CTA abdomen 5/14 showed saccular AAA grown to 5.3 cm. EVAR for repair in 6/14.  4. Hypertension: Renal artery dopplers in 5/13 with no significant renal artery stenosis.  5. Type 2 diabetes. 6. History of torn left rotator cuff. 7. History of obstructive/restrictive PFTs.  There is some reversibility with bronchodilator.  The patient actually never smoked.  He is on Spiriva. 8. Holter (3/10):  Frequent PACs and blocked PACs.  PVCs with trigeminy.  No long pauses.  Slowest HR in 40s while asleep.  9. Carotid stenosis.  Carotid dopplers (3/12) with stable 40-59% bilateral ICA stenosis.  Carotid dopplers (3/13) with 40-59% bilateral stenosis. Carotid dopplers (3/14) with 40-59% bilateral stenosis. Carotid dopplers (3/15) with 40-59% bilateral ICA stenosis. - Carotid dopplers (3/17): 40-59% BICA stenosis.  - Carotid dopplers (3/18): 1-39% BICA  stenosis. >50% stenosis in BECAs. 10.  Melanoma: s/p excision from back 5/12.   Family History: Both the patient's mother and father had abdominal aortic aneurysm and both died due to complications with attempted AAA repair.  Social History: The patient is a lifetime nonsmoker. He is unemployed but draws a pension from the [employer].  He lives by himself.  He has no close family left, he only has cousins. He plays the piano in church.   ROS:  All systems reviewed and negative except as per HPI.    Current Outpatient Prescriptions  Medication Sig Dispense Refill  . amLODipine (NORVASC) 10 MG tablet Take 10 mg by mouth daily.     Marland Kitchen aspirin 81 MG chewable tablet Chew 81 mg by mouth at bedtime.     Marland Kitchen atorvastatin (LIPITOR) 40 MG tablet Take 40 mg by mouth at bedtime.     . benazepril (LOTENSIN) 40 MG tablet Take 40 mg by mouth daily.     . carvedilol (COREG) 25 MG tablet Take 1 tablet (25 mg total) by mouth 2 (two) times daily.    . chlorthalidone (HYGROTON) 25 MG tablet Take 1 tablet (25 mg total) by mouth daily. 90 tablet 3  . cholecalciferol (VITAMIN D) 1000 UNITS tablet Take 1,000 Units by mouth daily.    . clopidogrel (PLAVIX) 75 MG tablet TAKE ONE TABLET BY MOUTH ONCE DAILY 90 tablet 2  . Coenzyme Q10 200 MG capsule Take 200 mg by mouth at bedtime.     Mitchell [name] Sodium (COLACE PO) Take 1 capsule by mouth 2 (two) times daily as needed (for constipation).     Marland Kitchen glipiZIDE (GLUCOTROL) 10 MG tablet Take 5 mg by mouth 2 (two) times daily.     . metFORMIN (GLUMETZA) 500 MG (MOD) 24 hr tablet Take 500 mg by mouth every evening.     . Multiple Vitamin (MULTIVITAMIN) tablet Take 1 tablet by mouth daily.      . nortriptyline (PAMELOR) 50 MG capsule Take 50 mg by mouth at bedtime.    . Omega-3 Fatty Acids (FISH OIL TRIPLE STRENGTH) 1400 MG CAPS Take 1,400 mg by mouth every morning.    Marland Kitchen oxyCODONE-acetaminophen (PERCOCET/ROXICET) 5-325 MG tablet Take 1-2 tablets by mouth every 4 (four) hours as needed.  40 tablet 0  . potassium chloride SA (K-DUR,KLOR-CON) 20 MEQ tablet Take 1 tablet (20 mEq total) by mouth 3 (three) times daily.    . sildenafil (VIAGRA) 100 MG tablet Take 100 mg by mouth daily as needed for erectile dysfunction.    Marland Kitchen tiotropium (SPIRIVA) 18 MCG inhalation capsule Place 18 mcg into inhaler and inhale daily.      . traZODone (DESYREL) 100 MG tablet Take 100 mg by mouth at bedtime.      No current facility-administered medications for this visit.      BP 120/70 (BP Location: Left Arm, Patient Position: Sitting, Cuff Size: Normal)   Pulse 64   Ht 6' (1.829 m)   Wt 179 lb 6.4 oz (81.4 kg)   SpO2 97%   BMI 24.33 kg/m   Position Blood pressure (mmHg)  Heart rate (bpm)  Lying 122/71 63  Sitting 108/68 76  Standing 93/61 79  Standing (3 minutes) 99/65 78   General:  Well developed, well nourished, in no acute distress. Neck:  Neck supple, no JVD. No masses, thyromegaly or abnormal cervical nodes. Lungs:  Faint left basilar crackles. Normal work of breathing. No wheezes. Heart:  RRR without murmus or rubs. S4 present. Non-displaced PMI. Well-headed median sternotomy scar. Abdomen:  Bowel sounds positive; abdomen soft and non-tender without masses, organomegaly, or hernias noted. No hepatosplenomegaly. Extremities:  2+ right radial, trace left radial pulses. 2+ pedal pulses. No LE edema. Neurologic:  Alert and oriented x 3. Psych:  Normal affect.  Assessment/Plan:  ORTHOSTATIC HYPOTENSION Suspect due to overmedication. Will stop chlorthalidone and have patient increase fluid intake. If symptoms persist, will need to consider further deescalation of BP medications and consider repeat echo. CORONARY ARTERY DISEASE No ischemic symptoms. Last Cardiolite was in 12/15, no ischemia.  - Continue ASA, Plavix, statin, Coreg, ACEI. HYPERLIPIDEMIA Lipids at goal in 9/17. Continue statin. AAA  S/p repair by Mitchell [name] (EVAR). Has type II Endoleak, being followed.  CAROTID ARTERY  STENOSIS  Stable moderate carotid stenosis. Repeat carotid dopplers in 1 year (3/19) HYPERTENSION Significant orthostatic hypotension, as above. Will stop chlorthalidone and continue remainder of medications.  Follow-up: Return to clinic in 1 month.   Liberty Seto 09/21/2016

## 2016-09-21 ENCOUNTER — Encounter: Payer: Self-pay | Admitting: Internal Medicine

## 2016-09-21 DIAGNOSIS — I779 Disorder of arteries and arterioles, unspecified: Secondary | ICD-10-CM | POA: Insufficient documentation

## 2016-09-21 DIAGNOSIS — I739 Peripheral vascular disease, unspecified: Secondary | ICD-10-CM

## 2016-10-17 ENCOUNTER — Ambulatory Visit: Payer: Medicare Other | Admitting: Internal Medicine

## 2016-10-24 ENCOUNTER — Encounter: Payer: Self-pay | Admitting: Internal Medicine

## 2016-10-24 ENCOUNTER — Ambulatory Visit (INDEPENDENT_AMBULATORY_CARE_PROVIDER_SITE_OTHER): Payer: Medicare Other | Admitting: Internal Medicine

## 2016-10-24 VITALS — BP 120/82 | HR 70 | Ht 72.0 in | Wt 182.8 lb

## 2016-10-24 DIAGNOSIS — E785 Hyperlipidemia, unspecified: Secondary | ICD-10-CM | POA: Diagnosis not present

## 2016-10-24 DIAGNOSIS — I714 Abdominal aortic aneurysm, without rupture, unspecified: Secondary | ICD-10-CM

## 2016-10-24 DIAGNOSIS — I10 Essential (primary) hypertension: Secondary | ICD-10-CM | POA: Diagnosis not present

## 2016-10-24 DIAGNOSIS — I6523 Occlusion and stenosis of bilateral carotid arteries: Secondary | ICD-10-CM

## 2016-10-24 DIAGNOSIS — R42 Dizziness and giddiness: Secondary | ICD-10-CM

## 2016-10-24 DIAGNOSIS — I2510 Atherosclerotic heart disease of native coronary artery without angina pectoris: Secondary | ICD-10-CM

## 2016-10-24 NOTE — Progress Notes (Signed)
Follow-up Outpatient Visit Date: 10/24/2016  Primary Care Provider: Leonard Downing, MD [place] [address]  Chief Complaint: Follow-up lightheadedness  HPI:  Mr. Disano is a 71 y.o. year-old male with history of coronary artery disease status post CABG, AAA status post repair, hypertension, and type 2 diabetes mellitus, who presents for follow-up of orthostatic hypotension. I saw him one month ago, which time he reported orthostatic lightheadedness and low blood pressure readings at home. He was on multiple antihypertensive agents at the time, prompting [place] to discontinue chlorthalidone. Since that time, his lightheadedness has been much improved. He still notes a little bit of dizziness from time to time, but states that this has been present for decades. His home blood pressures are typically around 110/60-70. He has not had any chest pain, palpitations, or leg edema. He has chronic exertional dyspnea, that is been unchanged for years. He is still struggling with intermittent pain involving the right groin following hernia surgery last year. He also notes occasional paresthesias involving the left hand and arm, which has been present for years.  --------------------------------------------------------------------------------------------------  Pertinent Past Medical History: 1. Coronary artery disease.  The patient had non-ST-elevation MI in February 2008 and there was an 80-90% proximal LAD stenosis, 90%proximal first diagonal stenosis, 99% ostial ramus stenosis.  The first obtuse marginal was subtotally occluded and there was an 80% mid RCA stenosis.  EF was estimated 25% on ventriculogram, coronary artery bypass grafting was done with a LIMA to the LAD, sequential saphenous vein graft to the PDA, and PLOM, saphenous graft to the diagonal, saphenous vein graft to the ramus. - Cardiolite (12/15) with EF 52%, no ischemia.  2. Aortic insufficiency: Echo (3/11): EF  55-60%, moderate diastolic dysfunction, moderate aortic insufficiency, moderate LAE.  Echo (3/12) with EF 50-55%, grade II diastolic dysfunction, mild MR, mild aortic insufficiency.  Echo (9/15) with EF 55-60%, mild AI, mild MR, mildly dilated RV with normal systolic function.  3. Infrarenal AAA. Abdominal US 3/11 with AAA 3.9 x 3.9 cm.  Abdominal US 3/12 with AAA 3.8 x 3.9 cm.  Abdominal US (3/13) with AAA 4.6 x 3.9 with complex plaque within the AAA.  CTA abdomen showed 4 cm saccular infrarenal aneurysm.  CTA abdomen 5/14 showed saccular AAA grown to 5.3 cm. EVAR for repair in 6/14.  4. Hypertension: Renal artery dopplers in 5/13 with no significant renal artery stenosis.  5. Type 2 diabetes. 6. History of torn left rotator cuff. 7. History of obstructive/restrictive PFTs.  There is some reversibility with bronchodilator.  The patient actually never smoked.  He is on Spiriva. 8. Holter (3/10):  Frequent PACs and blocked PACs.  PVCs with trigeminy.  No long pauses.  Slowest HR in 40s while asleep.  9. Carotid stenosis.  Carotid dopplers (3/12) with stable 40-59% bilateral ICA stenosis.  Carotid dopplers (3/13) with 40-59% bilateral stenosis. Carotid dopplers (3/14) with 40-59% bilateral stenosis. Carotid dopplers (3/15) with 40-59% bilateral ICA stenosis. - Carotid dopplers (3/17): 40-59% BICA stenosis.  - Carotid dopplers (3/18): 1-39% BICA stenosis. >50% stenosis in BECAs. 10.  Melanoma: s/p excision from back 5/12.   Recent CV Pertinent Labs: Lab Results  Component Value Date   CHOL 120 (L) 03/04/2016   HDL 32 (L) 03/04/2016   LDLCALC 59 03/04/2016   TRIG 144 03/04/2016   CHOLHDL 3.8 03/04/2016   INR 1.06 12/11/2012   K 3.8 10/24/2016   K 4.3 10/23/2015   MG 2.1 12/11/2012   BUN 16  10/24/2016   BUN 19.6 10/23/2015   CREATININE 1.09 10/24/2016   CREATININE 1.26 (H) 03/04/2016   CREATININE 1.4 (H) 10/23/2015    Past medical and surgical history were reviewed and updated in  EPIC.  Outpatient Encounter Prescriptions as of 10/24/2016  Medication Sig  . amLODipine (NORVASC) 10 MG tablet Take 10 mg by mouth daily.   Marland Kitchen aspirin 81 MG chewable tablet Chew 81 mg by mouth at bedtime.   Marland Kitchen atorvastatin (LIPITOR) 40 MG tablet Take 40 mg by mouth at bedtime.   . benazepril (LOTENSIN) 40 MG tablet Take 40 mg by mouth daily.   . carvedilol (COREG) 25 MG tablet Take 1 tablet (25 mg total) by mouth 2 (two) times daily.  . cholecalciferol (VITAMIN D) 1000 UNITS tablet Take 1,000 Units by mouth daily.  . clopidogrel (PLAVIX) 75 MG tablet TAKE ONE TABLET BY MOUTH ONCE DAILY  . Coenzyme Q10 200 MG capsule Take 200 mg by mouth at bedtime.   [name] Sodium (COLACE PO) Take 1 capsule by mouth 2 (two) times daily as needed (for constipation).   Marland Kitchen glipiZIDE (GLUCOTROL) 10 MG tablet Take 5 mg by mouth 2 (two) times daily.   . metFORMIN (GLUMETZA) 500 MG (MOD) 24 hr tablet Take 500 mg by mouth every evening.   . Multiple Vitamin (MULTIVITAMIN) tablet Take 1 tablet by mouth daily.    . nortriptyline (PAMELOR) 50 MG capsule Take 50 mg by mouth at bedtime.  . Omega-3 Fatty Acids (FISH OIL TRIPLE STRENGTH) 1400 MG CAPS Take 1,400 mg by mouth every morning.  Marland Kitchen oxyCODONE-acetaminophen (PERCOCET/ROXICET) 5-325 MG tablet Take 1-2 tablets by mouth every 4 (four) hours as needed.  . potassium chloride SA (K-DUR,KLOR-CON) 20 MEQ tablet Take 1 tablet (20 mEq total) by mouth 3 (three) times daily.  . sildenafil (VIAGRA) 100 MG tablet Take 100 mg by mouth daily as needed for erectile dysfunction.  Marland Kitchen tiotropium (SPIRIVA) 18 MCG inhalation capsule Place 18 mcg into inhaler and inhale daily.    . traZODone (DESYREL) 100 MG tablet Take 100 mg by mouth at bedtime.    No facility-administered encounter medications on file as of 10/24/2016.     Allergies: Gabapentin and Zocor [simvastatin]  Social History   Social History  . Marital status: Divorced    Spouse name: N/A  . Number of children: N/A  .  Years of education: N/A   Occupational History  . retired    Social History Main Topics  . Smoking status: Never Smoker  . Smokeless tobacco: Never Used  . Alcohol use No  . Drug use: No  . Sexual activity: Not on file   Other Topics Concern  . Not on file   Social History Narrative  . No narrative on file    Family History  Problem Relation Age of Onset  . Hyperlipidemia Mother   . Hypertension Mother   . Heart disease Mother   . AAA (abdominal aortic aneurysm) Mother   . Hypertension Father   . AAA (abdominal aortic aneurysm) Father   . Heart disease Father   . Hyperlipidemia Father   . Throat cancer Maternal Uncle     Review of Systems: A 12-system review of systems was performed and was negative except as noted in the HPI.  --------------------------------------------------------------------------------------------------  Physical Exam: BP 120/82 (BP Location: Left Arm, Patient Position: Sitting, Cuff Size: Normal)   Pulse 70   Ht 6' (1.829 m)   Wt 182 lb 12.8 oz (82.9 kg)  SpO2 96%   BMI 24.79 kg/m   General:  Well-developed, well-nourished man, seated comfortably in the exam room. HEENT: No conjunctival pallor or scleral icterus.  Moist mucous membranes.  OP clear. Neck: Supple without lymphadenopathy, thyromegaly, JVD, or HJR. Lungs: Normal work of breathing.  Clear to auscultation bilaterally without wheezes or crackles. Heart: Heart sounds. Regular rate and rhythm without murmurs, rubs, or gallops.  Non-displaced PMI. Well-healed median sternotomy scar. Abd: Bowel sounds present.  Soft, NT/ND without hepatosplenomegaly Ext: No lower extremity edema.  Radial, PT, and DP pulses are 2+ bilaterally. Skin: warm and dry without rash  Lab Results  Component Value Date   WBC 8.7 04/16/2016   HGB 14.0 04/16/2016   HCT 41.5 04/16/2016   MCV 96.5 04/16/2016   PLT 171 04/16/2016    Lab Results  Component Value Date   NA 141 10/24/2016   K 3.8  10/24/2016   CL 102 10/24/2016   CO2 23 10/24/2016   BUN 16 10/24/2016   CREATININE 1.09 10/24/2016   GLUCOSE 97 10/24/2016   ALT 18 04/16/2016    Lab Results  Component Value Date   CHOL 120 (L) 03/04/2016   HDL 32 (L) 03/04/2016   LDLCALC 59 03/04/2016   TRIG 144 03/04/2016   CHOLHDL 3.8 03/04/2016    --------------------------------------------------------------------------------------------------  ASSESSMENT AND PLAN: Orthostatic lightheadedness Much improved with discontinuation of chlorthalidone. He continues to have intermittent dizziness, though this has been a chronic problem for him and likely not related to blood pressure medications. We will not make any further medication changes today.  Coronary artery disease No new chest pain. Exertional dyspnea is unchanged. We will continue with her current medication regimen for secondary prevention.  Hypertension Mildly elevated diastolic blood pressure. However, the setting of orthostatic hypotension, we will not make any changes today.  Hyperlipidemia Lipids at goal last September. Continue statin therapy.  Carotid artery stenosis Moderate carotid artery disease noted by recent duplex ultrasound. We will plan to repeat study in 08/2017.  AAA Patient is status post EVAR by Dr. Trula Slade. Continue follow up with vascular surgery, given history of type II endoleak.  Follow-up: Return to clinic in 02/2017.  Nelva Bush, MD 10/26/2016 2:44 PM

## 2016-10-24 NOTE — Patient Instructions (Signed)
Medication Instructions:  Your physician recommends that you continue on your current medications as directed. Please refer to the Current Medication list given to you today.   Labwork: BMET today  Testing/Procedures: none  Follow-Up: Your physician recommends that you schedule a follow-up appointment in: September with Dr End.       If you need a refill on your cardiac medications before your next appointment, please call your pharmacy.

## 2016-10-25 LAB — BASIC METABOLIC PANEL
BUN / CREAT RATIO: 15 (ref 10–24)
BUN: 16 mg/dL (ref 8–27)
CHLORIDE: 102 mmol/L (ref 96–106)
CO2: 23 mmol/L (ref 18–29)
Calcium: 9 mg/dL (ref 8.6–10.2)
Creatinine, Ser: 1.09 mg/dL (ref 0.76–1.27)
GFR calc Af Amer: 79 mL/min/{1.73_m2} (ref >59)
GFR calc non Af Amer: 68 mL/min/{1.73_m2} (ref >59)
GLUCOSE: 97 mg/dL (ref 65–99)
Potassium: 3.8 mmol/L (ref 3.5–5.2)
SODIUM: 141 mmol/L (ref 134–144)

## 2016-10-26 ENCOUNTER — Encounter: Payer: Self-pay | Admitting: Internal Medicine

## 2016-12-17 ENCOUNTER — Other Ambulatory Visit: Payer: Self-pay | Admitting: Surgery

## 2017-01-20 NOTE — Pre-Procedure Instructions (Signed)
RASHAWD LASKARIS  01/20/2017      [facility] ([facility]), [place] [address] [place] ([place]) [place] [address] Phone: [phone] Fax: [phone]    Your procedure is scheduled on August 15  Report to [place] at [place].M.  Call this number if you have problems the morning of surgery:  [phone]   Remember:  Do not eat food or drink liquids after midnight.  Continue all other medications as directed by your physician except follow these instructions about you medications    Take these medicines the morning of surgery with A SIP OF WATER amLODipine (NORVASC), oxyCODONE-acetaminophen (PERCOCET/ROXICET) if needed, tiotropium (SPIRIVA)  7 days prior to surgery STOP taking any  Aleve, Naproxen, Ibuprofen, Motrin, Advil, Goody's, BC's, all herbal medications, fish oil, and all vitamins  Follow physicians instructions about clopidogrel (PLAVIX)  Follow your doctors instructions regarding your Aspirin.  If no instructions were given by the doctor you will need to call the office to get instructions.  Your pre admission RN will also call for those instructions  WHAT DO I DO ABOUT MY DIABETES MEDICATION?   Marland Kitchen Do not take oral diabetes medicines (pills) the morning of surgery. glipiZIDE (GLUCOTROL) and metFORMIN (Comern­o)   How to Manage Your Diabetes Before and After Surgery  Why is it important to control my blood sugar before and after surgery? . Improving blood sugar levels before and after surgery helps healing and can limit problems. . A way of improving blood sugar control is eating a healthy diet by: o  Eating less sugar and carbohydrates o  Increasing activity/exercise o  Talking with your doctor about reaching your blood sugar goals . High blood sugars (greater than 180 mg/dL) can raise your risk of infections and slow your recovery, so you will need to focus on controlling your diabetes during the weeks  before surgery. . Make sure that the doctor who takes care of your diabetes knows about your planned surgery including the date and location.  How do I manage my blood sugar before surgery? . Check your blood sugar at least 4 times a day, starting 2 days before surgery, to make sure that the level is not too high or low. o Check your blood sugar the morning of your surgery when you wake up and every 2 hours until you get to the Short Stay unit. . If your blood sugar is less than 70 mg/dL, you will need to treat for low blood sugar: o Do not take insulin. o Treat a low blood sugar (less than 70 mg/dL) with  cup of clear juice (cranberry or apple), 4 glucose tablets, OR glucose gel. o Recheck blood sugar in 15 minutes after treatment (to make sure it is greater than 70 mg/dL). If your blood sugar is not greater than 70 mg/dL on recheck, call [phone] for further instructions. . Report your blood sugar to the short stay nurse when you get to Short Stay.  . If you are admitted to the hospital after surgery: o Your blood sugar will be checked by the staff and you will probably be given insulin after surgery (instead of oral diabetes medicines) to make sure you have good blood sugar levels. o The goal for blood sugar control after surgery is 80-180 mg/dL.     Do not wear jewelry  Do not wear lotions, powders, or cologne, or deoderant.  Men may shave face and neck.  Do not bring valuables to the hospital.  [facility] is not responsible for any belongings or valuables.  Contacts, dentures or bridgework may not be worn into surgery.  Leave your suitcase in the car.  After surgery it may be brought to your room.  For patients admitted to the hospital, discharge time will be determined by your treatment team.  Patients discharged the day of surgery will not be allowed to drive home.    Special instructions:   [facility]- Preparing For Surgery  Before surgery, you can play an important  role. Because skin is not sterile, your skin needs to be as free of germs as possible. You can reduce the number of germs on your skin by washing with CHG (chlorahexidine gluconate) Soap before surgery.  CHG is an antiseptic cleaner which kills germs and bonds with the skin to continue killing germs even after washing.  Please do not use if you have an allergy to CHG or antibacterial soaps. If your skin becomes reddened/irritated stop using the CHG.  Do not shave (including legs and underarms) for at least 48 hours prior to first CHG shower. It is OK to shave your face.  Please follow these instructions carefully.   1. Shower the NIGHT BEFORE SURGERY and the MORNING OF SURGERY with CHG.   2. If you chose to wash your hair, wash your hair first as usual with your normal shampoo.  3. After you shampoo, rinse your hair and body thoroughly to remove the shampoo.  4. Use CHG as you would any other liquid soap. You can apply CHG directly to the skin and wash gently with a scrungie or a clean washcloth.   5. Apply the CHG Soap to your body ONLY FROM THE NECK DOWN.  Do not use on open wounds or open sores. Avoid contact with your eyes, ears, mouth and genitals (private parts). Wash genitals (private parts) with your normal soap.  6. Wash thoroughly, paying special attention to the area where your surgery will be performed.  7. Thoroughly rinse your body with warm water from the neck down.  8. DO NOT shower/wash with your normal soap after using and rinsing off the CHG Soap.  9. Pat yourself dry with a CLEAN TOWEL.   10. Wear CLEAN PAJAMAS   11. Place CLEAN SHEETS on your bed the night of your first shower and DO NOT SLEEP WITH PETS.    Day of Surgery: Do not apply any deodorants/lotions. Please wear clean clothes to the hospital/surgery center.      Please read over the following fact sheets that you were given.

## 2017-01-21 ENCOUNTER — Encounter (HOSPITAL_COMMUNITY): Payer: Self-pay

## 2017-01-21 ENCOUNTER — Encounter (HOSPITAL_COMMUNITY)
Admission: RE | Admit: 2017-01-21 | Discharge: 2017-01-21 | Disposition: A | Discharge status: Home / Self Care | Payer: Medicare Other | Source: Ambulatory Visit | Attending: Surgery | Admitting: Surgery

## 2017-01-21 DIAGNOSIS — E119 Type 2 diabetes mellitus without complications: Secondary | ICD-10-CM | POA: Diagnosis not present

## 2017-01-21 DIAGNOSIS — R1031 Right lower quadrant pain: Secondary | ICD-10-CM | POA: Insufficient documentation

## 2017-01-21 DIAGNOSIS — G8929 Other chronic pain: Secondary | ICD-10-CM | POA: Insufficient documentation

## 2017-01-21 DIAGNOSIS — Z01812 Encounter for preprocedural laboratory examination: Secondary | ICD-10-CM | POA: Diagnosis present

## 2017-01-21 LAB — CBC
HEMATOCRIT: 37.7 % — AB (ref 39.0–52.0)
HEMOGLOBIN: 12.8 g/dL — AB (ref 13.0–17.0)
MCH: 31.8 pg (ref 26.0–34.0)
MCHC: 34 g/dL (ref 30.0–36.0)
MCV: 93.8 fL (ref 78.0–100.0)
Platelets: 184 10*3/uL (ref 150–400)
RBC: 4.02 MIL/uL — AB (ref 4.22–5.81)
RDW: 12.9 % (ref 11.5–15.5)
WBC: 9 10*3/uL (ref 4.0–10.5)

## 2017-01-21 LAB — GLUCOSE, CAPILLARY: Glucose-Capillary: 177 mg/dL — ABNORMAL HIGH (ref 65–99)

## 2017-01-21 LAB — BASIC METABOLIC PANEL
ANION GAP: 6 (ref 5–15)
BUN: 16 mg/dL (ref 6–20)
CHLORIDE: 103 mmol/L (ref 101–111)
CO2: 26 mmol/L (ref 22–32)
Calcium: 8.9 mg/dL (ref 8.9–10.3)
Creatinine, Ser: 1.23 mg/dL (ref 0.61–1.24)
GFR calc non Af Amer: 57 mL/min — ABNORMAL LOW (ref >60)
Glucose, Bld: 170 mg/dL — ABNORMAL HIGH (ref 65–99)
Potassium: 4.4 mmol/L (ref 3.5–5.1)
Sodium: 135 mmol/L (ref 135–145)

## 2017-01-21 NOTE — Progress Notes (Signed)
PCP - Leonard Downing Cardiologist - End   Chest x-ray - not needed EKG - 09/20/16 Stress Test -12/15  ECHO - 8/15 Cardiac Cath - about 10 years ago    Fasting Blood Sugar - 100s Checks Blood Sugar __1___ times a day  Sending to anesthesia for review of cardiac history and the possible need for cardiac clearnance Called office about plavix and aspirin instructions office stated that they will call patient with those instructions.  I instructed patient to call office in a couple of days if he has not heard anything.  Office said that they did not see a cardiac clearance note office   Patient denies shortness of breath, fever, cough and chest pain at PAT appointment   Patient verbalized understanding of instructions that were given to them at the PAT appointment. Patient was also instructed that they will need to review over the PAT instructions again at home before surgery.

## 2017-01-22 LAB — HEMOGLOBIN A1C
HEMOGLOBIN A1C: 6.2 % — AB (ref 4.8–5.6)
MEAN PLASMA GLUCOSE: 131 mg/dL

## 2017-01-23 NOTE — Progress Notes (Signed)
Anesthesia Chart Review: Patient is a 71 year old male scheduled for right groin exploration on 01/29/17 by Dr. Coralie Keens. Dx: Chronic right groin pain.  History includes non-smoker, CAD/NSTEMI s/p CABG (LIMA-LAD, SVG-PDA-PLA, SVG-RAMUS, SVG-DAIG) 07/31/06, ischemic cardiomyopathy '08, AI (mild per '15 echo), HTN, AAA s/p EVAR 12/11/12 with type II endoleak, DM2, carotid artery stenosis (moderate '17), melanoma excision (back) '12, COPD, anxiety, depression, left rotator cuff repair '16, bilateral inguinal hernia repairs 04/18/16.   - PCP is Dr. Arelia Sneddon. He receives medications thru the [facility]. - Cardiologist is End, last visit 10/24/16. (Previously saw Dr. Aundra Dubin until he transitioned to the [facility].) Orthostatic lightheadedness improved with discontinuation of chlorthalidone. Continued medical therapy recommended for CAD.  - Vascular surgeon is Dr. Trula Slade, last visit 02/12/16. Repeat CT scan in one year recommended.  Meds include ASA 81 mg, amlodipine, Lipitor, benazepril, Coreg, Plavix, glipizide, metformin, Pamelor, fish oil, Percocet, KCl, Viagra, Spiriva, trazodone. [facility] staff called about preoperative ASA/Plavix instructions and notified that they would contact patient with instructions.   BP (!) 113/51   Pulse (!) 55   Temp 36.4 C   Resp 20   Ht 6' (1.829 m)   Wt 178 lb (80.7 kg)   SpO2 100%   BMI 24.14 kg/m   EKG 09/20/16: NSR with first degree AV block.   Nuclear stress test 06/02/14: Overall Impression: Poor exercise capacity, but otherwise low risk stress nuclear study with borderline reduction in overall systolic function. Consider nonischemic cardiomyopathy. LV Ejection Fraction: 52%. LV Wall Motion: Mild global hypokinesis and borderline LVEF  Echo 01/31/14: Study Conclusions - Left ventricle: The cavity size was normal. Systolic function was normal. The estimated ejection fraction was in the range of 55% to 60%. Wall motion was normal; there were no regional  wall motion abnormalities. - Aortic valve: There was mild regurgitation. - Mitral valve: There was mild regurgitation. - Left atrium: The atrium was mildly dilated. - Right ventricle: The cavity size was mildly dilated. Wall thickness was normal. - Atrial septum: There was increased thickness of the septum, consistent with lipomatous hypertrophy. - Tricuspid valve: There was mild regurgitation.  By note, Holter in 08/2008 showed: Frequent PACs and blocked PACs. PVCs with trigeminy. No long pauses. Slowest HR in 40s while asleep.   Last cardiac cath was 07/28/06 pre-CABG and showed significant three-vessel CAD, EF 25% with severe global hypokinesis, moderate pulmonary hypertension.   Carotid dopplers 08/29/16: Impressions: Heterogeneous plaque, bilaterally. Stable high end 1-39% bilateral ICA stenosis. Greater than 50% stenosis in the ECA, bilaterally. Normal subclavian arteries, bilaterally. Patent vertebral arteries with antegrade flow.  CTA Abd/pelvis 02/12/16: IMPRESSION: Vascular 1. Successful endovascular aortic repair of infrarenal abdominal aortic aneurysm. The excluded aneurysm sac demonstrates no interval change at 5.1 cm compared to July 18, 2014. The previously noted type 2 endoleak is improving an is significantly less conspicuous on today's examination. Non Vascular 1. Ancillary findings without significant interval change as above including bilateral left greater than right fat containing inguinal Hernias.  Spirometry 09/06/14: FVC 2.81 58%, FEV1 2.03 55%, FEF 25-75% 1.56 48%. Moderately severe restriction.  Preoperative labs noted. Cr 1.23. Glucose 170. A1c 6.2. H/H 12.8/37.7. PLt 184.   Patient with recent cardiology follow-up. No new testing ordered. Tolerated hernia repair in November. If no acute changes then I would anticipate that he can proceed as planned.  Mitchell [name] [facility]/Anesthesiology Phone [phone] 01/23/2017 5:39 PM

## 2017-01-28 NOTE — H&P (Signed)
  Mitchell [name]  Location: [facility] Patient #: [id number] DOB: 1946/04/10 Divorced / Language: Mitchell [name] / Race: White Male   History of Present Illness   The patient is a 71 year old male who presents with chronic right groin pain. He continues to have right inguinal pain. I sent him to urologist and found no other issues regarding his testicle or his elevated PSA. He is still taking narcotics but not daily. He had no relief with an ilioinguinal nerve block or Neurontin. He again has noticed no bulge. The pain is described as sharp and severe shooting down to the testicle and to the right thigh,  Again, he had bilateral lap inguinal hernia repair with mesh and also AAA endovascular stent graft. He was experiencing the sharp groin pain prior to the hernia repair.   Allergies  Zocor *ANTIHYPERLIPIDEMICS*  Gabapentin *ANTICONVULSANTS*  Atorvastatin Calcium *ANTIHYPERLIPIDEMICS*  Allergies Reconciled   Medication History  Neurontin (100MG  Capsule, 1 (one) Capsule Oral three times daily, Taken starting 07/16/2016) Active. Percocet (5-325MG  Tablet, 1 (one) Oral every four hours, as needed, Taken starting 08/20/2016) Active. Potassium Chloride (20MEQ Tablet ER, Oral) Active. Viagra (100MG  Tablet, Oral) Active. AmLODIPine Besylate (10MG  Tablet, Oral) Active. Aspirin (81MG  Tablet, Oral) Active. Atorvastatin Calcium (40MG  Tablet, Oral) Active. Benazepril HCl (40MG  Tablet, Oral) Active. Vitamin D (Cholecalciferol) (1000UNIT Capsule, Oral) Active. Coenzyme Q10 (200MG  Capsule, Oral) Active. Colace (100MG  Capsule, Oral) Active. GlipiZIDE (10MG  Tablet, Oral) Active. MetFORMIN HCl (500MG  Tablet, Oral) Active. Multivitamin Adult (Oral) Active. Nortriptyline HCl (50MG  Capsule, Oral) Active. Omega 3 (1000MG  Capsule, Oral) Active. Spiriva HandiHaler (18MCG Capsule, Inhalation) Active. TraZODone HCl (100MG  Tablet, Oral) Active. Clopidogrel Bisulfate  (75MG  Tablet, Oral) Active. Oxycodone-Acetaminophen (5-325MG  Tablet, Oral) Active. Medications Reconciled  Vitals   Weight: 181.4 lb Height: 72in Body Surface Area: 2.04 m Body Mass Index: 24.6 kg/m  Temp.: 98.15F  Pulse: 87 (Regular)  P.OX: 97% (Room air) BP: 128/90 (Sitting, Left Arm, Standard)   Physical Exam   The physical exam findings are as follows: Note:On exam today, his wound appearance. His right groin is not tender today and there are no palpable masses or hernias Generally well in appearance Lungs clear  CV RRR Skin without rash or jaundice    Assessment & Plan   RIGHT GROIN PAIN (R10.31)  Impression: I had a long discussion with him regarding his discomfort. This sounds like nerve entrapment from either the mesh or scar tissue although he was having similar pain prior to the hernia repair. We discussed getting other xray studies but he declined.  I offered him referral to a chronic pain clinic versus continued expectant management versus groin exploration. I believe it is very reasonable to try going exploration to see if we can identify the ilioinguinal nerve and do something like nerve ablation to control his pain. I discussed the risks of surgery which includes but is not limited to bleeding, infection, recurrent pain, injury to surrounding structures, etc. He understands and wants to proceed with surgery which will be scheduled

## 2017-01-29 ENCOUNTER — Encounter (HOSPITAL_COMMUNITY)
Admission: RE | Disposition: A | Discharge status: Home / Self Care | Payer: Self-pay | Source: Ambulatory Visit | Attending: Surgery

## 2017-01-29 ENCOUNTER — Observation Stay (HOSPITAL_COMMUNITY)
Admission: RE | Admit: 2017-01-29 | Discharge: 2017-01-30 | Disposition: A | Discharge status: Home / Self Care | Payer: Medicare Other | Source: Ambulatory Visit | Attending: Surgery | Admitting: Surgery

## 2017-01-29 ENCOUNTER — Ambulatory Visit (HOSPITAL_COMMUNITY): Payer: Medicare Other | Admitting: Certified Registered Nurse Anesthetist

## 2017-01-29 ENCOUNTER — Ambulatory Visit (HOSPITAL_COMMUNITY): Payer: Medicare Other | Admitting: Emergency Medicine

## 2017-01-29 ENCOUNTER — Encounter (HOSPITAL_COMMUNITY): Payer: Self-pay | Admitting: *Deleted

## 2017-01-29 DIAGNOSIS — Z951 Presence of aortocoronary bypass graft: Secondary | ICD-10-CM | POA: Insufficient documentation

## 2017-01-29 DIAGNOSIS — R1031 Right lower quadrant pain: Secondary | ICD-10-CM | POA: Diagnosis present

## 2017-01-29 DIAGNOSIS — I110 Hypertensive heart disease with heart failure: Secondary | ICD-10-CM | POA: Insufficient documentation

## 2017-01-29 DIAGNOSIS — I2510 Atherosclerotic heart disease of native coronary artery without angina pectoris: Secondary | ICD-10-CM | POA: Insufficient documentation

## 2017-01-29 DIAGNOSIS — I252 Old myocardial infarction: Secondary | ICD-10-CM | POA: Diagnosis not present

## 2017-01-29 DIAGNOSIS — M1990 Unspecified osteoarthritis, unspecified site: Secondary | ICD-10-CM | POA: Diagnosis not present

## 2017-01-29 DIAGNOSIS — Z79891 Long term (current) use of opiate analgesic: Secondary | ICD-10-CM | POA: Insufficient documentation

## 2017-01-29 DIAGNOSIS — Z7982 Long term (current) use of aspirin: Secondary | ICD-10-CM | POA: Insufficient documentation

## 2017-01-29 DIAGNOSIS — J449 Chronic obstructive pulmonary disease, unspecified: Secondary | ICD-10-CM | POA: Diagnosis not present

## 2017-01-29 DIAGNOSIS — Z9889 Other specified postprocedural states: Secondary | ICD-10-CM | POA: Diagnosis not present

## 2017-01-29 DIAGNOSIS — Z7984 Long term (current) use of oral hypoglycemic drugs: Secondary | ICD-10-CM | POA: Diagnosis not present

## 2017-01-29 DIAGNOSIS — E1152 Type 2 diabetes mellitus with diabetic peripheral angiopathy with gangrene: Secondary | ICD-10-CM | POA: Insufficient documentation

## 2017-01-29 DIAGNOSIS — Z95828 Presence of other vascular implants and grafts: Secondary | ICD-10-CM | POA: Insufficient documentation

## 2017-01-29 DIAGNOSIS — Z79899 Other long term (current) drug therapy: Secondary | ICD-10-CM | POA: Diagnosis not present

## 2017-01-29 DIAGNOSIS — G8929 Other chronic pain: Principal | ICD-10-CM | POA: Insufficient documentation

## 2017-01-29 DIAGNOSIS — Z888 Allergy status to other drugs, medicaments and biological substances status: Secondary | ICD-10-CM | POA: Insufficient documentation

## 2017-01-29 HISTORY — PX: GROIN DISSECTION: SHX5250

## 2017-01-29 HISTORY — PX: ABLATION: SHX5711

## 2017-01-29 LAB — GLUCOSE, CAPILLARY
GLUCOSE-CAPILLARY: 161 mg/dL — AB (ref 65–99)
GLUCOSE-CAPILLARY: 190 mg/dL — AB (ref 65–99)
GLUCOSE-CAPILLARY: 188 mg/dL — AB (ref 65–99)
GLUCOSE-CAPILLARY: 171 mg/dL — AB (ref 65–99)
Glucose-Capillary: 54 mg/dL — ABNORMAL LOW (ref 65–99)
Glucose-Capillary: 85 mg/dL (ref 65–99)

## 2017-01-29 SURGERY — EXPLORATION, INGUINAL REGION
Anesthesia: General | Site: Groin | Laterality: Right

## 2017-01-29 MED ORDER — CARVEDILOL 25 MG PO TABS
25.0000 mg | ORAL_TABLET | Freq: Two times a day (BID) | ORAL | Status: DC
  Administered 2017-01-29 – 2017-01-30 (×2): 25 mg via ORAL
  Filled 2017-01-29 (×2): qty 1

## 2017-01-29 MED ORDER — ACETAMINOPHEN 650 MG RE SUPP: 650.0000 mg | RECTAL | Status: DC | PRN

## 2017-01-29 MED ORDER — TRAZODONE HCL 100 MG PO TABS
100.0000 mg | ORAL_TABLET | Freq: Every day | ORAL | Status: DC
  Administered 2017-01-29: 100 mg via ORAL
  Filled 2017-01-29: qty 1

## 2017-01-29 MED ORDER — ONDANSETRON 4 MG PO TBDP: 4.0000 mg | ORAL_TABLET | Freq: Four times a day (QID) | ORAL | Status: DC | PRN

## 2017-01-29 MED ORDER — BUPIVACAINE-EPINEPHRINE (PF) 0.5% -1:200000 IJ SOLN
INTRAMUSCULAR | Status: DC | PRN
  Administered 2017-01-29: 30 mL

## 2017-01-29 MED ORDER — FENTANYL CITRATE (PF) 100 MCG/2ML IJ SOLN
INTRAMUSCULAR | Status: AC
  Administered 2017-01-29: 50 ug
  Filled 2017-01-29: qty 2

## 2017-01-29 MED ORDER — PROPOFOL 10 MG/ML IV BOLUS
INTRAVENOUS | Status: DC | PRN
  Administered 2017-01-29: 130 mg via INTRAVENOUS

## 2017-01-29 MED ORDER — ASPIRIN 81 MG PO CHEW
81.0000 mg | CHEWABLE_TABLET | Freq: Every day | ORAL | Status: DC
  Administered 2017-01-29: 81 mg via ORAL
  Filled 2017-01-29: qty 1

## 2017-01-29 MED ORDER — HYDROMORPHONE HCL 1 MG/ML IJ SOLN
0.2500 mg | INTRAMUSCULAR | Status: DC | PRN
  Administered 2017-01-29: 0.5 mg via INTRAVENOUS

## 2017-01-29 MED ORDER — CARVEDILOL 12.5 MG PO TABS
ORAL_TABLET | ORAL | Status: AC
  Administered 2017-01-29: 25 mg
  Filled 2017-01-29: qty 2

## 2017-01-29 MED ORDER — HYDROMORPHONE HCL 1 MG/ML IJ SOLN: 1.0000 mg | INTRAMUSCULAR | Status: DC | PRN

## 2017-01-29 MED ORDER — MIDAZOLAM HCL 2 MG/2ML IJ SOLN
INTRAMUSCULAR | Status: AC
  Administered 2017-01-29: 1 mg
  Filled 2017-01-29: qty 2

## 2017-01-29 MED ORDER — EPHEDRINE 5 MG/ML INJ
INTRAVENOUS | Status: AC
  Filled 2017-01-29: qty 10

## 2017-01-29 MED ORDER — FENTANYL CITRATE (PF) 100 MCG/2ML IJ SOLN
INTRAMUSCULAR | Status: DC | PRN
  Administered 2017-01-29 (×2): 25 ug via INTRAVENOUS

## 2017-01-29 MED ORDER — CHLORHEXIDINE GLUCONATE CLOTH 2 % EX PADS: 6.0000 | MEDICATED_PAD | Freq: Once | CUTANEOUS | Status: DC

## 2017-01-29 MED ORDER — PROPOFOL 10 MG/ML IV BOLUS
INTRAVENOUS | Status: AC
  Filled 2017-01-29: qty 20

## 2017-01-29 MED ORDER — LACTATED RINGERS IV SOLN
INTRAVENOUS | Status: DC
  Administered 2017-01-29: 10:00:00 via INTRAVENOUS

## 2017-01-29 MED ORDER — BUPIVACAINE-EPINEPHRINE (PF) 0.25% -1:200000 IJ SOLN
INTRAMUSCULAR | Status: AC
  Filled 2017-01-29: qty 30

## 2017-01-29 MED ORDER — FENTANYL CITRATE (PF) 250 MCG/5ML IJ SOLN
INTRAMUSCULAR | Status: AC
  Filled 2017-01-29: qty 5

## 2017-01-29 MED ORDER — ENOXAPARIN SODIUM 40 MG/0.4ML ~~LOC~~ SOLN
40.0000 mg | SUBCUTANEOUS | Status: DC
  Administered 2017-01-30: 40 mg via SUBCUTANEOUS
  Filled 2017-01-29: qty 0.4

## 2017-01-29 MED ORDER — MIDAZOLAM HCL 2 MG/2ML IJ SOLN
INTRAMUSCULAR | Status: AC
  Filled 2017-01-29: qty 2

## 2017-01-29 MED ORDER — CLOPIDOGREL BISULFATE 75 MG PO TABS
75.0000 mg | ORAL_TABLET | Freq: Every day | ORAL | Status: DC
Start: 2017-01-29 — End: 2017-01-30
  Administered 2017-01-29 – 2017-01-30 (×2): 75 mg via ORAL
  Filled 2017-01-29 (×2): qty 1

## 2017-01-29 MED ORDER — COENZYME Q10 200 MG PO CAPS: 200.0000 mg | ORAL_CAPSULE | Freq: Every day | ORAL | Status: DC

## 2017-01-29 MED ORDER — PHENYLEPHRINE 40 MCG/ML (10ML) SYRINGE FOR IV PUSH (FOR BLOOD PRESSURE SUPPORT)
PREFILLED_SYRINGE | INTRAVENOUS | Status: DC | PRN
  Administered 2017-01-29 (×3): 80 ug via INTRAVENOUS

## 2017-01-29 MED ORDER — BENAZEPRIL HCL 40 MG PO TABS
40.0000 mg | ORAL_TABLET | Freq: Every day | ORAL | Status: DC
Start: 2017-01-29 — End: 2017-01-30
  Administered 2017-01-29 – 2017-01-30 (×2): 40 mg via ORAL
  Filled 2017-01-29 (×2): qty 1

## 2017-01-29 MED ORDER — SODIUM CHLORIDE 0.9% FLUSH
3.0000 mL | Freq: Two times a day (BID) | INTRAVENOUS | Status: DC
  Administered 2017-01-29: 3 mL via INTRAVENOUS

## 2017-01-29 MED ORDER — ONDANSETRON HCL 4 MG/2ML IJ SOLN
INTRAMUSCULAR | Status: DC | PRN
  Administered 2017-01-29: 4 mg via INTRAVENOUS

## 2017-01-29 MED ORDER — CEFAZOLIN SODIUM-DEXTROSE 2-4 GM/100ML-% IV SOLN
2.0000 g | INTRAVENOUS | Status: AC
  Administered 2017-01-29: 2 g via INTRAVENOUS
  Filled 2017-01-29: qty 100

## 2017-01-29 MED ORDER — EPHEDRINE SULFATE-NACL 50-0.9 MG/10ML-% IV SOSY
PREFILLED_SYRINGE | INTRAVENOUS | Status: DC | PRN
Start: 2017-01-29 — End: 2017-01-29
  Administered 2017-01-29 (×2): 5 mg via INTRAVENOUS

## 2017-01-29 MED ORDER — ONDANSETRON HCL 4 MG/2ML IJ SOLN
INTRAMUSCULAR | Status: AC
  Filled 2017-01-29: qty 2

## 2017-01-29 MED ORDER — SODIUM CHLORIDE 0.9 % IV SOLN: 250.0000 mL | INTRAVENOUS | Status: DC | PRN

## 2017-01-29 MED ORDER — SODIUM CHLORIDE 0.9% FLUSH: 3.0000 mL | INTRAVENOUS | Status: DC | PRN

## 2017-01-29 MED ORDER — OXYCODONE HCL 5 MG PO TABS
5.0000 mg | ORAL_TABLET | ORAL | Status: DC | PRN
  Administered 2017-01-30: 10 mg via ORAL
  Filled 2017-01-29: qty 2

## 2017-01-29 MED ORDER — LIDOCAINE 2% (20 MG/ML) 5 ML SYRINGE
INTRAMUSCULAR | Status: DC | PRN
  Administered 2017-01-29: 40 mg via INTRAVENOUS

## 2017-01-29 MED ORDER — NORTRIPTYLINE HCL 25 MG PO CAPS
50.0000 mg | ORAL_CAPSULE | Freq: Every day | ORAL | Status: DC
  Administered 2017-01-29: 50 mg via ORAL
  Filled 2017-01-29: qty 2

## 2017-01-29 MED ORDER — 0.9 % SODIUM CHLORIDE (POUR BTL) OPTIME
TOPICAL | Status: DC | PRN
  Administered 2017-01-29: 1000 mL

## 2017-01-29 MED ORDER — INSULIN ASPART 100 UNIT/ML ~~LOC~~ SOLN
0.0000 [IU] | Freq: Three times a day (TID) | SUBCUTANEOUS | Status: DC
  Administered 2017-01-29: 3 [IU] via SUBCUTANEOUS

## 2017-01-29 MED ORDER — OXYCODONE HCL 5 MG PO TABS: 5.0000 mg | ORAL_TABLET | ORAL | Status: DC | PRN

## 2017-01-29 MED ORDER — DEXTROSE 5 % IV SOLN
INTRAVENOUS | Status: DC | PRN
  Administered 2017-01-29: 25 ug/min via INTRAVENOUS

## 2017-01-29 MED ORDER — METFORMIN HCL ER 500 MG PO TB24
500.0000 mg | ORAL_TABLET | Freq: Every day | ORAL | Status: DC
  Administered 2017-01-29: 500 mg via ORAL
  Filled 2017-01-29: qty 1

## 2017-01-29 MED ORDER — BUPIVACAINE-EPINEPHRINE 0.25% -1:200000 IJ SOLN
INTRAMUSCULAR | Status: DC | PRN
  Administered 2017-01-29: 20 mL

## 2017-01-29 MED ORDER — PHENYLEPHRINE 40 MCG/ML (10ML) SYRINGE FOR IV PUSH (FOR BLOOD PRESSURE SUPPORT)
PREFILLED_SYRINGE | INTRAVENOUS | Status: AC
  Filled 2017-01-29: qty 10

## 2017-01-29 MED ORDER — MORPHINE SULFATE (PF) 4 MG/ML IV SOLN: 1.0000 mg | INTRAVENOUS | Status: DC | PRN

## 2017-01-29 MED ORDER — HYDROMORPHONE HCL 1 MG/ML IJ SOLN
INTRAMUSCULAR | Status: AC
  Filled 2017-01-29: qty 1

## 2017-01-29 MED ORDER — ONDANSETRON HCL 4 MG/2ML IJ SOLN: 4.0000 mg | Freq: Four times a day (QID) | INTRAMUSCULAR | Status: DC | PRN

## 2017-01-29 MED ORDER — AMLODIPINE BESYLATE 10 MG PO TABS
10.0000 mg | ORAL_TABLET | Freq: Every day | ORAL | Status: DC
  Administered 2017-01-30: 10 mg via ORAL
  Filled 2017-01-29 (×2): qty 1

## 2017-01-29 MED ORDER — ACETAMINOPHEN 325 MG PO TABS: 650.0000 mg | ORAL_TABLET | ORAL | Status: DC | PRN

## 2017-01-29 MED ORDER — OXYCODONE-ACETAMINOPHEN 5-325 MG PO TABS: 1.0000 | ORAL_TABLET | Freq: Four times a day (QID) | ORAL | 0 refills | Status: DC | PRN

## 2017-01-29 MED ORDER — POTASSIUM CHLORIDE IN NACL 20-0.9 MEQ/L-% IV SOLN
INTRAVENOUS | Status: DC
  Administered 2017-01-29: 14:00:00 via INTRAVENOUS
  Filled 2017-01-29: qty 1000

## 2017-01-29 MED ORDER — LACTATED RINGERS IV SOLN
INTRAVENOUS | Status: DC | PRN
Start: 2017-01-29 — End: 2017-01-29
  Administered 2017-01-29: 10:00:00 via INTRAVENOUS

## 2017-01-29 MED ORDER — GLIPIZIDE 5 MG PO TABS
5.0000 mg | ORAL_TABLET | Freq: Two times a day (BID) | ORAL | Status: DC
  Administered 2017-01-29 – 2017-01-30 (×3): 5 mg via ORAL
  Filled 2017-01-29 (×3): qty 1

## 2017-01-29 SURGICAL SUPPLY — 48 items
ADH SKN CLS APL DERMABOND .7 (GAUZE/BANDAGES/DRESSINGS) ×1
BLADE CLIPPER SURG (BLADE) ×2 IMPLANT
CANISTER SUCT 3000ML PPV (MISCELLANEOUS) IMPLANT
CHLORAPREP W/TINT 26ML (MISCELLANEOUS) ×3 IMPLANT
COVER SURGICAL LIGHT HANDLE (MISCELLANEOUS) ×3 IMPLANT
DECANTER SPIKE VIAL GLASS SM (MISCELLANEOUS) ×3 IMPLANT
DERMABOND ADVANCED (GAUZE/BANDAGES/DRESSINGS) ×2
DERMABOND ADVANCED .7 DNX12 (GAUZE/BANDAGES/DRESSINGS) ×1 IMPLANT
DRAIN PENROSE 1/2X12 LTX STRL (WOUND CARE) ×2 IMPLANT
DRAPE LAPAROTOMY 100X72 PEDS (DRAPES) ×3 IMPLANT
DRAPE UTILITY XL STRL (DRAPES) ×6 IMPLANT
ELECT CAUTERY BLADE 6.4 (BLADE) ×3 IMPLANT
ELECT REM PT RETURN 9FT ADLT (ELECTROSURGICAL) ×3
ELECTRODE REM PT RTRN 9FT ADLT (ELECTROSURGICAL) ×1 IMPLANT
GLOVE BIO SURGEON STRL SZ8 (GLOVE) ×1 IMPLANT
GLOVE BIOGEL PI IND STRL 8 (GLOVE) ×1 IMPLANT
GLOVE BIOGEL PI INDICATOR 8 (GLOVE)
GLOVE SURG SIGNA 7.5 PF LTX (GLOVE) ×2 IMPLANT
GOWN STRL REUS W/ TWL LRG LVL3 (GOWN DISPOSABLE) ×1 IMPLANT
GOWN STRL REUS W/ TWL XL LVL3 (GOWN DISPOSABLE) ×1 IMPLANT
GOWN STRL REUS W/TWL LRG LVL3 (GOWN DISPOSABLE) ×3
GOWN STRL REUS W/TWL XL LVL3 (GOWN DISPOSABLE) ×3
KIT BASIN OR (CUSTOM PROCEDURE TRAY) ×3 IMPLANT
KIT ROOM TURNOVER OR (KITS) ×3 IMPLANT
NDL HYPO 25GX1X1/2 BEV (NEEDLE) ×1 IMPLANT
NEEDLE HYPO 25GX1X1/2 BEV (NEEDLE) ×3 IMPLANT
NS IRRIG 1000ML POUR BTL (IV SOLUTION) ×3 IMPLANT
PACK SURGICAL SETUP 50X90 (CUSTOM PROCEDURE TRAY) ×3 IMPLANT
PAD ARMBOARD 7.5X6 YLW CONV (MISCELLANEOUS) ×6 IMPLANT
PENCIL BUTTON HOLSTER BLD 10FT (ELECTRODE) ×3 IMPLANT
PLUG ATRIUM AND PATCH X LRG (MISCELLANEOUS) IMPLANT
SPECIMEN JAR SMALL (MISCELLANEOUS) IMPLANT
SPONGE LAP 18X18 X RAY DECT (DISPOSABLE) ×3 IMPLANT
SUT MNCRL AB 4-0 PS2 18 (SUTURE) ×3 IMPLANT
SUT PROLENE 2 0 CT2 30 (SUTURE) ×3 IMPLANT
SUT VIC AB 2-0 SH 27 (SUTURE) ×3
SUT VIC AB 2-0 SH 27X BRD (SUTURE) ×1 IMPLANT
SUT VIC AB 3-0 SH 27 (SUTURE) ×3
SUT VIC AB 3-0 SH 27X BRD (SUTURE) ×1 IMPLANT
SUT VICRYL AB 3 0 TIES (SUTURE) IMPLANT
SYR BULB 3OZ (MISCELLANEOUS) ×3 IMPLANT
SYR CONTROL 10ML LL (SYRINGE) ×3 IMPLANT
TOWEL OR 17X24 6PK STRL BLUE (TOWEL DISPOSABLE) ×3 IMPLANT
TOWEL OR 17X26 10 PK STRL BLUE (TOWEL DISPOSABLE) ×3 IMPLANT
TUBE CONNECTING 12'X1/4 (SUCTIONS) ×1
TUBE CONNECTING 12X1/4 (SUCTIONS) ×1 IMPLANT
WATER STERILE IRR 1000ML POUR (IV SOLUTION) IMPLANT
YANKAUER SUCT BULB TIP NO VENT (SUCTIONS) ×2 IMPLANT

## 2017-01-29 NOTE — Anesthesia Preprocedure Evaluation (Addendum)
Anesthesia Evaluation  Patient identified by MRN, date of birth, ID band Patient awake    Reviewed: Allergy & Precautions, H&P , NPO status , Patient's Chart, lab work & pertinent test results, reviewed documented beta blocker date and time   Airway Mallampati: III  TM Distance: >3 FB Neck ROM: Full    Dental no notable dental hx. (+) Teeth Intact, Dental Advisory Given   Pulmonary COPD,  COPD inhaler,    Pulmonary exam normal breath sounds clear to auscultation       Cardiovascular hypertension, Pt. on medications and Pt. on home beta blockers + CAD, + Past MI, + CABG, + Peripheral Vascular Disease and +CHF   Rhythm:Regular Rate:Normal     Neuro/Psych Anxiety Depression negative neurological ROS     GI/Hepatic negative GI ROS, Neg liver ROS,   Endo/Other  diabetes, Type 2, Oral Hypoglycemic Agents  Renal/GU negative Renal ROS  negative genitourinary   Musculoskeletal  (+) Arthritis , Osteoarthritis,    Abdominal   Peds  Hematology  (+) anemia ,   Anesthesia Other Findings   Reproductive/Obstetrics negative OB ROS                            Anesthesia Physical Anesthesia Plan  ASA: III  Anesthesia Plan: General   Post-op Pain Management:  Regional for Post-op pain   Induction: Intravenous  PONV Risk Score and Plan: 3 and Ondansetron, Dexamethasone and Midazolam  Airway Management Planned: LMA  Additional Equipment:   Intra-op Plan:   Post-operative Plan: Extubation in OR  Informed Consent: I have reviewed the patients History and Physical, chart, labs and discussed the procedure including the risks, benefits and alternatives for the proposed anesthesia with the patient or authorized representative who has indicated his/her understanding and acceptance.   Dental advisory given  Plan Discussed with: CRNA  Anesthesia Plan Comments:        Anesthesia Quick  Evaluation

## 2017-01-29 NOTE — Discharge Instructions (Signed)
[facility] _______[facility]  UMBILICAL OR INGUINAL HERNIA REPAIR: POST OP INSTRUCTIONS  Always review your discharge instruction sheet given to you by the facility where your surgery was performed. IF YOU HAVE DISABILITY OR FAMILY LEAVE FORMS, YOU MUST BRING THEM TO THE OFFICE FOR PROCESSING.   DO NOT GIVE THEM TO YOUR DOCTOR.  1. A  prescription for pain medication may be given to you upon discharge.  Take your pain medication as prescribed, if needed.  If narcotic pain medicine is not needed, then you may take acetaminophen (Tylenol) or ibuprofen (Advil) as needed. 2. Take your usually prescribed medications unless otherwise directed. If you need a refill on your pain medication, please contact your pharmacy.  They will contact our office to request authorization. Prescriptions will not be filled after 5 pm or on week-ends. 3. You should follow a light diet the first 24 hours after arrival home, such as soup and crackers, etc.  Be sure to include lots of fluids daily.  Resume your normal diet the day after surgery. 4.Most patients will experience some swelling and bruising around the umbilicus or in the groin and scrotum.  Ice packs and reclining will help.  Swelling and bruising can take several days to resolve.  6. It is common to experience some constipation if taking pain medication after surgery.  Increasing fluid intake and taking a stool softener (such as Colace) will usually help or prevent this problem from occurring.  A mild laxative (Milk of Magnesia or Miralax) should be taken according to package directions if there are no bowel movements after 48 hours. 7. Unless discharge instructions indicate otherwise, you may remove your bandages 24-48 hours after surgery, and you may shower at that time.  You may have steri-strips (small skin tapes) in place directly over the incision.  These strips should be left on the skin for 7-10 days.  If your surgeon used skin glue on the  incision, you may shower in 24 hours.  The glue will flake off over the next 2-3 weeks.  Any sutures or staples will be removed at the office during your follow-up visit. 8. ACTIVITIES:  You may resume regular (light) daily activities beginning the next day--such as daily self-care, walking, climbing stairs--gradually increasing activities as tolerated.  You may have sexual intercourse when it is comfortable.  Refrain from any heavy lifting or straining until approved by your doctor.  a.You may drive when you are no longer taking prescription pain medication, you can comfortably wear a seatbelt, and you can safely maneuver your car and apply brakes. b.RETURN TO WORK:   _____________________________________________  9.You should see your doctor in the office for a follow-up appointment approximately 2-3 weeks after your surgery.  Make sure that you call for this appointment within a day or two after you arrive home to insure a convenient appointment time. 10.OTHER INSTRUCTIONS: ___OK TO SHOWER TOMORROW NO LIFTING FOR 2 TO 3 WEEKS______________________    _____________________________________  WHEN TO CALL YOUR DOCTOR: 1. Fever over 101.0 2. Inability to urinate 3. Nausea and/or vomiting 4. Extreme swelling or bruising 5. Continued bleeding from incision. 6. Increased pain, redness, or drainage from the incision  The clinic staff is available to answer your questions during regular business hours.  Please dont hesitate to call and ask to speak to one of the nurses for clinical concerns.  If you have a medical emergency, go to the nearest emergency room or call 911.  A Psychologist, sport and exercise from [facility] is always on call at the hospital   [address] [place], [place], [place]  [address] ?  [address] [place], [place],    [address] [phone] ? [phone] ? FAX [phone] Web site: [email or web address]

## 2017-01-29 NOTE — Op Note (Signed)
RIGHT GROIN EXPLORATION, ABLATION OF RIGHT ILEO-INGUINAL NERVE  Procedure Note  Mitchell [name] 01/29/2017   Pre-op Diagnosis: chronic right groin pain     Post-op Diagnosis: same  Procedure(s): RIGHT GROIN EXPLORATION, ABLATION OF RIGHT ILEO-INGUINAL NERVE (Neurectomy)  Surgeon(s): Coralie Keens, MD  Anesthesia: General  Staff:  Circulator: Nicholos Johns, RN Scrub Person: Arlys John, CST Circulator Assistant: Milas Kocher, RN  Estimated Blood Loss: Minimal            Indications: This is [name] 71 year old gentleman status post bilateral laparoscopic inguinal hernia repair with mesh. Prior to this, he had an endovascular aneurysm repair. Prior to the hernia repair, he been having burning discomfort in the right groin. This has persisted. The decision has been made to proceed with groin exploration in hopes of relieving his discomfort  Findings: The patient's pre-peritoneal laparoscopic hernia repair on the right side was completely intact. I saw no evidence of recurrent hernia. I can see [name] Prolene suture from his previous arteriotomy below the inguinal ligament. I was able to easily identify the ilioinguinal nerve and performed [name] neurectomy.  Procedure: The patient was brought to the operative room and identified as the correct patient. He was placed supine on the operating table and general anesthesia was induced. His abdomen was then prepped and draped in the usual sterile fashion. I made [name] longitudinal incision in the patient's right lower quadrant. I then dissected down through Scarpa's fascia with electrocautery. I identified the external oblique fascia. Below the inguinal ligament, I can visualize [name] Prolene suture from the patient's previous arteriotomy. I did not want to remove the suture but I did shorten it slightly. I then opened the external oblique fascia. Identify the testicular cord structures. The patient's previous preperitoneal hernia repair felt to be  intact. Again, there is no evidence of recurrent hernia. I used the absorbable tacks at the time of that surgery so there were no tacks to remove. I easily identify the ilioinguinal nerve. As he was having the discomfort prior to the hernia surgery, I elected to perform [name] neurectomy of the ilioinguinal nerve to see if this would aid his discomfort. I excised the nerve from the internal ring to the distal testicular cord. I then injected Marcaine around the area. I then closed the external oblique of the top of this with [name] running 2-0 Vicryl suture. I then closed Scarpa's fascia with [name] running 3-0 Vicryl suture. I then closed the skin with [name] running 4-0 Monocryl. Dermabond was then applied. The patient tolerated procedure well. All counts were correct at the end of  the procedure. The patient was then extubated in the operating room and taken in stable condition to the recovery room.          Mitchell [name]   Date: 01/29/2017  Time: 11:14 AM

## 2017-01-29 NOTE — Transfer of Care (Signed)
Immediate Anesthesia Transfer of Care Note  Patient: Mitchell [name]  Procedure(s) Performed: Procedure(s) with comments: RIGHT GROIN EXPLORATION, ABLATION OF RIGHT ILEO-INGUINAL NERVE (Right) - TAP BLOCK  Patient Location: PACU  Anesthesia Type:GA combined with regional for post-op pain  Level of Consciousness: alert , oriented, drowsy and patient cooperative  Airway & Oxygen Therapy: Patient Spontanous Breathing and Patient connected to nasal cannula oxygen  Post-op Assessment: Report given to RN and Post -op Vital signs reviewed and stable  Post vital signs: Reviewed and stable  Last Vitals:  Vitals:   01/29/17 1004 01/29/17 1120  BP:  117/64  Pulse: 77   Resp:    Temp:  36.6 C  SpO2:      Last Pain:  Vitals:   01/29/17 1120  TempSrc:   PainSc: Asleep         Complications: No apparent anesthesia complications

## 2017-01-29 NOTE — Anesthesia Procedure Notes (Signed)
Procedure Name: LMA Insertion Date/Time: 01/29/2017 10:38 AM Performed by: Everlean Cherry A Pre-anesthesia Checklist: Patient identified, Emergency Drugs available, Suction available and Patient being monitored Patient Re-evaluated:Patient Re-evaluated prior to induction Oxygen Delivery Method: Circle system utilized Preoxygenation: Pre-oxygenation with 100% oxygen Induction Type: IV induction Ventilation: Mask ventilation without difficulty LMA: LMA inserted LMA Size: 5.0 Number of attempts: 1 Placement Confirmation: positive ETCO2 and breath sounds checked- equal and bilateral Tube secured with: Tape Dental Injury: Teeth and Oropharynx as per pre-operative assessment

## 2017-01-29 NOTE — Anesthesia Procedure Notes (Signed)
Anesthesia Regional Block: TAP block   Pre-Anesthetic Checklist: ,, timeout performed, Correct Patient, Correct Site, Correct Laterality, Correct Procedure, Correct Position, site marked, Risks and benefits discussed, pre-op evaluation,  At surgeon's request and post-op pain management  Laterality: Right  Prep: Maximum Sterile Barrier Precautions used, chloraprep       Needles:  Injection technique: Single-shot  Needle Type: Echogenic Stimulator Needle     Needle Length: 9cm  Needle Gauge: 21     Additional Needles:   Procedures: ultrasound guided,,,,,,,,  Narrative:  Start time: 01/29/2017 10:04 AM End time: 01/29/2017 10:14 AM Injection made incrementally with aspirations every 5 mL. Anesthesiologist: Roderic Palau  Additional Notes: 2% Lidocaine skin wheel.

## 2017-01-30 ENCOUNTER — Encounter (HOSPITAL_COMMUNITY): Payer: Self-pay | Admitting: Surgery

## 2017-01-30 DIAGNOSIS — G8929 Other chronic pain: Secondary | ICD-10-CM | POA: Diagnosis not present

## 2017-01-30 LAB — BASIC METABOLIC PANEL
Anion gap: 7 (ref 5–15)
BUN: 13 mg/dL (ref 6–20)
CALCIUM: 8.5 mg/dL — AB (ref 8.9–10.3)
CHLORIDE: 105 mmol/L (ref 101–111)
CO2: 27 mmol/L (ref 22–32)
CREATININE: 1.02 mg/dL (ref 0.61–1.24)
Glucose, Bld: 100 mg/dL — ABNORMAL HIGH (ref 65–99)
Potassium: 4.2 mmol/L (ref 3.5–5.1)
SODIUM: 139 mmol/L (ref 135–145)

## 2017-01-30 LAB — CBC
HCT: 34 % — ABNORMAL LOW (ref 39.0–52.0)
HEMOGLOBIN: 11.4 g/dL — AB (ref 13.0–17.0)
MCH: 31.8 pg (ref 26.0–34.0)
MCHC: 33.5 g/dL (ref 30.0–36.0)
MCV: 95 fL (ref 78.0–100.0)
PLATELETS: 172 10*3/uL (ref 150–400)
RBC: 3.58 MIL/uL — ABNORMAL LOW (ref 4.22–5.81)
RDW: 13.1 % (ref 11.5–15.5)
WBC: 8.1 10*3/uL (ref 4.0–10.5)

## 2017-01-30 LAB — GLUCOSE, CAPILLARY: GLUCOSE-CAPILLARY: 98 mg/dL (ref 65–99)

## 2017-01-30 NOTE — Progress Notes (Signed)
Pt CBG at 2203 is 54. Pt asymptomatic, gave OJ and graham crackers. Rechecked CBg at 2236 is 85.

## 2017-01-30 NOTE — Progress Notes (Signed)
Inpatient Diabetes Program Recommendations  AACE/ADA: New Consensus Statement on Inpatient Glycemic Control (2015)  Target Ranges:  Prepandial:   less than 140 mg/dL      Peak postprandial:   less than 180 mg/dL (1-2 hours)      Critically ill patients:  140 - 180 mg/dL   Lab Results  Component Value Date   GLUCAP 85 01/29/2017   HGBA1C 6.2 (H) 01/21/2017    Review of Glycemic Control  Diabetes history: DM 2  Current orders for Inpatient glycemic control: Metformin 500 mg Daily with supper, NOvolog Moderate 0-15 units tid, Glipizide 5mg  BID  Inpatient Diabetes Program Recommendations:    Patient had hypoglycemia last night, 54 mg/dl at 2203 pm. Consider d/cing Glipizide as it has a hight risk of hypoglycemia inpatient.  Thanks,  Tama Headings RN, MSN, [facility] Inpatient Diabetes Coordinator Team Pager [phone] (8a-5p)

## 2017-01-30 NOTE — Discharge Summary (Signed)
Physician Discharge Summary  Patient ID: LEEUM SANKEY MRN: [id number] DOB/AGE: 1946-01-18 71 y.o.  Admit date: 01/29/2017 Discharge date: 01/30/2017  Admission Diagnoses:  Discharge Diagnoses:  Active Problems:   Right groin pain   Discharged Condition: good  Hospital Course: uneventful post op recovery.  Discharged home POD#1  Consults: None  Significant Diagnostic Studies:   Treatments: surgery: right groin exploration, neurectomy  Discharge Exam: Blood pressure 123/60, pulse 66, temperature 98.1 F (36.7 C), temperature source Oral, resp. rate 19, height 6' (1.829 m), weight 72.8 kg (160 lb 6.4 oz), SpO2 98 %. General appearance: alert, cooperative and no distress Resp: clear to auscultation bilaterally Cardio: regular rate and rhythm, S1, S2 normal, no murmur, click, rub or gallop Incision/Wound:abdomen soft, ND, incision clean  Disposition: 01-Home or Self Care  Discharge Instructions    Discharge patient    Complete by:  As directed    Discharge disposition:  01-Home or Self Care   Discharge patient date:  01/30/2017     Allergies as of 01/30/2017      Reactions   Gabapentin Other (See Comments)   Tremors   Zocor [simvastatin] Other (See Comments)   Bone Pain   Tape Rash, Other (See Comments)   REDNESS      Medication List    TAKE these medications   amLODipine 10 MG tablet Commonly known as:  NORVASC Take 10 mg by mouth daily.   aspirin 81 MG chewable tablet Chew 81 mg by mouth at bedtime.   atorvastatin 40 MG tablet Commonly known as:  LIPITOR Take 40 mg by mouth at bedtime.   benazepril 40 MG tablet Commonly known as:  LOTENSIN Take 40 mg by mouth daily.   cholecalciferol 1000 units tablet Commonly known as:  VITAMIN D Take 1,000 Units by mouth daily.   clopidogrel 75 MG tablet Commonly known as:  PLAVIX TAKE ONE TABLET BY MOUTH ONCE DAILY   Coenzyme Q10 200 MG capsule Take 200 mg by mouth at bedtime.   COREG 25 MG  tablet Generic drug:  carvedilol Take 1 tablet (25 mg total) by mouth 2 (two) times daily.   docusate sodium 100 MG capsule Commonly known as:  COLACE Take 100 mg by mouth 2 (two) times daily as needed for mild constipation.   FISH OIL TRIPLE STRENGTH 1400 MG Caps Take 1,400 mg by mouth every morning.   glipiZIDE 10 MG tablet Commonly known as:  GLUCOTROL Take 5 mg by mouth 2 (two) times daily.   metFORMIN 500 MG (MOD) 24 hr tablet Commonly known as:  GLUMETZA Take 500 mg by mouth every evening.   multivitamin tablet Take 1 tablet by mouth daily.   nortriptyline 50 MG capsule Commonly known as:  PAMELOR Take 50 mg by mouth at bedtime.   oxyCODONE-acetaminophen 5-325 MG tablet Commonly known as:  PERCOCET/ROXICET Take 1-2 tablets by mouth every 6 (six) hours as needed. What changed:  when to take this   potassium chloride SA 20 MEQ tablet Commonly known as:  K-DUR,KLOR-CON Take 20 mEq by mouth 2 (two) times daily.   sildenafil 100 MG tablet Commonly known as:  VIAGRA Take 100 mg by mouth daily as needed for erectile dysfunction.   tiotropium 18 MCG inhalation capsule Commonly known as:  SPIRIVA Place 18 mcg into inhaler and inhale daily.   traZODone 100 MG tablet Commonly known as:  DESYREL Take 100 mg by mouth at bedtime.      Follow-up Information    Coralie Keens,  MD Follow up in 3 week(s).   Specialty:  General Surgery Contact information: [address] [place] [address] [phone]           Signed: Harl Bowie 01/30/2017, 7:21 AM

## 2017-01-30 NOTE — Anesthesia Postprocedure Evaluation (Signed)
Anesthesia Post Note  Patient: Mitchell [name]  Procedure(s) Performed: Procedure(s) (LRB): RIGHT GROIN EXPLORATION, ABLATION OF RIGHT ILEO-INGUINAL NERVE (Right)     Patient location during evaluation: PACU Anesthesia Type: General and Regional Level of consciousness: awake and alert Pain management: pain level controlled Vital Signs Assessment: post-procedure vital signs reviewed and stable Respiratory status: spontaneous breathing, nonlabored ventilation and respiratory function stable Cardiovascular status: blood pressure returned to baseline and stable Postop Assessment: no signs of nausea or vomiting Anesthetic complications: no    Last Vitals:  Vitals:   01/29/17 2152 01/30/17 0629  BP: 133/61 123/60  Pulse: 63 66  Resp: 18 19  Temp: 36.8 C 36.7 C  SpO2: 98% 98%    Last Pain:  Vitals:   01/30/17 0811  TempSrc:   PainSc: 5                  Lataysha Vohra,W. EDMOND

## 2017-01-30 NOTE — Progress Notes (Signed)
Patient ID: Mitchell [name], male   DOB: Nov 04, 1945, 71 y.o.   MRN: [id number]   Doing well Comfortable Abdomen soft, incision clean  Plan Discharge home

## 2017-01-30 NOTE — Progress Notes (Signed)
Right groin incision with skin glue dry and intact. Pain is controlled. Discharge instructions was given to pt. Discharged to home accompanied by a friend.

## 2017-02-14 ENCOUNTER — Encounter: Payer: Self-pay | Admitting: *Deleted

## 2017-02-23 ENCOUNTER — Other Ambulatory Visit: Payer: Self-pay | Admitting: Cardiology

## 2017-02-23 DIAGNOSIS — I2510 Atherosclerotic heart disease of native coronary artery without angina pectoris: Secondary | ICD-10-CM

## 2017-02-24 ENCOUNTER — Inpatient Hospital Stay: Admission: RE | Admit: 2017-02-24 | Payer: Medicare Other | Source: Ambulatory Visit

## 2017-02-24 ENCOUNTER — Ambulatory Visit: Payer: Medicare Other | Admitting: Surgery

## 2017-03-07 ENCOUNTER — Ambulatory Visit (INDEPENDENT_AMBULATORY_CARE_PROVIDER_SITE_OTHER): Payer: Medicare Other | Admitting: Internal Medicine

## 2017-03-07 ENCOUNTER — Encounter: Payer: Self-pay | Admitting: Internal Medicine

## 2017-03-07 ENCOUNTER — Other Ambulatory Visit: Payer: Self-pay | Admitting: Surgery

## 2017-03-07 VITALS — BP 122/62 | Ht 72.0 in | Wt 170.4 lb

## 2017-03-07 DIAGNOSIS — I714 Abdominal aortic aneurysm, without rupture, unspecified: Secondary | ICD-10-CM

## 2017-03-07 DIAGNOSIS — I739 Peripheral vascular disease, unspecified: Secondary | ICD-10-CM

## 2017-03-07 DIAGNOSIS — I779 Disorder of arteries and arterioles, unspecified: Secondary | ICD-10-CM | POA: Diagnosis not present

## 2017-03-07 DIAGNOSIS — E785 Hyperlipidemia, unspecified: Secondary | ICD-10-CM | POA: Diagnosis not present

## 2017-03-07 DIAGNOSIS — K4090 Unilateral inguinal hernia, without obstruction or gangrene, not specified as recurrent: Secondary | ICD-10-CM | POA: Diagnosis not present

## 2017-03-07 DIAGNOSIS — R1031 Right lower quadrant pain: Secondary | ICD-10-CM

## 2017-03-07 DIAGNOSIS — I10 Essential (primary) hypertension: Secondary | ICD-10-CM

## 2017-03-07 DIAGNOSIS — I2510 Atherosclerotic heart disease of native coronary artery without angina pectoris: Secondary | ICD-10-CM | POA: Diagnosis not present

## 2017-03-07 DIAGNOSIS — R42 Dizziness and giddiness: Secondary | ICD-10-CM

## 2017-03-07 NOTE — Progress Notes (Signed)
Follow-up Outpatient Visit Date: 03/07/2017  Primary Care Provider: Leonard Downing, MD [place] [address]  Chief Complaint: Right lower quadrant/groin pain  HPI:  Mitchell [name] is a 71 y.o. year-old male with history of coronary artery disease status post CABG, AAA status post repair, hypertension, and type 2 diabetes mellitus, who presents for follow-up of coronary artery disease and. I last saw him in May, at which he was doing well with only mild dizziness. We had previously discontinued chlorthalidone due to orthostatic lightheadedness. Today, [name] reports feeling well from a heart standpoint. He denies chest pain, palpitations, lightheadedness, and edema. He has stable exertional dyspnea and very brief lightheadedness. He is no longer had orthostatic lightheadedness after discontinuation of chlorthalidone. He reports home blood pressures of 90-120/60-80.  [name] is most concerned about severe right groin pain that developed after revision of a previous right inguinal hernia repair. Now taking hydromorphone with incomplete pain relief.  -------------------------------------------------------------------------------------------------- Pertinent Past Medical History: 1. Coronary artery disease. The patient had non-ST-elevation MI in February 2008 and there was an 80-90% proximal LAD stenosis, 90%proximal first diagonal stenosis, 99% ostial ramus stenosis. The first obtuse marginal was subtotally occluded and there was an 80% mid RCA stenosis. EF was estimated 25% on ventriculogram, coronary artery bypass grafting was done with a LIMA to the LAD, sequential saphenous vein graft to the PDA, and PLOM, saphenous graft to the diagonal, saphenous vein graft to the ramus. - Cardiolite (12/15) with EF 52%, no ischemia.  2. Aortic insufficiency: Echo (3/11): EF 55-60%, moderate diastolic dysfunction, moderate aortic insufficiency, moderate LAE. Echo (3/12) with  EF 50-55%, grade II diastolic dysfunction, mild MR, mild aortic insufficiency. Echo (9/15) with EF 55-60%, mild AI, mild MR, mildly dilated RV with normal systolic function.  3. Infrarenal AAA. Abdominal US 3/11 with AAA 3.9 x 3.9 cm. Abdominal US 3/12 with AAA 3.8 x 3.9 cm. Abdominal US (3/13) with AAA 4.6 x 3.9 with complex plaque within the AAA. CTA abdomen showed 4 cm saccular infrarenal aneurysm. CTA abdomen 5/14 showed saccular AAA grown to 5.3 cm. EVAR for repair in 6/14.  4. Hypertension: Renal artery dopplers in 5/13 with no significant renal artery stenosis.  5. Type 2 diabetes. 6. History of torn left rotator cuff. 7. History of obstructive/restrictive PFTs. There is some reversibility with bronchodilator. The patient actually never smoked. He is on Spiriva. 8. Holter (3/10): Frequent PACs and blocked PACs. PVCs with trigeminy. No long pauses. Slowest HR in 40s while asleep.  9. Carotid stenosis. Carotid dopplers (3/12) with stable 40-59% bilateral ICA stenosis. Carotid dopplers (3/13) with 40-59% bilateral stenosis. Carotid dopplers (3/14) with 40-59% bilateral stenosis. Carotid dopplers (3/15) with 40-59% bilateral ICA stenosis. - Carotid dopplers (3/17): 40-59% BICA stenosis.  - Carotid dopplers (3/18): 1-39% BICA stenosis. >50% stenosis in BECAs. 10. Melanoma: s/p excision from back 5/12.  11. Right inguinal hernia s/p repair (11/17) and revision (8/17)  Recent CV Pertinent Labs: Lab Results  Component Value Date   CHOL 120 (L) 03/04/2016   HDL 32 (L) 03/04/2016   LDLCALC 59 03/04/2016   TRIG 144 03/04/2016   CHOLHDL 3.8 03/04/2016   INR 1.06 12/11/2012   K 4.2 01/30/2017   K 4.3 10/23/2015   MG 2.1 12/11/2012   BUN 13 01/30/2017   BUN 16 10/24/2016   BUN 19.6 10/23/2015   CREATININE 1.02 01/30/2017   CREATININE 1.26 (H) 03/04/2016   CREATININE 1.4 (H) 10/23/2015    Past medical  and surgical history were reviewed and updated in EPIC.  Current Meds    Medication Sig  . amLODipine (NORVASC) 10 MG tablet Take 10 mg by mouth daily.   Marland Kitchen aspirin 81 MG chewable tablet Chew 81 mg by mouth at bedtime.   Marland Kitchen atorvastatin (LIPITOR) 40 MG tablet Take 40 mg by mouth at bedtime.   . benazepril (LOTENSIN) 40 MG tablet Take 40 mg by mouth daily.   . carvedilol (COREG) 25 MG tablet Take 1 tablet (25 mg total) by mouth 2 (two) times daily.  . cholecalciferol (VITAMIN D) 1000 UNITS tablet Take 1,000 Units by mouth daily.  . clopidogrel (PLAVIX) 75 MG tablet TAKE ONE TABLET BY MOUTH ONCE DAILY  . Coenzyme Q10 200 MG capsule Take 200 mg by mouth at bedtime.   . docusate sodium (COLACE) 100 MG capsule Take 100 mg by mouth 2 (two) times daily as needed for mild constipation.  Marland Kitchen glipiZIDE (GLUCOTROL) 10 MG tablet Take 5 mg by mouth 2 (two) times daily.   Marland Kitchen HYDROmorphone (DILAUDID) 4 MG tablet Take 4 mg by mouth every 6 (six) hours as needed. AS NEEDED FOR PAIN  . metFORMIN (GLUMETZA) 500 MG (MOD) 24 hr tablet Take 500 mg by mouth every evening.   . Multiple Vitamin (MULTIVITAMIN) tablet Take 1 tablet by mouth daily.    . nortriptyline (PAMELOR) 50 MG capsule Take 50 mg by mouth at bedtime.  . Omega-3 Fatty Acids (FISH OIL TRIPLE STRENGTH) 1400 MG CAPS Take 1,400 mg by mouth every morning.  . potassium chloride SA (K-DUR,KLOR-CON) 20 MEQ tablet Take 20 mEq by mouth 2 (two) times daily.   . sildenafil (VIAGRA) 100 MG tablet Take 100 mg by mouth daily as needed for erectile dysfunction.  Marland Kitchen tiotropium (SPIRIVA) 18 MCG inhalation capsule Place 18 mcg into inhaler and inhale daily.    . traZODone (DESYREL) 100 MG tablet Take 100 mg by mouth at bedtime.   . VOLTAREN 1 % GEL Apply topically 2 (two) times daily as needed. AS NEEDED TO AFFECTED AREA    Allergies: Gabapentin; Zocor [simvastatin]; and Tape  Social History   Social History  . Marital status: Divorced    Spouse name: N/A  . Number of children: N/A  . Years of education: N/A   Occupational History  .  retired    Social History Main Topics  . Smoking status: Never Smoker  . Smokeless tobacco: Never Used  . Alcohol use No  . Drug use: No  . Sexual activity: Not on file   Other Topics Concern  . Not on file   Social History Narrative  . No narrative on file    Family History  Problem Relation Age of Onset  . Hyperlipidemia Mother   . Hypertension Mother   . Heart disease Mother   . AAA (abdominal aortic aneurysm) Mother   . Hypertension Father   . AAA (abdominal aortic aneurysm) Father   . Heart disease Father   . Hyperlipidemia Father   . Throat cancer Maternal Uncle     Review of Systems: A 12-system review of systems was performed and was negative except as noted in the HPI.  --------------------------------------------------------------------------------------------------  Physical Exam: BP 122/62   Ht 6' (1.829 m)   Wt 170 lb 6.4 oz (77.3 kg)   BMI 23.11 kg/m   General:  Well-developed, well-nourished man, seated comfortably in the exam room. HEENT: No conjunctival pallor or scleral icterus. Moist mucous membranes.  OP clear. Neck: Supple without  lymphadenopathy, thyromegaly, JVD, or HJR. No carotid bruit. Lungs: Normal work of breathing. Clear to auscultation bilaterally without wheezes or crackles. Heart: Regular rate and rhythm without murmurs, rubs, or gallops. Non-displaced PMI. Abd: Bowel sounds present. Soft, NT/ND without hepatosplenomegaly. Right groin hernia scar is well-healed with mild tenderness. Ext: No lower extremity edema. Radial, PT, and DP pulses are 2+ bilaterally. Skin: Warm and dry without rash.  EKG:  Normal sinus rhythm without abnormalities.  Lab Results  Component Value Date   WBC 8.1 01/30/2017   HGB 11.4 (L) 01/30/2017   HCT 34.0 (L) 01/30/2017   MCV 95.0 01/30/2017   PLT 172 01/30/2017    Lab Results  Component Value Date   NA 139 01/30/2017   K 4.2 01/30/2017   CL 105 01/30/2017   CO2 27 01/30/2017   BUN 13  01/30/2017   CREATININE 1.02 01/30/2017   GLUCOSE 100 (H) 01/30/2017   ALT 18 04/16/2016    Lab Results  Component Value Date   CHOL 120 (L) 03/04/2016   HDL 32 (L) 03/04/2016   LDLCALC 59 03/04/2016   TRIG 144 03/04/2016   CHOLHDL 3.8 03/04/2016    --------------------------------------------------------------------------------------------------  ASSESSMENT AND PLAN: Coronary artery disease No chest pain or other symptoms to suggest worsening coronary insufficiency. He had history of extensive vascular disease, we will continue with indefinite dual antiplatelet therapy.  Orthostatic lightheadedness This has resolved. Continue current medications.  Hypertension Blood pressure is well controlled today. We will not make any medication changes at this time.  Hyperlipidemia Goal LDL is less than 70. It has been a year since Mr. Andrews last lipid panel. We will have him return for a fasting lipid panel and ALT at his convenience. Continue with atorvastatin 40 mg daily pending the results.  Right inguinal hernia This remains quite painful. I will defer further management to Dr. Ninfa Linden.  Carotid artery disease Moderate carotid artery disease noted on prior duplex. Plan for repeat study in about 6 months.  Abdominal aortic aneurysm Prior imaging showed type II endoleak. Mr. Chiou should continue routine follow-up with Dr. Trula Slade.  Follow-up: Return to clinic in 6 months.  Nelva Bush, MD 03/07/2017 1:37 PM

## 2017-03-07 NOTE — Patient Instructions (Addendum)
Medication Instructions:  Your physician recommends that you continue on your current medications as directed. Please refer to the Current Medication list given to you today.   Labwork: Your physician recommends that you return for a FASTING lipid profile /ALT. This is scheduled for Wednesday October 2,2018. The lab is open from 7:30 AM-5PM. Do not eat or drink after midnight the night before the lab.   Testing/Procedures: None   Follow-Up: Your physician wants you to follow-up in: 6 months with Dr End. (March 2019).  You will receive a reminder letter in the mail two months in advance. If you don't receive a letter, please call our office to schedule the follow-up appointment.        If you need a refill on your cardiac medications before your next appointment, please call your pharmacy.

## 2017-03-10 ENCOUNTER — Ambulatory Visit
Admission: RE | Admit: 2017-03-10 | Discharge: 2017-03-10 | Disposition: A | Discharge status: Home / Self Care | Payer: Medicare Other | Source: Ambulatory Visit | Attending: Surgery | Admitting: Surgery

## 2017-03-10 DIAGNOSIS — R1031 Right lower quadrant pain: Secondary | ICD-10-CM

## 2017-03-10 MED ORDER — GADOBENATE DIMEGLUMINE 529 MG/ML IV SOLN
15.0000 mL | Freq: Once | INTRAVENOUS | Status: AC | PRN
  Administered 2017-03-10: 15 mL via INTRAVENOUS

## 2017-03-18 ENCOUNTER — Other Ambulatory Visit: Payer: Medicare Other

## 2017-03-18 DIAGNOSIS — I2510 Atherosclerotic heart disease of native coronary artery without angina pectoris: Secondary | ICD-10-CM

## 2017-03-18 DIAGNOSIS — E785 Hyperlipidemia, unspecified: Secondary | ICD-10-CM

## 2017-03-18 LAB — LIPID PANEL
CHOL/HDL RATIO: 3.5 ratio (ref 0.0–5.0)
Cholesterol, Total: 109 mg/dL (ref 100–199)
HDL: 31 mg/dL — ABNORMAL LOW (ref >39)
LDL CALC: 58 mg/dL (ref 0–99)
TRIGLYCERIDES: 99 mg/dL (ref 0–149)
VLDL CHOLESTEROL CAL: 20 mg/dL (ref 5–40)

## 2017-03-18 LAB — ALT: ALT: 11 IU/L (ref 0–44)

## 2017-03-19 ENCOUNTER — Other Ambulatory Visit: Payer: Medicare Other

## 2017-04-07 ENCOUNTER — Other Ambulatory Visit: Payer: Self-pay | Admitting: *Deleted

## 2017-04-07 DIAGNOSIS — I6523 Occlusion and stenosis of bilateral carotid arteries: Secondary | ICD-10-CM

## 2017-04-21 ENCOUNTER — Encounter: Payer: Self-pay | Admitting: Surgery

## 2017-04-21 ENCOUNTER — Ambulatory Visit
Admission: RE | Admit: 2017-04-21 | Discharge: 2017-04-21 | Disposition: A | Discharge status: Home / Self Care | Payer: Medicare Other | Source: Ambulatory Visit | Attending: Surgery | Admitting: Surgery

## 2017-04-21 ENCOUNTER — Ambulatory Visit (INDEPENDENT_AMBULATORY_CARE_PROVIDER_SITE_OTHER): Payer: Medicare Other | Admitting: Surgery

## 2017-04-21 VITALS — BP 113/69 | HR 61 | Temp 98.1°F | Resp 16 | Ht 72.0 in | Wt 176.0 lb

## 2017-04-21 DIAGNOSIS — I6523 Occlusion and stenosis of bilateral carotid arteries: Secondary | ICD-10-CM

## 2017-04-21 DIAGNOSIS — I714 Abdominal aortic aneurysm, without rupture, unspecified: Secondary | ICD-10-CM

## 2017-04-21 MED ORDER — IOPAMIDOL (ISOVUE-370) INJECTION 76%
75.0000 mL | Freq: Once | INTRAVENOUS | Status: AC | PRN
  Administered 2017-04-21: 75 mL via INTRAVENOUS

## 2017-04-21 NOTE — Progress Notes (Signed)
[facility] [place]  Patient name: Mitchell [name] MRN: [id number] DOB: 28-Mar-1946 Sex: male   REASON FOR VISIT:    Follow up AAA  HISOTRY OF PRESENT ILLNESS:    Mitchell [name] is a 71 y.o. male who returns today for follow-up.  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  He has a known type II endoleak.  He has had significant difficulty with right inguinal pain following hernia repair.  PAST MEDICAL HISTORY:   Past Medical History:  Diagnosis Date  . AAA (abdominal aortic aneurysm) ([place])    ultrasound done in 3/11 and showed AAA 3.9 x 3.9cm  . Altered taste   . Anemia    3 units of packed cells given post surgery for melanoma .  Marland Kitchen Anxiety   . Aortic insufficiency   . Arthritis    arm,   . CAD (coronary artery disease)    patient had non-ST-elevation MI in February 2008 and there was an 80-90% proximal LAD stenosis, 90% proximal first diagonal stenosis, 99% ostial ramus stenosis. The first obtuse marginal was subtotally occluded and there was an 80% mid RCA stenosis. EF was estimated 25% on ventriculogram, coronary artery bypass grafting was done with a LIMA to the LAD, sequential saphenous vein graft to the PDA,  . Cancer ([place])    melanoma  . Carotid stenosis    Carotid US 3/17: bilat ICA 40-59% >> FU 1 year  . CHF (congestive heart failure) ([place]) 2008  . COPD (chronic obstructive pulmonary disease) ([place])   . Depression   . Diabetes mellitus   . DM2 (diabetes mellitus, type 2) ([place])   . History of PFTs    obstructive/restrictive. There is some reversibility with bronchodilator. The patient actually never smoked. He is on Spiriva  . HTN (hypertension)   . Hx of prostate biopsy    being watched carefully by Dr. Hartley Barefoot, pt. can't have MRI due to stent- per pt.    . Irregular heart beat   . Melanoma ([place]) 09/2010   pT2a N0 M0; clark level IV.   Marland Kitchen Myocardial infarction ([place])  2008  . Neuromuscular disorder ([place])    numbness of L arm, lack of coordination, pt.told that its related to shoulder, post 4/16 RCT surgery    . Pain    back  . Range of motion deficit 09/19/2011  . Shortness of breath    with exeration  . Torn rotator cuff    hx of. left      FAMILY HISTORY:   Family History  Problem Relation Age of Onset  . Hyperlipidemia Mother   . Hypertension Mother   . Heart disease Mother   . AAA (abdominal aortic aneurysm) Mother   . Hypertension Father   . AAA (abdominal aortic aneurysm) Father   . Heart disease Father   . Hyperlipidemia Father   . Throat cancer Maternal Uncle     SOCIAL HISTORY:   Social History   Tobacco Use  . Smoking status: Never Smoker  . Smokeless tobacco: Never Used  Substance Use Topics  . Alcohol use: No    Alcohol/week: 0.0 oz     ALLERGIES:   Allergies  Allergen Reactions  . Gabapentin Other (See Comments)    Tremors  . Zocor [Simvastatin] Other (See Comments)    Bone Pain  . Tape Rash and Other (See Comments)    REDNESS     CURRENT MEDICATIONS:   Current  Outpatient Medications  Medication Sig Dispense Refill  . amLODipine (NORVASC) 10 MG tablet Take 10 mg by mouth daily.     Marland Kitchen aspirin 81 MG chewable tablet Chew 81 mg by mouth at bedtime.     Marland Kitchen atorvastatin (LIPITOR) 40 MG tablet Take 40 mg by mouth at bedtime.     . benazepril (LOTENSIN) 40 MG tablet Take 40 mg by mouth daily.     . carvedilol (COREG) 25 MG tablet Take 1 tablet (25 mg total) by mouth 2 (two) times daily.    . cholecalciferol (VITAMIN D) 1000 UNITS tablet Take 1,000 Units by mouth daily.    . clopidogrel (PLAVIX) 75 MG tablet TAKE ONE TABLET BY MOUTH ONCE DAILY 90 tablet 2  . Coenzyme Q10 200 MG capsule Take 200 mg by mouth at bedtime.     . docusate sodium (COLACE) 100 MG capsule Take 100 mg by mouth 2 (two) times daily as needed for mild constipation.    Marland Kitchen glipiZIDE (GLUCOTROL) 10 MG tablet Take 5 mg by mouth 2 (two) times  daily.     . metFORMIN (GLUMETZA) 500 MG (MOD) 24 hr tablet Take 500 mg by mouth every evening.     . Multiple Vitamin (MULTIVITAMIN) tablet Take 1 tablet by mouth daily.      . nortriptyline (PAMELOR) 50 MG capsule Take 50 mg by mouth at bedtime.    . Omega-3 Fatty Acids (FISH OIL TRIPLE STRENGTH) 1400 MG CAPS Take 1,400 mg by mouth every morning.    Marland Kitchen oxyCODONE-acetaminophen (PERCOCET/ROXICET) 5-325 MG tablet Take every 4 (four) hours as needed by mouth for severe pain.    . potassium chloride SA (K-DUR,KLOR-CON) 20 MEQ tablet Take 20 mEq by mouth 2 (two) times daily.     . sildenafil (VIAGRA) 100 MG tablet Take 100 mg by mouth daily as needed for erectile dysfunction.    Marland Kitchen tiotropium (SPIRIVA) 18 MCG inhalation capsule Place 18 mcg into inhaler and inhale daily.      . traZODone (DESYREL) 100 MG tablet Take 100 mg by mouth at bedtime.     . VOLTAREN 1 % GEL Apply topically 2 (two) times daily as needed. AS NEEDED TO AFFECTED AREA    . HYDROmorphone (DILAUDID) 4 MG tablet Take 4 mg by mouth every 6 (six) hours as needed. AS NEEDED FOR PAIN     No current facility-administered medications for this visit.     REVIEW OF SYSTEMS:   [X]  denotes positive finding, [ ]  denotes negative finding Cardiac  Comments:  Chest pain or chest pressure:    Shortness of breath upon exertion: x   Short of breath when lying flat:    Irregular heart rhythm:        Vascular    Pain in calf, thigh, or hip brought on by ambulation:    Pain in feet at night that wakes you up from your sleep:     Blood clot in your veins:    Leg swelling:         Pulmonary    Oxygen at home:    Productive cough:     Wheezing:         Neurologic    Sudden weakness in arms or legs:     Sudden numbness in arms or legs:     Sudden onset of difficulty speaking or slurred speech:    Temporary loss of vision in one eye:     Problems with dizziness:  x  Gastrointestinal    Blood in stool:     Vomited blood:           Genitourinary    Burning when urinating:     Blood in urine:        Psychiatric    Major depression:         Hematologic    Bleeding problems:    Problems with blood clotting too easily:        Skin    Rashes or ulcers:        Constitutional    Fever or chills:      PHYSICAL EXAM:   Vitals:   04/21/17 1017  BP: 113/69  Pulse: 61  Resp: 16  Temp: 98.1 F (36.7 C)  TempSrc: Oral  SpO2: 99%  Weight: 176 lb (79.8 kg)  Height: 6' (1.829 m)    GENERAL: The patient is a well-nourished male, in no acute distress. The vital signs are documented above. CARDIAC: There is a regular rate and rhythm.  VASCULAR: Palpable posterior tibial pulse bilaterally. PULMONARY: Non-labored respirations ABDOMEN: Soft and non-tender with no pulsatile mass MUSCULOSKELETAL: There are no major deformities or cyanosis. NEUROLOGIC: No focal weakness or paresthesias are detected. SKIN: There are no ulcers or rashes noted. PSYCHIATRIC: The patient has a normal affect.  STUDIES:   I have reviewed his CT angiogram.  This has not been formally read by radiology.  I do not see any endoleak.  MEDICAL ISSUES:   AAA: I will await the final results of the CT angiogram.  I have scheduled him for follow-up in 1 year.  This will be sooner if there are any abnormal findings on his CT scan today.       Annamarie Major, MD [facility] [phone] Pager [phone]

## 2017-04-30 NOTE — Addendum Note (Signed)
Addended by: Lianne Cure A on: 04/30/2017 10:45 AM   Modules accepted: Orders

## 2017-08-18 ENCOUNTER — Ambulatory Visit (HOSPITAL_COMMUNITY)
Admission: RE | Admit: 2017-08-18 | Discharge: 2017-08-18 | Disposition: A | Discharge status: Home / Self Care | Payer: Medicare Other | Source: Ambulatory Visit | Attending: Cardiology | Admitting: Cardiology

## 2017-08-18 DIAGNOSIS — I6523 Occlusion and stenosis of bilateral carotid arteries: Secondary | ICD-10-CM | POA: Diagnosis present

## 2017-08-18 DIAGNOSIS — I10 Essential (primary) hypertension: Secondary | ICD-10-CM | POA: Insufficient documentation

## 2017-08-18 DIAGNOSIS — E785 Hyperlipidemia, unspecified: Secondary | ICD-10-CM | POA: Insufficient documentation

## 2017-08-18 DIAGNOSIS — I2510 Atherosclerotic heart disease of native coronary artery without angina pectoris: Secondary | ICD-10-CM | POA: Diagnosis not present

## 2017-09-22 ENCOUNTER — Ambulatory Visit (INDEPENDENT_AMBULATORY_CARE_PROVIDER_SITE_OTHER): Payer: Medicare Other | Admitting: Internal Medicine

## 2017-09-22 ENCOUNTER — Encounter: Payer: Self-pay | Admitting: Internal Medicine

## 2017-09-22 VITALS — BP 140/72 | HR 72 | Ht 72.0 in | Wt 188.0 lb

## 2017-09-22 DIAGNOSIS — I10 Essential (primary) hypertension: Secondary | ICD-10-CM | POA: Diagnosis not present

## 2017-09-22 DIAGNOSIS — I2510 Atherosclerotic heart disease of native coronary artery without angina pectoris: Secondary | ICD-10-CM

## 2017-09-22 DIAGNOSIS — I5032 Chronic diastolic (congestive) heart failure: Secondary | ICD-10-CM

## 2017-09-22 DIAGNOSIS — E785 Hyperlipidemia, unspecified: Secondary | ICD-10-CM

## 2017-09-22 DIAGNOSIS — I714 Abdominal aortic aneurysm, without rupture, unspecified: Secondary | ICD-10-CM

## 2017-09-22 DIAGNOSIS — I351 Nonrheumatic aortic (valve) insufficiency: Secondary | ICD-10-CM

## 2017-09-22 DIAGNOSIS — R1031 Right lower quadrant pain: Secondary | ICD-10-CM | POA: Diagnosis not present

## 2017-09-22 MED ORDER — FUROSEMIDE 20 MG PO TABS: 20.0000 mg | ORAL_TABLET | Freq: Every day | ORAL | 3 refills | Status: DC

## 2017-09-22 NOTE — Progress Notes (Signed)
Follow-up Outpatient Visit Date: 09/22/2017  Primary Care Provider: Leonard Downing, MD [name] [id number]  Chief Complaint: Right groin pain  HPI:  Mr. Mitchell [name] is a 72 y.o. year-old male with history of coronary artery disease status post CABG, AAA status post repair, hypertension, and type 2 diabetes mellitus, who presents for follow-up of CAD.  I last saw Mitchell [name] in September, at which time he was doing well from a heart standpoint.  He was plagued by severe right groin pain following inguinal hernia repair.  He has continued to have significant pain in the right groin.  He follows closely with Dr. Ninfa Linden and is currently awaiting a pain management consultation.  Mitchell [name] denies chest pain, shortness of breath, palpitations, lightheadedness, orthopnea, and PND.  Is concerned about occasional leg swelling that developed since chlorthalidone was discontinued due to orthostatic lightheadedness.  He has put on several pounds since our last visit, which he attributes to an improved appetite.  --------------------------------------------------------------------------------------------------  PMH/PSH: 1. Coronary artery disease. The patient had non-ST-elevation MI in February 2008 and there was an 80-90% proximal LAD stenosis, 90%proximal first diagonal stenosis, 99% ostial ramus stenosis. The first obtuse marginal was subtotally occluded and there was an 80% mid RCA stenosis. EF was estimated 25% on ventriculogram, coronary artery bypass grafting was done with a LIMA to the LAD, sequential saphenous vein graft to the PDA, and PLOM, saphenous graft to the diagonal, saphenous vein graft to the ramus. - Cardiolite (12/15) with EF 52%, no ischemia.  2. Aortic insufficiency: Echo (3/11): EF 55-60%, moderate diastolic dysfunction, moderate aortic insufficiency, moderate LAE. Echo (3/12) with EF 50-55%, grade II diastolic dysfunction, mild MR, mild aortic  insufficiency. Echo (9/15) with EF 55-60%, mild AI, mild MR, mildly dilated RV with normal systolic function.  3. Infrarenal AAA. Abdominal US 3/11 with AAA 3.9 x 3.9 cm. Abdominal US 3/12 with AAA 3.8 x 3.9 cm. Abdominal US (3/13) with AAA 4.6 x 3.9 with complex plaque within the AAA. CTA abdomen showed 4 cm saccular infrarenal aneurysm. CTA abdomen 5/14 showed saccular AAA grown to 5.3 cm. EVAR for repair in 6/14.  4. Hypertension: Renal artery dopplers in 5/13 with no significant renal artery stenosis.  5. Type 2 diabetes. 6. History of torn left rotator cuff. 7. History of obstructive/restrictive PFTs. There is some reversibility with bronchodilator. The patient actually never smoked. He is on Spiriva. 8. Holter (3/10): Frequent PACs and blocked PACs. PVCs with trigeminy. No long pauses. Slowest HR in 40s while asleep.  9. Carotid stenosis. Carotid dopplers (3/12) with stable 40-59% bilateral ICA stenosis. Carotid dopplers (3/13) with 40-59% bilateral stenosis. Carotid dopplers (3/14) with 40-59% bilateral stenosis. Carotid dopplers (3/15) with 40-59% bilateral ICA stenosis. - Carotid dopplers (3/17): 40-59% BICA stenosis.  - Carotid dopplers (3/18): 1-39% BICA stenosis. >50% stenosis in BECAs. 10. Melanoma: s/p excision from back 5/12.  11. Right inguinal hernia s/p repair (11/17) and revision (8/17)   Recent CV Pertinent Labs: Lab Results  Component Value Date   CHOL 109 03/18/2017   HDL 31 (L) 03/18/2017   LDLCALC 58 03/18/2017   TRIG 99 03/18/2017   CHOLHDL 3.5 03/18/2017   CHOLHDL 3.8 03/04/2016   INR 1.06 12/11/2012   K 4.2 01/30/2017   K 4.3 10/23/2015   MG 2.1 12/11/2012   BUN 13 01/30/2017   BUN 16 10/24/2016   BUN 19.6 10/23/2015   CREATININE 1.02 01/30/2017   CREATININE 1.26 (H) 03/04/2016   CREATININE  1.4 (H) 10/23/2015    Past medical and surgical history were reviewed and updated in EPIC.  Current Meds  Medication Sig  . amLODipine (NORVASC) 10  MG tablet Take 10 mg by mouth daily.   Marland Kitchen aspirin 81 MG chewable tablet Chew 81 mg by mouth at bedtime.   Marland Kitchen atorvastatin (LIPITOR) 40 MG tablet Take 40 mg by mouth at bedtime.   . benazepril (LOTENSIN) 40 MG tablet Take 40 mg by mouth daily.   . carvedilol (COREG) 25 MG tablet Take 1 tablet (25 mg total) by mouth 2 (two) times daily.  . cholecalciferol (VITAMIN D) 1000 UNITS tablet Take 1,000 Units by mouth daily.  . clopidogrel (PLAVIX) 75 MG tablet TAKE ONE TABLET BY MOUTH ONCE DAILY  . Coenzyme Q10 200 MG capsule Take 200 mg by mouth at bedtime.   . docusate sodium (COLACE) 100 MG capsule Take 100 mg by mouth 2 (two) times daily as needed for mild constipation.  Marland Kitchen glipiZIDE (GLUCOTROL) 10 MG tablet Take 5 mg by mouth 2 (two) times daily.   . metFORMIN (GLUMETZA) 500 MG (MOD) 24 hr tablet Take 500 mg by mouth every evening.   . Multiple Vitamin (MULTIVITAMIN) tablet Take 1 tablet by mouth daily.    . nortriptyline (PAMELOR) 50 MG capsule Take 50 mg by mouth at bedtime.  . Omega-3 Fatty Acids (FISH OIL TRIPLE STRENGTH) 1400 MG CAPS Take 1,400 mg by mouth every morning.  Marland Kitchen oxyCODONE-acetaminophen (PERCOCET/ROXICET) 5-325 MG tablet Take every 4 (four) hours as needed by mouth for severe pain.  . potassium chloride SA (K-DUR,KLOR-CON) 20 MEQ tablet Take 20 mEq by mouth 2 (two) times daily.   . sildenafil (VIAGRA) 100 MG tablet Take 100 mg by mouth daily as needed for erectile dysfunction.  Marland Kitchen tiotropium (SPIRIVA) 18 MCG inhalation capsule Place 18 mcg into inhaler and inhale daily.    . traZODone (DESYREL) 100 MG tablet Take 100 mg by mouth at bedtime.   . VOLTAREN 1 % GEL Apply topically 2 (two) times daily as needed. AS NEEDED TO AFFECTED AREA    Allergies: Gabapentin; Zocor [simvastatin]; and Tape  Social History   Socioeconomic History  . Marital status: Divorced    Spouse name: Not on file  . Number of children: Not on file  . Years of education: Not on file  . Highest education  level: Not on file  Occupational History  . Occupation: retired  Scientific laboratory technician  . Financial resource strain: Not on file  . Food insecurity:    Worry: Not on file    Inability: Not on file  . Transportation needs:    Medical: Not on file    Non-medical: Not on file  Tobacco Use  . Smoking status: Never Smoker  . Smokeless tobacco: Never Used  Substance and Sexual Activity  . Alcohol use: No    Alcohol/week: 0.0 oz  . Drug use: No  . Sexual activity: Not on file  Lifestyle  . Physical activity:    Days per week: Not on file    Minutes per session: Not on file  . Stress: Not on file  Relationships  . Social connections:    Talks on phone: Not on file    Gets together: Not on file    Attends religious service: Not on file    Active member of club or organization: Not on file    Attends meetings of clubs or organizations: Not on file    Relationship status: Not on  file  . Intimate partner violence:    Fear of current or ex partner: Not on file    Emotionally abused: Not on file    Physically abused: Not on file    Forced sexual activity: Not on file  Other Topics Concern  . Not on file  Social History Narrative  . Not on file    Family History  Problem Relation Age of Onset  . Hyperlipidemia Mother   . Hypertension Mother   . Heart disease Mother   . AAA (abdominal aortic aneurysm) Mother   . Hypertension Father   . AAA (abdominal aortic aneurysm) Father   . Heart disease Father   . Hyperlipidemia Father   . Throat cancer Maternal Uncle     Review of Systems: A 12-system review of systems was performed and was negative except as noted in the HPI.  --------------------------------------------------------------------------------------------------  Physical Exam: BP 140/72   Pulse 72   Ht 6' (1.829 m)   Wt 188 lb (85.3 kg)   SpO2 93%   BMI 25.50 kg/m   General: NAD. HEENT: No conjunctival pallor or scleral icterus. Moist mucous membranes.  OP  clear. Neck: Supple without lymphadenopathy, thyromegaly, JVD, or HJR. Lungs: Normal work of breathing. Clear to auscultation bilaterally without wheezes or crackles. Heart: Regular rate and rhythm without murmurs, rubs, or gallops. Non-displaced PMI. Abd: Bowel sounds present.  Soft with right lower quadrant tenderness at site of prior inguinal hernia repair.  Nondistended.  No hepatosplenomegaly. Ext: No lower extremity edema. Radial, PT, and DP pulses are 2+ bilaterally. Skin: Warm and dry without rash.  Lab Results  Component Value Date   WBC 8.1 01/30/2017   HGB 11.4 (L) 01/30/2017   HCT 34.0 (L) 01/30/2017   MCV 95.0 01/30/2017   PLT 172 01/30/2017    Lab Results  Component Value Date   NA 139 01/30/2017   K 4.2 01/30/2017   CL 105 01/30/2017   CO2 27 01/30/2017   BUN 13 01/30/2017   CREATININE 1.02 01/30/2017   GLUCOSE 100 (H) 01/30/2017   ALT 11 03/18/2017    Lab Results  Component Value Date   CHOL 109 03/18/2017   HDL 31 (L) 03/18/2017   LDLCALC 58 03/18/2017   TRIG 99 03/18/2017   CHOLHDL 3.5 03/18/2017    --------------------------------------------------------------------------------------------------  ASSESSMENT AND PLAN: Coronary artery disease without angina No chest pain or significant dyspnea to suggest worsening coronary insufficiency.  Aspirin, clopidogrel, atorvastatin, and carvedilol for secondary prevention.  Chronic diastolic heart failure Mitchell [name] appears mildly volume overloaded with leg edema and weight gain from her last visit.  We have agreed to add furosemide 20 mg daily with a BMP in 1 week.  If his symptoms do not improve, we will need to repeat an echocardiogram.  Aortic regurgitation Mild AI noted on prior echo in 2015.  We will initiate furosemide, as above to improve blood pressure control.  If he has worsening heart failure symptoms, will need to repeat echocardiogram to evaluate for progression of his valvular heart  disease.  AAA Continue follow-up with Dr. Trula Slade.  Hypertension Blood pressure suboptimally controlled today.  We discussed restarting chlorthalidone versus adding furosemide given lower extremity edema.  We have opted to start furosemide 20 mg daily, with basic metabolic panel in about a week to ensure normal potassium and  Hyperlipidemia Most recent LDL in 03/2017 was at goal in October.  Continue atorvastatin 40 mg daily.  Right groin pain Present since hernia repair.  Continue follow-up with Dr. Ninfa Linden as well as pain management consultation.  Follow-up: Return to clinic in 3 months.  Nelva Bush, MD 09/23/2017 9:40 PM

## 2017-09-22 NOTE — Patient Instructions (Addendum)
Medication Instructions:  Start Lasix 20 mg daily by mouth   -- If you need a refill on your cardiac medications before your next appointment, please call your pharmacy. --  Labwork: BMET  1 week  Testing/Procedures: None ordered  Follow-Up: Your physician wants you to follow-up in: 3 MONTHS with Dr. Saunders Revel.    Thank you for choosing [facility]!!    Any Other Special Instructions Will Be Listed Below (If Applicable).

## 2017-09-23 ENCOUNTER — Encounter: Payer: Self-pay | Admitting: Internal Medicine

## 2017-09-23 DIAGNOSIS — I2510 Atherosclerotic heart disease of native coronary artery without angina pectoris: Secondary | ICD-10-CM | POA: Insufficient documentation

## 2017-09-23 DIAGNOSIS — I351 Nonrheumatic aortic (valve) insufficiency: Secondary | ICD-10-CM | POA: Insufficient documentation

## 2017-09-29 ENCOUNTER — Other Ambulatory Visit: Payer: Medicare Other | Admitting: *Deleted

## 2017-09-30 ENCOUNTER — Other Ambulatory Visit: Payer: Self-pay | Admitting: *Deleted

## 2017-09-30 LAB — BASIC METABOLIC PANEL
BUN/Creatinine Ratio: 24 (ref 10–24)
BUN: 31 mg/dL — ABNORMAL HIGH (ref 8–27)
CALCIUM: 9.1 mg/dL (ref 8.6–10.2)
CHLORIDE: 101 mmol/L (ref 96–106)
CO2: 23 mmol/L (ref 20–29)
Creatinine, Ser: 1.3 mg/dL — ABNORMAL HIGH (ref 0.76–1.27)
GFR calc Af Amer: 63 mL/min/{1.73_m2} (ref >59)
GFR calc non Af Amer: 55 mL/min/{1.73_m2} — ABNORMAL LOW (ref >59)
GLUCOSE: 63 mg/dL — AB (ref 65–99)
POTASSIUM: 4.3 mmol/L (ref 3.5–5.2)
SODIUM: 139 mmol/L (ref 134–144)

## 2017-09-30 MED ORDER — FUROSEMIDE 20 MG PO TABS: ORAL_TABLET | ORAL | 3 refills | Status: AC

## 2017-10-17 ENCOUNTER — Other Ambulatory Visit: Payer: Self-pay | Admitting: Surgery

## 2017-10-30 NOTE — Pre-Procedure Instructions (Addendum)
VARDAAN DEPASCALE  10/30/2017      [place] ([place]), [place] [address] [place] ([place]) [place] [address] Phone: [phone] Fax: [phone]    Your procedure is scheduled on  Wednesday 11/05/17  Report to [facility] Admitting at 745 A.M.  Call this number if you have problems the morning of surgery:  [phone]   Remember:  No food OR DRINK after midnight.      Take these medicines the morning of surgery with A SIP OF WATER - AMLODIPINE (NORVASC), CARVEDILOL (COREG), OXYCODONE IF NEEDED, SPIRIVA  7 days prior to surgery STOP taking any Aspirin(unless otherwise instructed by your surgeon), Aleve, Naproxen, Ibuprofen, Motrin, Advil, Goody's, BC's, all herbal medications, fish oil, and all vitamins  STOP ASPIRIN AND PLAVIX AS DIRECTED BY PHYSICIAN.    How to Manage Your Diabetes Before and After Surgery  Why is it important to control my blood sugar before and after surgery? . Improving blood sugar levels before and after surgery helps healing and can limit problems. . A way of improving blood sugar control is eating a healthy diet by: o  Eating less sugar and carbohydrates o  Increasing activity/exercise o  Talking with your doctor about reaching your blood sugar goals . High blood sugars (greater than 180 mg/dL) can raise your risk of infections and slow your recovery, so you will need to focus on controlling your diabetes during the weeks before surgery. . Make sure that the doctor who takes care of your diabetes knows about your planned surgery including the date and location.  How do I manage my blood sugar before surgery? . Check your blood sugar at least 4 times a day, starting 2 days before surgery, to make sure that the level is not too high or low. o Check your blood sugar the morning of your surgery when you wake up and every 2 hours until you get to the Short Stay unit. . If your blood sugar is less than  70 mg/dL, you will need to treat for low blood sugar: o Do not take insulin. o Treat a low blood sugar (less than 70 mg/dL) with  cup of clear juice (cranberry or apple), 4 glucose tablets, OR glucose gel. Recheck blood sugar in 15 minutes after treatment (to make sure it is greater than 70 mg/dL). If your blood sugar is not greater than 70 mg/dL on recheck, call [phone] o  for further instructions. . Report your blood sugar to the short stay nurse when you get to Short Stay.  . If you are admitted to the hospital after surgery: o Your blood sugar will be checked by the staff and you will probably be given insulin after surgery (instead of oral diabetes medicines) to make sure you have good blood sugar levels. o The goal for blood sugar control after surgery is 80-180 mg/dL.              WHAT DO I DO ABOUT MY DIABETES MEDICATION?   Marland Kitchen Do not take oral diabetes medicines (pills) the morning of surgery.  . .  Other Instructions:          Patient Signature:  Date:   Nurse Signature:  Date:   Reviewed and Endorsed by [place] Patient Education Committee, August 2015  Do not wear jewelry, make-up or nail polish.  Do not wear lotions, powders, or perfumes, or deodorant.  Do not shave 48 hours  prior to surgery.  Men may shave face and neck.  Do not bring valuables to the hospital.  [facility] is not responsible for any belongings or valuables.  Contacts, dentures or bridgework may not be worn into surgery.  Leave your suitcase in the car.  After surgery it may be brought to your room.  For patients admitted to the hospital, discharge time will be determined by your treatment team.  Patients discharged the day of surgery will not be allowed to drive home.   Name and phone number of your driver:    Special instructions:  [place] - Preparing for Surgery  Before surgery, you can play an important role.  Because skin is not sterile, your skin needs to be as  free of germs as possible.  You can reduce the number of germs on you skin by washing with CHG (chlorahexidine gluconate) soap before surgery.  CHG is an antiseptic cleaner which kills germs and bonds with the skin to continue killing germs even after washing.  Oral Hygiene is also important in reducing the risk of infection.  Remember to brush your teeth the morning of surgery.  Please DO NOT use if you have an allergy to CHG or antibacterial soaps.  If your skin becomes reddened/irritated stop using the CHG and inform your nurse when you arrive at Short Stay.  Do not shave (including legs and underarms) for at least 48 hours prior to the first CHG shower.  You may shave your face.  Please follow these instructions carefully:   1.  Shower with CHG Soap the night before surgery and the morning of Surgery.  2.  If you choose to wash your hair, wash your hair first as usual with your normal shampoo.  3.  After you shampoo, rinse your hair and body thoroughly to remove the shampoo. 4.  Use CHG as you would any other liquid soap.  You can apply chg directly to the skin and wash gently with a      scrungie or washcloth.           5.  Apply the CHG Soap to your body ONLY FROM THE NECK DOWN.   Do not use on open wounds or open sores. Avoid contact with your eyes, ears, mouth and genitals (private parts).  Wash genitals (private parts) with your normal soap.  6.  Wash thoroughly, paying special attention to the area where your surgery will be performed.  7.  Thoroughly rinse your body with warm water from the neck down.  8.  DO NOT shower/wash with your normal soap after using and rinsing off the CHG Soap.  9.  Pat yourself dry with a clean towel.            10.  Wear clean pajamas.            11.  Place clean sheets on your bed the night of your first shower and do not sleep with pets.  Day of Surgery  Do not apply any lotions/deoderants the morning of surgery.   Please wear clean clothes to the  hospital/surgery center. Remember to brush your teeth.     Please read over the following fact sheets that you were given. Surgical Site Infection Prevention

## 2017-10-31 ENCOUNTER — Encounter (HOSPITAL_COMMUNITY): Payer: Self-pay

## 2017-10-31 ENCOUNTER — Encounter (HOSPITAL_COMMUNITY)
Admission: RE | Admit: 2017-10-31 | Discharge: 2017-10-31 | Disposition: A | Discharge status: Home / Self Care | Payer: Medicare Other | Source: Ambulatory Visit | Attending: Surgery | Admitting: Surgery

## 2017-10-31 ENCOUNTER — Other Ambulatory Visit: Payer: Self-pay

## 2017-10-31 DIAGNOSIS — I10 Essential (primary) hypertension: Secondary | ICD-10-CM | POA: Diagnosis not present

## 2017-10-31 DIAGNOSIS — E119 Type 2 diabetes mellitus without complications: Secondary | ICD-10-CM | POA: Diagnosis not present

## 2017-10-31 DIAGNOSIS — I2510 Atherosclerotic heart disease of native coronary artery without angina pectoris: Secondary | ICD-10-CM | POA: Insufficient documentation

## 2017-10-31 DIAGNOSIS — Z01812 Encounter for preprocedural laboratory examination: Secondary | ICD-10-CM | POA: Diagnosis not present

## 2017-10-31 DIAGNOSIS — J449 Chronic obstructive pulmonary disease, unspecified: Secondary | ICD-10-CM | POA: Insufficient documentation

## 2017-10-31 DIAGNOSIS — R1030 Lower abdominal pain, unspecified: Secondary | ICD-10-CM | POA: Insufficient documentation

## 2017-10-31 LAB — BASIC METABOLIC PANEL
ANION GAP: 7 (ref 5–15)
BUN: 21 mg/dL — ABNORMAL HIGH (ref 6–20)
CALCIUM: 9.2 mg/dL (ref 8.9–10.3)
CO2: 27 mmol/L (ref 22–32)
Chloride: 107 mmol/L (ref 101–111)
Creatinine, Ser: 1.44 mg/dL — ABNORMAL HIGH (ref 0.61–1.24)
GFR calc Af Amer: 55 mL/min — ABNORMAL LOW (ref >60)
GFR calc non Af Amer: 47 mL/min — ABNORMAL LOW (ref >60)
Glucose, Bld: 85 mg/dL (ref 65–99)
Potassium: 4.6 mmol/L (ref 3.5–5.1)
Sodium: 141 mmol/L (ref 135–145)

## 2017-10-31 LAB — CBC
HEMATOCRIT: 39.2 % (ref 39.0–52.0)
HEMOGLOBIN: 12.7 g/dL — AB (ref 13.0–17.0)
MCH: 31.4 pg (ref 26.0–34.0)
MCHC: 32.4 g/dL (ref 30.0–36.0)
MCV: 97 fL (ref 78.0–100.0)
Platelets: 177 10*3/uL (ref 150–400)
RBC: 4.04 MIL/uL — ABNORMAL LOW (ref 4.22–5.81)
RDW: 12.8 % (ref 11.5–15.5)
WBC: 7.3 10*3/uL (ref 4.0–10.5)

## 2017-10-31 LAB — GLUCOSE, CAPILLARY: GLUCOSE-CAPILLARY: 97 mg/dL (ref 65–99)

## 2017-10-31 NOTE — Progress Notes (Signed)
REQUESTED HGB A1C

## 2017-11-03 NOTE — Progress Notes (Addendum)
Anesthesia Chart Review:  Case:  [id number] Date/Time:  11/05/17 0930   Procedure:  RIGHT GROIN EXPLORATION, REMOVAL OF MESH (Right )   Anesthesia type:  General   Pre-op diagnosis:  CHRONIC RIGHT GROIN PAIN   Location:  [facility] OR ROOM 08 / [facility] OR   Surgeon:  Coralie Keens, MD      DISCUSSION: Patient is a 72 year old male scheduled for the above procedure. He has had chronic right groin pain since right inguinal hernia repair in 2017. He is s/p right groin exploration and ablation of right ileo-inguinal nerve 01/29/17.  History includes non-smoker, CAD/NSTEMI s/p CABG (LIMA-LAD, SVG-PDA-PLA, SVG-RAMUS, SVG-DAIG) 07/31/06, ischemic cardiomyopathy '08, AI (mild per '15 echo), HTN, AAA s/p EVAR 12/11/12 with type II endoleak, DM2, carotid artery stenosis (moderate '17), melanoma excision (back) '12, COPD.  Patient had recent cardiology follow-up, but was mildly fluid overloaded at that time. He had to take Lasix for a week. He reports fluid status stable currently. He is currently taking Lasix as needed. He denied SOB at rest and feels DOE is stable. (Reports he can walk through his house without dyspnea, but does have dyspnea with stairs or more strenous activities.) He denied chest pain. He reports last ASA and Plavix was on 10/29/17. He has had variable results in regards to his renal function--up to 1.51 in 2017 but ~ 1.0-1.25 in 2018. This year Cr has been ~ 1.0 -1.30. He has not had to take Lasix in the last few days and has not been outside in the heat. He denied any dysuria or known urinary retention. He has right groin pain, but denied back or abdominal pain. To his knowledge, he is not due to see vascular surgery again until ~ 04/2018.   I reviewed chart with anesthesiologist Dr. Suzette Battiest. Although renal function has been variable, will check an ISTAT8 on the day of surgery for stability since Cr ~ 1 to 1.4 in just a few days. We are also awaiting vascular surgery input given some increase in  size of patient's endoleak based on 04/2017 CTA. I have notified patient and [facility] triage nurse of plans to repeat labs and that we are waiting input from Dr. Trula Slade. If no vascular contraindication for patient to undergo general surgery and follow-up labs stable then I would anticipate that he can proceed as planned.   Chart will be left for follow-up vascular surgery input. (UPDATE 11/04/17 3:05 PM: Dr. Trula Slade does not have any concerns about patient proceeding with planned surgery from a vascular standpoint.)   VS: BP (!) 119/57   Pulse (!) 55   Temp 36.6 C   Resp 20   Ht 6' (1.829 m)   Wt 183 lb 8 oz (83.2 kg)   SpO2 100%   BMI 24.89 kg/m   PROVIDERS: Leonard Downing, MD is PCP ([place]). Nelva Bush, MD is Cardiologist, last visit 09/22/17. (Previously saw Dr. Aundra Dubin until he transitioned to the [facility].) Lasix X 1 week recommended due to mild volume overload. If symptoms not improved then would consider repeat echo. 3 month follow-up recommended. Continued medical therapy recommended for CAD.  Serafina Mitchell, MD is Vascular surgeon, last visit 04/21/17. One year follow-up anticipated, but pending review of CTA report. 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 Trula Slade a staff message inquiring if anything further needed from a vascular surgery standpoint prior to general surgery.) Carolan Clines, MD  is Dealer.    LABS: Preopeative labs reviewed. BUN 21, Cr 1.44 (previously 17/1.06 on 10/28/17 at PCP office, 31/1.30 on 09/29/17 and 13/1.02 on 01/30/17 in Epic). H/H 12.7/39.2. PLT 177. Glucose 85. A1c 6.3 on 10/28/17 at PCP office. (all labs ordered are listed, but only abnormal results are displayed)  Labs Reviewed  CBC - Abnormal; Notable for the following components:      Result Value   RBC 4.04 (*)    Hemoglobin 12.7 (*)    All other components within normal limits  BASIC METABOLIC PANEL -  Abnormal; Notable for the following components:   BUN 21 (*)    Creatinine, Ser 1.44 (*)    GFR calc non Af Amer 47 (*)    GFR calc Af Amer 55 (*)    All other components within normal limits  GLUCOSE, CAPILLARY    IMAGES: CTA abd/pelvis 04/21/17:  IMPRESSION: VASCULAR 1. Patent infrarenal bifurcated aortic stent graft with persistent type 2 endoleak. Some increase in native aneurysm sac diameter 5.6 x 5.1 cm, previously 5.1 x 4.6 on 02/11/2016. NON-VASCULAR 1. 1.9cm possible cyst or mass in the uncinate process of the pancreas. When the patient is clinically stable and able to follow directions and hold their breath (preferably as an outpatient) further evaluation with dedicated abdominal MRI should be considered.  OTHER:  Spirometry 09/06/14: FVC 2.81 58%,FEV1 2.03 55%,FEF 25-75% 1.56 48%. Moderately severe restriction.   EKG: 03/07/17: NSR   CV: Nuclear stress test 06/02/14: Overall Impression: Poor exercise capacity, but otherwise low risk stress nuclear study with borderline reduction in overall systolic function. Consider nonischemic cardiomyopathy. LV Ejection Fraction: 52%. LV Wall Motion: Mild global hypokinesis and borderline LVEF  Echo 01/31/14:  Study Conclusions - Left ventricle: The cavity size was normal. Systolic function was normal. The estimated ejection fraction was in the range of 55% to 60%. Wall motion was normal; there were no regional wall motion abnormalities. - Aortic valve: There was mild regurgitation. - Mitral valve: There was mild regurgitation. - Left atrium: The atrium was mildly dilated. - Right ventricle: The cavity size was mildly dilated. Wall thickness was normal. - Atrial septum: There was increased thickness of the septum, consistent with lipomatous hypertrophy. - Tricuspid valve: There was mild regurgitation.  By note, Holter in 08/2008 showed: Frequent PACs and blocked PACs. PVCs with trigeminy. No long pauses.  Slowest HR in 40s while asleep.   Last cardiac cath was 07/28/06 pre-CABG and showed significant three-vessel CAD, EF 25% with severe global hypokinesis, moderate pulmonary hypertension.   Carotid U/S 08/18/17: Final Interpretation: Right Carotid: Velocities in the right ICA are consistent with a 1-39% stenosis. Left Carotid: Velocities in the left ICA are consistent with a 1-39% stenosis. Non-hemodynamically significant plaque noted in the CCA. The ECAappears >50% stenosed. Vertebrals: Both vertebral arteries were patent with antegrade flow. Subclavians: Normal flow hemodynamics were seen in bilateral subclavian arteries. *See table(s) in full report for measurements and observations. Suggest follow up study in 12 months.   Past Medical History:  Diagnosis Date  . AAA (abdominal aortic aneurysm) ([place])    ultrasound done in 3/11 and showed AAA 3.9 x 3.9cm  . Altered taste   . Anemia    3 units of packed cells given post surgery for melanoma .  Marland Kitchen Anxiety   . Aortic insufficiency   . Arthritis    arm,   . CAD (coronary artery disease)    patient had non-ST-elevation MI in February 2008 and there was  an 80-90% proximal LAD stenosis, 90% proximal first diagonal stenosis, 99% ostial ramus stenosis. The first obtuse marginal was subtotally occluded and there was an 80% mid RCA stenosis. EF was estimated 25% on ventriculogram, coronary artery bypass grafting was done with a LIMA to the LAD, sequential saphenous vein graft to the PDA,  . Cancer ([place])    melanoma  . Carotid stenosis    Carotid US 3/17: bilat ICA 40-59% >> FU 1 year  . CHF (congestive heart failure) ([place]) 2008  . COPD (chronic obstructive pulmonary disease) ([place])   . Depression   . Diabetes mellitus   . DM2 (diabetes mellitus, type 2) ([place])   . History of PFTs    obstructive/restrictive. There is some reversibility with bronchodilator. The patient actually never smoked. He is on Spiriva  . HTN (hypertension)   . Hx of  prostate biopsy    being watched carefully by Dr. Hartley Barefoot, pt. can't have MRI due to stent- per pt.    . Irregular heart beat   . Melanoma ([place]) 09/2010   pT2a N0 M0; clark level IV.   Marland Kitchen Myocardial infarction ([place]) 2008  . Neuromuscular disorder ([place])    numbness of L arm, lack of coordination, pt.told that its related to shoulder, post 4/16 RCT surgery    . Pain    back  . Range of motion deficit 09/19/2011  . Shortness of breath    with exeration  . Torn rotator cuff    hx of. left     Past Surgical History:  Procedure Laterality Date  . ABDOMINAL AORTIC ANEURYSM REPAIR    . ABDOMINAL AORTIC ENDOVASCULAR STENT GRAFT N/A 12/11/2012   Procedure: ABDOMINAL AORTIC ENDOVASCULAR STENT GRAFT GORE;  Surgeon: Serafina Mitchell, MD;  Location: [facility];  Service: Vascular;  Laterality: N/A;  . ABLATION Right 01/29/2017   RIGHT GROIN EXPLORATION, ABLATION OF RIGHT ILEO-INGUINAL NERVE (Neurectomy)  . APPLICATION OF WOUND VAC    . ARTERIAL BYPASS SURGRY    . blood clots removed    . CORONARY ARTERY BYPASS GRAFT  07/2006  . GROIN DISSECTION Right 01/29/2017   Procedure: RIGHT GROIN EXPLORATION, ABLATION OF RIGHT ILEO-INGUINAL NERVE;  Surgeon: Coralie Keens, MD;  Location: [facility];  Service: General;  Laterality: Right;  TAP BLOCK  . INGUINAL HERNIA REPAIR Bilateral 04/18/2016   Procedure: LAPAROSCOPIC BILATERAL INGUINAL HERNIA REPAIR;  Surgeon: Coralie Keens, MD;  Location: [facility];  Service: General;  Laterality: Bilateral;  . INSERTION OF MESH Bilateral 04/18/2016   Procedure: INSERTION OF MESH;  Surgeon: Coralie Keens, MD;  Location: [facility];  Service: General;  Laterality: Bilateral;  . MELANOMA EXCISION N/A    mid.- eventually needed wound vac for healing   . SHOULDER ARTHROSCOPY WITH ROTATOR CUFF REPAIR AND SUBACROMIAL DECOMPRESSION Left 09/23/2014   Procedure: SHOULDER ARTHROSCOPY WITH ROTATOR CUFF REPAIR AND SUBACROMIAL DECOMPRESSION DISTAL CLAVICLE RESECTION;  Surgeon: Earlie Server, MD;   Location: [facility];  Service: Orthopedics;  Laterality: Left;  . TONSILLECTOMY  1952  . TONSILLECTOMY      MEDICATIONS: . aspirin 81 MG chewable tablet  . clopidogrel (PLAVIX) 75 MG tablet  . amLODipine (NORVASC) 10 MG tablet  . atorvastatin (LIPITOR) 40 MG tablet  . benazepril (LOTENSIN) 40 MG tablet  . carvedilol (COREG) 25 MG tablet  . cholecalciferol (VITAMIN D) 1000 UNITS tablet  . Coenzyme Q10 200 MG capsule  . docusate sodium (COLACE) 100 MG capsule  . furosemide (LASIX) 20 MG tablet  . glipiZIDE (GLUCOTROL) 10  MG tablet  . metFORMIN (GLUMETZA) 500 MG (MOD) 24 hr tablet  . nortriptyline (PAMELOR) 50 MG capsule  . Omega-3 Fatty Acids (FISH OIL TRIPLE STRENGTH) 1400 MG CAPS  . oxyCODONE-acetaminophen (PERCOCET/ROXICET) 5-325 MG tablet  . potassium chloride SA (K-DUR,KLOR-CON) 20 MEQ tablet  . sildenafil (VIAGRA) 100 MG tablet  . tiotropium (SPIRIVA) 18 MCG inhalation capsule  . traZODone (DESYREL) 100 MG tablet  . VOLTAREN 1 % GEL   No current facility-administered medications for this encounter.     ASA and Plavix last dose 10/29/17 per patient. He reported that he was instructed to hold ~ 1 week prior to his last surgery. (I notified [facility] triage nurse of patient's last ASA/Plavix doses.)  George [facility] Short Stay Center/Anesthesiology Phone [phone] 11/03/2017 3:50 PM

## 2017-11-04 NOTE — H&P (Signed)
Mitchell [name] is an 72 y.o. male.   Chief Complaint: chronic right groin pain HPI: this patient continues to have chronic right groin discomfort.  His surgical history includes an abdominal aortic stent graft, bilateral left Inguinal hernia repair with mesh, and right groin exploration with ablation of the right ilioinguinal nerve.  Despite this, he has continued to have intermittent sharp groin discomfort.  He has had multiple injections in the groin in our office which only relieve his symptoms intermittently.  Again, he does not hurt every day.  MRI of the pelvis in 2018 as well as CT scan were unremarkable.  After long discussion, he wants to proceed to the operating room to remove the mesh in the right inguinal area.  Past Medical History:  Diagnosis Date  . AAA (abdominal aortic aneurysm) ([place])    ultrasound done in 3/11 and showed AAA 3.9 x 3.9cm  . Altered taste   . Anemia    3 units of packed cells given post surgery for melanoma .  Marland Kitchen Anxiety   . Aortic insufficiency   . Arthritis    arm,   . CAD (coronary artery disease)    patient had non-ST-elevation MI in February 2008 and there was an 80-90% proximal LAD stenosis, 90% proximal first diagonal stenosis, 99% ostial ramus stenosis. The first obtuse marginal was subtotally occluded and there was an 80% mid RCA stenosis. EF was estimated 25% on ventriculogram, coronary artery bypass grafting was done with a LIMA to the LAD, sequential saphenous vein graft to the PDA,  . Cancer ([place])    melanoma  . Carotid stenosis    Carotid US 3/17: bilat ICA 40-59% >> FU 1 year  . CHF (congestive heart failure) ([place]) 2008  . COPD (chronic obstructive pulmonary disease) ([place])   . Depression   . Diabetes mellitus   . DM2 (diabetes mellitus, type 2) ([place])   . History of PFTs    obstructive/restrictive. There is some reversibility with bronchodilator. The patient actually never smoked. He is on Spiriva  . HTN (hypertension)   . Hx of prostate  biopsy    being watched carefully by Dr. Hartley Barefoot, pt. can't have MRI due to stent- per pt.    . Irregular heart beat   . Melanoma ([place]) 09/2010   pT2a N0 M0; clark level IV.   Marland Kitchen Myocardial infarction ([place]) 2008  . Neuromuscular disorder ([place])    numbness of L arm, lack of coordination, pt.told that its related to shoulder, post 4/16 RCT surgery    . Pain    back  . Range of motion deficit 09/19/2011  . Shortness of breath    with exeration  . Torn rotator cuff    hx of. left     Past Surgical History:  Procedure Laterality Date  . ABDOMINAL AORTIC ANEURYSM REPAIR    . ABDOMINAL AORTIC ENDOVASCULAR STENT GRAFT N/A 12/11/2012   Procedure: ABDOMINAL AORTIC ENDOVASCULAR STENT GRAFT GORE;  Surgeon: Serafina Mitchell, MD;  Location: [facility];  Service: Vascular;  Laterality: N/A;  . ABLATION Right 01/29/2017   RIGHT GROIN EXPLORATION, ABLATION OF RIGHT ILEO-INGUINAL NERVE (Neurectomy)  . APPLICATION OF WOUND VAC    . ARTERIAL BYPASS SURGRY    . blood clots removed    . CORONARY ARTERY BYPASS GRAFT  07/2006  . GROIN DISSECTION Right 01/29/2017   Procedure: RIGHT GROIN EXPLORATION, ABLATION OF RIGHT ILEO-INGUINAL NERVE;  Surgeon: Coralie Keens, MD;  Location: [facility];  Service: General;  Laterality:  Right;  TAP BLOCK  . INGUINAL HERNIA REPAIR Bilateral 04/18/2016   Procedure: LAPAROSCOPIC BILATERAL INGUINAL HERNIA REPAIR;  Surgeon: Coralie Keens, MD;  Location: [facility];  Service: General;  Laterality: Bilateral;  . INSERTION OF MESH Bilateral 04/18/2016   Procedure: INSERTION OF MESH;  Surgeon: Coralie Keens, MD;  Location: [facility];  Service: General;  Laterality: Bilateral;  . MELANOMA EXCISION N/A    mid.- eventually needed wound vac for healing   . SHOULDER ARTHROSCOPY WITH ROTATOR CUFF REPAIR AND SUBACROMIAL DECOMPRESSION Left 09/23/2014   Procedure: SHOULDER ARTHROSCOPY WITH ROTATOR CUFF REPAIR AND SUBACROMIAL DECOMPRESSION DISTAL CLAVICLE RESECTION;  Surgeon: Earlie Server, MD;  Location:  [facility];  Service: Orthopedics;  Laterality: Left;  . TONSILLECTOMY  1951  . TONSILLECTOMY      Family History  Problem Relation Age of Onset  . Hyperlipidemia Mother   . Hypertension Mother   . Heart disease Mother   . AAA (abdominal aortic aneurysm) Mother   . Hypertension Father   . AAA (abdominal aortic aneurysm) Father   . Heart disease Father   . Hyperlipidemia Father   . Throat cancer Maternal Uncle    Social History:  reports that he has never smoked. He has never used smokeless tobacco. He reports that he does not drink alcohol or use drugs.  Allergies:  Allergies  Allergen Reactions  . Gabapentin Other (See Comments)    Tremors  . Zocor [Simvastatin] Other (See Comments)    Bone Pain  . Lyrica [Pregabalin] Other (See Comments)    Dizziness   . Nsaids Other (See Comments)    Aneurism    . Tape Rash and Other (See Comments)    REDNESS    No medications prior to admission.    No results found for this or any previous visit (from the past 48 hour(s)). No results found.  Review of Systems  All other systems reviewed and are negative.   There were no vitals taken for this visit. Physical Exam  Constitutional: He is oriented to person, place, and time. He appears well-nourished.  HENT:  Head: Normocephalic and atraumatic.  Eyes: Pupils are equal, round, and reactive to light. Right eye exhibits no discharge.  Neck: Normal range of motion. No tracheal deviation present.  Cardiovascular: Normal rate and regular rhythm.  Respiratory: Breath sounds normal. No respiratory distress.  GI: Soft. There is no tenderness.  There is a well-healed right inguinal incision without evidence of hernia  Musculoskeletal: Normal range of motion. He exhibits no edema.  Neurological: He is alert and oriented to person, place, and time.  Skin: Skin is warm and dry. He is not diaphoretic. No erythema.  Psychiatric: His behavior is normal. Judgment normal.      Assessment/Plan Chronic right groin pain of uncertain etiology  At this point, he has been on narcotics, has been on multiple neurologic agents, and has had multiple injections in the groin as well as multiple surgeries with no relief of his intermittent pain.  He would like to proceed to the operating room for removal of the previously placed laparoscopic right inguinal mesh.  We have been discussing this in detail for some time.  He now proceed with surgery.  We discussed the risk of this in detail.  He understands these risks may include but are not limited to bleeding, infection, need for complete removal of the testicular cord and testicle on the right side, injury to surrounding structures, hernia recurrence, continued chronic pain despite surgery, postoperative recovery,  cardiopulmonary issues, DVT, etc.  At this point, surgery will be scheduled  Amira Podolak A, MD 11/04/2017, 4:00 PM

## 2017-11-04 NOTE — Anesthesia Preprocedure Evaluation (Addendum)
Anesthesia Evaluation  Patient identified by MRN, date of birth, ID band Patient awake    Reviewed: Allergy & Precautions, NPO status , Patient's Chart, lab work & pertinent test results  Airway Mallampati: II  TM Distance: >3 FB Neck ROM: Full    Dental  (+) Lower Dentures   Pulmonary shortness of breath, COPD,  Spirometry 09/06/14: FVC 2.81 58%, FEV1 2.03 55%, FEF 25-75% 1.56 48%. Moderately severe restriction.   Pulmonary exam normal breath sounds clear to auscultation       Cardiovascular hypertension, + CAD, + Past MI and + Peripheral Vascular Disease  Normal cardiovascular exam Rhythm:Regular Rate:Normal  Overall Impression: Poor exercise capacity, but otherwise low risk stress nuclear study with borderline reduction in overall systolic function. Consider nonischemic cardiomyopathy. LV Ejection Fraction: 52%. LV Wall Motion: Mild global hypokinesis and borderline LVEF     Neuro/Psych  Neuromuscular disease negative neurological ROS     GI/Hepatic negative GI ROS, Neg liver ROS,   Endo/Other  diabetes  Renal/GU      Musculoskeletal negative musculoskeletal ROS (+)   Abdominal   Peds  Hematology  (+) anemia ,   Anesthesia Other Findings All: Gabapentin, lyrica, NSAIDS  Reproductive/Obstetrics                            Lab Results  Component Value Date   CREATININE 1.44 (H) 10/31/2017   BUN 21 (H) 10/31/2017   NA 141 10/31/2017   K 4.6 10/31/2017   CL 107 10/31/2017   CO2 27 10/31/2017    Lab Results  Component Value Date   WBC 7.3 10/31/2017   HGB 12.7 (L) 10/31/2017   HCT 39.2 10/31/2017   MCV 97.0 10/31/2017   PLT 177 10/31/2017    Anesthesia Physical Anesthesia Plan  ASA: IV  Anesthesia Plan: General   Post-op Pain Management:    Induction: Intravenous  PONV Risk Score and Plan: Treatment may vary due to age or medical condition  Airway Management  Planned: LMA  Additional Equipment:   Intra-op Plan:   Post-operative Plan:   Informed Consent: I have reviewed the patients History and Physical, chart, labs and discussed the procedure including the risks, benefits and alternatives for the proposed anesthesia with the patient or authorized representative who has indicated his/her understanding and acceptance.     Plan Discussed with: CRNA and Anesthesiologist  Anesthesia Plan Comments:         Anesthesia Quick Evaluation

## 2017-11-05 ENCOUNTER — Other Ambulatory Visit: Payer: Self-pay

## 2017-11-05 ENCOUNTER — Ambulatory Visit (HOSPITAL_COMMUNITY): Payer: Medicare Other | Admitting: Emergency Medicine

## 2017-11-05 ENCOUNTER — Encounter (HOSPITAL_COMMUNITY)
Admission: RE | Disposition: A | Discharge status: Home / Self Care | Payer: Self-pay | Source: Ambulatory Visit | Attending: Surgery

## 2017-11-05 ENCOUNTER — Ambulatory Visit (HOSPITAL_COMMUNITY): Payer: Medicare Other | Admitting: Vascular Surgery

## 2017-11-05 ENCOUNTER — Observation Stay (HOSPITAL_COMMUNITY)
Admission: RE | Admit: 2017-11-05 | Discharge: 2017-11-06 | Disposition: A | Discharge status: Home / Self Care | Payer: Medicare Other | Source: Ambulatory Visit | Attending: Surgery | Admitting: Surgery

## 2017-11-05 ENCOUNTER — Encounter (HOSPITAL_COMMUNITY): Payer: Self-pay | Admitting: *Deleted

## 2017-11-05 DIAGNOSIS — I252 Old myocardial infarction: Secondary | ICD-10-CM | POA: Diagnosis not present

## 2017-11-05 DIAGNOSIS — Z888 Allergy status to other drugs, medicaments and biological substances status: Secondary | ICD-10-CM | POA: Insufficient documentation

## 2017-11-05 DIAGNOSIS — G8929 Other chronic pain: Secondary | ICD-10-CM | POA: Diagnosis present

## 2017-11-05 DIAGNOSIS — Z7984 Long term (current) use of oral hypoglycemic drugs: Secondary | ICD-10-CM | POA: Diagnosis not present

## 2017-11-05 DIAGNOSIS — Z791 Long term (current) use of non-steroidal anti-inflammatories (NSAID): Secondary | ICD-10-CM | POA: Diagnosis not present

## 2017-11-05 DIAGNOSIS — R1031 Right lower quadrant pain: Principal | ICD-10-CM | POA: Insufficient documentation

## 2017-11-05 DIAGNOSIS — Z8249 Family history of ischemic heart disease and other diseases of the circulatory system: Secondary | ICD-10-CM | POA: Diagnosis not present

## 2017-11-05 DIAGNOSIS — I110 Hypertensive heart disease with heart failure: Secondary | ICD-10-CM | POA: Insufficient documentation

## 2017-11-05 DIAGNOSIS — E119 Type 2 diabetes mellitus without complications: Secondary | ICD-10-CM | POA: Insufficient documentation

## 2017-11-05 DIAGNOSIS — Z7982 Long term (current) use of aspirin: Secondary | ICD-10-CM | POA: Diagnosis not present

## 2017-11-05 DIAGNOSIS — F329 Major depressive disorder, single episode, unspecified: Secondary | ICD-10-CM | POA: Diagnosis not present

## 2017-11-05 DIAGNOSIS — Z951 Presence of aortocoronary bypass graft: Secondary | ICD-10-CM | POA: Insufficient documentation

## 2017-11-05 DIAGNOSIS — G709 Myoneural disorder, unspecified: Secondary | ICD-10-CM | POA: Diagnosis not present

## 2017-11-05 DIAGNOSIS — Z85820 Personal history of malignant melanoma of skin: Secondary | ICD-10-CM | POA: Insufficient documentation

## 2017-11-05 DIAGNOSIS — Z79899 Other long term (current) drug therapy: Secondary | ICD-10-CM | POA: Insufficient documentation

## 2017-11-05 DIAGNOSIS — Z886 Allergy status to analgesic agent status: Secondary | ICD-10-CM | POA: Diagnosis not present

## 2017-11-05 DIAGNOSIS — Z79891 Long term (current) use of opiate analgesic: Secondary | ICD-10-CM | POA: Insufficient documentation

## 2017-11-05 DIAGNOSIS — Z7902 Long term (current) use of antithrombotics/antiplatelets: Secondary | ICD-10-CM | POA: Insufficient documentation

## 2017-11-05 DIAGNOSIS — I2510 Atherosclerotic heart disease of native coronary artery without angina pectoris: Secondary | ICD-10-CM | POA: Diagnosis not present

## 2017-11-05 DIAGNOSIS — J449 Chronic obstructive pulmonary disease, unspecified: Secondary | ICD-10-CM | POA: Diagnosis not present

## 2017-11-05 HISTORY — PX: GROIN EXPLORATION: SHX1713

## 2017-11-05 HISTORY — PX: EXCISION OF MESH: SHX6268

## 2017-11-05 LAB — GLUCOSE, CAPILLARY
GLUCOSE-CAPILLARY: 170 mg/dL — AB (ref 65–99)
GLUCOSE-CAPILLARY: 115 mg/dL — AB (ref 65–99)
Glucose-Capillary: 136 mg/dL — ABNORMAL HIGH (ref 65–99)

## 2017-11-05 LAB — POCT I-STAT, CHEM 8
BUN: 16 mg/dL (ref 6–20)
CREATININE: 1.1 mg/dL (ref 0.61–1.24)
Calcium, Ion: 1.15 mmol/L (ref 1.15–1.40)
Chloride: 103 mmol/L (ref 101–111)
Glucose, Bld: 152 mg/dL — ABNORMAL HIGH (ref 65–99)
HEMATOCRIT: 37 % — AB (ref 39.0–52.0)
HEMOGLOBIN: 12.6 g/dL — AB (ref 13.0–17.0)
Potassium: 4.2 mmol/L (ref 3.5–5.1)
Sodium: 141 mmol/L (ref 135–145)
TCO2: 26 mmol/L (ref 22–32)

## 2017-11-05 SURGERY — REMOVAL, MESH, ABDOMEN OR PELVIS
Anesthesia: General | Site: Groin | Laterality: Right

## 2017-11-05 MED ORDER — DIPHENHYDRAMINE HCL 50 MG/ML IJ SOLN: 12.5000 mg | Freq: Four times a day (QID) | INTRAMUSCULAR | Status: DC | PRN

## 2017-11-05 MED ORDER — FENTANYL CITRATE (PF) 100 MCG/2ML IJ SOLN
INTRAMUSCULAR | Status: DC | PRN
  Administered 2017-11-05: 25 ug via INTRAVENOUS
  Administered 2017-11-05: 50 ug via INTRAVENOUS

## 2017-11-05 MED ORDER — DIPHENHYDRAMINE HCL 12.5 MG/5ML PO ELIX: 12.5000 mg | ORAL_SOLUTION | Freq: Four times a day (QID) | ORAL | Status: DC | PRN

## 2017-11-05 MED ORDER — MEPERIDINE HCL 50 MG/ML IJ SOLN: 6.2500 mg | INTRAMUSCULAR | Status: DC | PRN

## 2017-11-05 MED ORDER — HYDROMORPHONE HCL 2 MG/ML IJ SOLN
INTRAMUSCULAR | Status: AC
  Filled 2017-11-05: qty 1

## 2017-11-05 MED ORDER — SODIUM CHLORIDE 0.9 % IJ SOLN
INTRAMUSCULAR | Status: AC
  Filled 2017-11-05: qty 10

## 2017-11-05 MED ORDER — METFORMIN HCL ER 500 MG PO TB24
500.0000 mg | ORAL_TABLET | Freq: Every day | ORAL | Status: DC
  Administered 2017-11-05: 500 mg via ORAL
  Filled 2017-11-05: qty 1

## 2017-11-05 MED ORDER — BUPIVACAINE HCL (PF) 0.25 % IJ SOLN
INTRAMUSCULAR | Status: AC
  Filled 2017-11-05: qty 30

## 2017-11-05 MED ORDER — TRAZODONE HCL 100 MG PO TABS
100.0000 mg | ORAL_TABLET | Freq: Every day | ORAL | Status: DC
  Administered 2017-11-05: 100 mg via ORAL
  Filled 2017-11-05: qty 1

## 2017-11-05 MED ORDER — CHLORHEXIDINE GLUCONATE CLOTH 2 % EX PADS: 6.0000 | MEDICATED_PAD | Freq: Once | CUTANEOUS | Status: DC

## 2017-11-05 MED ORDER — CARVEDILOL 25 MG PO TABS
25.0000 mg | ORAL_TABLET | Freq: Two times a day (BID) | ORAL | Status: DC
  Administered 2017-11-05 – 2017-11-06 (×2): 25 mg via ORAL
  Filled 2017-11-05 (×2): qty 1

## 2017-11-05 MED ORDER — ONDANSETRON HCL 4 MG/2ML IJ SOLN: 4.0000 mg | Freq: Four times a day (QID) | INTRAMUSCULAR | Status: DC | PRN

## 2017-11-05 MED ORDER — OXYCODONE HCL 5 MG PO TABS
5.0000 mg | ORAL_TABLET | ORAL | Status: DC | PRN
  Administered 2017-11-05 – 2017-11-06 (×2): 5 mg via ORAL
  Filled 2017-11-05 (×2): qty 1

## 2017-11-05 MED ORDER — PROMETHAZINE HCL 25 MG/ML IJ SOLN: 6.2500 mg | INTRAMUSCULAR | Status: DC | PRN

## 2017-11-05 MED ORDER — LIDOCAINE 2% (20 MG/ML) 5 ML SYRINGE
INTRAMUSCULAR | Status: DC | PRN
  Administered 2017-11-05: 100 mg via INTRAVENOUS

## 2017-11-05 MED ORDER — LACTATED RINGERS IV SOLN
INTRAVENOUS | Status: DC
  Administered 2017-11-05: 09:00:00 via INTRAVENOUS

## 2017-11-05 MED ORDER — PROPOFOL 10 MG/ML IV BOLUS
INTRAVENOUS | Status: AC
  Filled 2017-11-05: qty 20

## 2017-11-05 MED ORDER — ACETAMINOPHEN 10 MG/ML IV SOLN: 1000.0000 mg | Freq: Once | INTRAVENOUS | Status: DC | PRN

## 2017-11-05 MED ORDER — PROPOFOL 10 MG/ML IV BOLUS
INTRAVENOUS | Status: DC | PRN
  Administered 2017-11-05: 100 mg via INTRAVENOUS
  Administered 2017-11-05: 20 mg via INTRAVENOUS

## 2017-11-05 MED ORDER — NORTRIPTYLINE HCL 25 MG PO CAPS
50.0000 mg | ORAL_CAPSULE | Freq: Every day | ORAL | Status: DC
  Administered 2017-11-05: 50 mg via ORAL
  Filled 2017-11-05: qty 2

## 2017-11-05 MED ORDER — CEFAZOLIN SODIUM-DEXTROSE 2-4 GM/100ML-% IV SOLN
2.0000 g | INTRAVENOUS | Status: AC
  Administered 2017-11-05: 2 g via INTRAVENOUS
  Filled 2017-11-05: qty 100

## 2017-11-05 MED ORDER — EPHEDRINE SULFATE 50 MG/ML IJ SOLN
INTRAMUSCULAR | Status: AC
  Filled 2017-11-05: qty 1

## 2017-11-05 MED ORDER — ENOXAPARIN SODIUM 40 MG/0.4ML ~~LOC~~ SOLN
40.0000 mg | SUBCUTANEOUS | Status: DC
  Administered 2017-11-06: 40 mg via SUBCUTANEOUS
  Filled 2017-11-05: qty 0.4

## 2017-11-05 MED ORDER — EPHEDRINE SULFATE-NACL 50-0.9 MG/10ML-% IV SOSY
PREFILLED_SYRINGE | INTRAVENOUS | Status: DC | PRN
  Administered 2017-11-05 (×2): 5 mg via INTRAVENOUS
  Administered 2017-11-05: 10 mg via INTRAVENOUS

## 2017-11-05 MED ORDER — AMLODIPINE BESYLATE 10 MG PO TABS
10.0000 mg | ORAL_TABLET | Freq: Every day | ORAL | Status: DC
  Administered 2017-11-06: 10 mg via ORAL
  Filled 2017-11-05: qty 1

## 2017-11-05 MED ORDER — ONDANSETRON HCL 4 MG/2ML IJ SOLN
INTRAMUSCULAR | Status: DC | PRN
  Administered 2017-11-05: 4 mg via INTRAVENOUS

## 2017-11-05 MED ORDER — GLIPIZIDE 5 MG PO TABS
5.0000 mg | ORAL_TABLET | Freq: Two times a day (BID) | ORAL | Status: DC
  Administered 2017-11-05 – 2017-11-06 (×2): 5 mg via ORAL
  Filled 2017-11-05 (×2): qty 1

## 2017-11-05 MED ORDER — PHENYLEPHRINE 40 MCG/ML (10ML) SYRINGE FOR IV PUSH (FOR BLOOD PRESSURE SUPPORT)
PREFILLED_SYRINGE | INTRAVENOUS | Status: AC
  Filled 2017-11-05: qty 10

## 2017-11-05 MED ORDER — BUPIVACAINE HCL (PF) 0.25 % IJ SOLN
INTRAMUSCULAR | Status: DC | PRN
  Administered 2017-11-05: 20 mL

## 2017-11-05 MED ORDER — BUPIVACAINE LIPOSOME 1.3 % IJ SUSP
INTRAMUSCULAR | Status: DC | PRN
  Administered 2017-11-05: 20 mL

## 2017-11-05 MED ORDER — ONDANSETRON HCL 4 MG/2ML IJ SOLN
INTRAMUSCULAR | Status: AC
  Filled 2017-11-05: qty 2

## 2017-11-05 MED ORDER — FENTANYL CITRATE (PF) 250 MCG/5ML IJ SOLN
INTRAMUSCULAR | Status: AC
  Filled 2017-11-05: qty 5

## 2017-11-05 MED ORDER — BUPIVACAINE LIPOSOME 1.3 % IJ SUSP
20.0000 mL | INTRAMUSCULAR | Status: DC
  Filled 2017-11-05: qty 20

## 2017-11-05 MED ORDER — POTASSIUM CHLORIDE IN NACL 20-0.9 MEQ/L-% IV SOLN
INTRAVENOUS | Status: DC
  Administered 2017-11-05: 17:00:00 via INTRAVENOUS
  Filled 2017-11-05 (×2): qty 1000

## 2017-11-05 MED ORDER — TIOTROPIUM BROMIDE MONOHYDRATE 18 MCG IN CAPS
18.0000 ug | ORAL_CAPSULE | Freq: Every day | RESPIRATORY_TRACT | Status: DC
  Administered 2017-11-06: 18 ug via RESPIRATORY_TRACT
  Filled 2017-11-05: qty 5

## 2017-11-05 MED ORDER — MORPHINE SULFATE (PF) 2 MG/ML IV SOLN: 1.0000 mg | INTRAVENOUS | Status: DC | PRN

## 2017-11-05 MED ORDER — BENAZEPRIL HCL 40 MG PO TABS
40.0000 mg | ORAL_TABLET | Freq: Every day | ORAL | Status: DC
  Administered 2017-11-05 – 2017-11-06 (×2): 40 mg via ORAL
  Filled 2017-11-05 (×3): qty 1

## 2017-11-05 MED ORDER — HYDROMORPHONE HCL 2 MG/ML IJ SOLN
0.2500 mg | INTRAMUSCULAR | Status: DC | PRN
  Administered 2017-11-05 (×2): 0.5 mg via INTRAVENOUS

## 2017-11-05 MED ORDER — PHENYLEPHRINE 40 MCG/ML (10ML) SYRINGE FOR IV PUSH (FOR BLOOD PRESSURE SUPPORT)
PREFILLED_SYRINGE | INTRAVENOUS | Status: DC | PRN
  Administered 2017-11-05: 40 ug via INTRAVENOUS

## 2017-11-05 MED ORDER — ONDANSETRON 4 MG PO TBDP: 4.0000 mg | ORAL_TABLET | Freq: Four times a day (QID) | ORAL | Status: DC | PRN

## 2017-11-05 MED ORDER — HYDROCODONE-ACETAMINOPHEN 7.5-325 MG PO TABS: 1.0000 | ORAL_TABLET | Freq: Once | ORAL | Status: DC | PRN

## 2017-11-05 SURGICAL SUPPLY — 38 items
ADH SKN CLS APL DERMABOND .7 (GAUZE/BANDAGES/DRESSINGS) ×1
BLADE CLIPPER SURG (BLADE) IMPLANT
CANISTER SUCT 3000ML PPV (MISCELLANEOUS) ×3 IMPLANT
CHLORAPREP W/TINT 26ML (MISCELLANEOUS) ×3 IMPLANT
COVER SURGICAL LIGHT HANDLE (MISCELLANEOUS) ×3 IMPLANT
DERMABOND ADVANCED (GAUZE/BANDAGES/DRESSINGS) ×2
DERMABOND ADVANCED .7 DNX12 (GAUZE/BANDAGES/DRESSINGS) IMPLANT
DRAPE LAPAROSCOPIC ABDOMINAL (DRAPES) ×3 IMPLANT
DRAPE UTILITY XL STRL (DRAPES) ×6 IMPLANT
ELECT CAUTERY BLADE 6.4 (BLADE) ×3 IMPLANT
ELECT REM PT RETURN 9FT ADLT (ELECTROSURGICAL) ×3
ELECTRODE REM PT RTRN 9FT ADLT (ELECTROSURGICAL) ×1 IMPLANT
GAUZE SPONGE 4X4 12PLY STRL (GAUZE/BANDAGES/DRESSINGS) IMPLANT
GLOVE SURG SIGNA 7.5 PF LTX (GLOVE) ×3 IMPLANT
GOWN STRL REUS W/ TWL LRG LVL3 (GOWN DISPOSABLE) ×2 IMPLANT
GOWN STRL REUS W/ TWL XL LVL3 (GOWN DISPOSABLE) ×1 IMPLANT
GOWN STRL REUS W/TWL LRG LVL3 (GOWN DISPOSABLE) ×6
GOWN STRL REUS W/TWL XL LVL3 (GOWN DISPOSABLE) ×3
KIT BASIN OR (CUSTOM PROCEDURE TRAY) ×3 IMPLANT
KIT TURNOVER KIT B (KITS) ×3 IMPLANT
NDL HYPO 25GX1X1/2 BEV (NEEDLE) IMPLANT
NEEDLE HYPO 25GX1X1/2 BEV (NEEDLE) ×3 IMPLANT
NS IRRIG 1000ML POUR BTL (IV SOLUTION) ×3 IMPLANT
PACK GENERAL/GYN (CUSTOM PROCEDURE TRAY) ×3 IMPLANT
PAD ARMBOARD 7.5X6 YLW CONV (MISCELLANEOUS) ×3 IMPLANT
STAPLER VISISTAT 35W (STAPLE) IMPLANT
SUT MNCRL AB 4-0 PS2 18 (SUTURE) ×3 IMPLANT
SUT NOVA NAB DX-16 0-1 5-0 T12 (SUTURE) IMPLANT
SUT PROLENE 1 CT (SUTURE) ×2 IMPLANT
SUT SILK 2 0 SH CR/8 (SUTURE) ×2 IMPLANT
SUT SILK 3 0 SH CR/8 (SUTURE) ×2 IMPLANT
SUT VIC AB 2-0 CT1 27 (SUTURE) ×3
SUT VIC AB 2-0 CT1 TAPERPNT 27 (SUTURE) IMPLANT
SUT VIC AB 3-0 SH 27 (SUTURE) ×3
SUT VIC AB 3-0 SH 27XBRD (SUTURE) ×1 IMPLANT
SYR CONTROL 10ML LL (SYRINGE) IMPLANT
TOWEL OR 17X24 6PK STRL BLUE (TOWEL DISPOSABLE) ×3 IMPLANT
TOWEL OR 17X26 10 PK STRL BLUE (TOWEL DISPOSABLE) ×3 IMPLANT

## 2017-11-05 NOTE — Interval H&P Note (Signed)
History and Physical Interval Note: no change in H and P  11/05/2017 9:01 AM  Mitchell [name]  has presented today for surgery, with the diagnosis of CHRONIC RIGHT GROIN PAIN  The various methods of treatment have been discussed with the patient and family. After consideration of risks, benefits and other options for treatment, the patient has consented to  Procedure(s): RIGHT GROIN EXPLORATION, REMOVAL OF MESH (Right) as a surgical intervention .  The patient's history has been reviewed, patient examined, no change in status, stable for surgery.  I have reviewed the patient's chart and labs.  Questions were answered to the patient's satisfaction.     Navjot Pilgrim A

## 2017-11-05 NOTE — Anesthesia Postprocedure Evaluation (Signed)
Anesthesia Post Note  Patient: Mitchell [name]  Procedure(s) Performed: RIGHT GROIN EXPLORATION, REMOVAL OF MESH (Right Groin)     Patient location during evaluation: PACU Anesthesia Type: General Level of consciousness: awake and alert Pain management: pain level controlled Vital Signs Assessment: post-procedure vital signs reviewed and stable Respiratory status: spontaneous breathing, nonlabored ventilation, respiratory function stable and patient connected to nasal cannula oxygen Cardiovascular status: blood pressure returned to baseline and stable Postop Assessment: no apparent nausea or vomiting Anesthetic complications: no    Last Vitals:  Vitals:   11/05/17 1230 11/05/17 1300  BP: (!) 124/57 124/65  Pulse: 60 (!) 57  Resp: 10 10  Temp:    SpO2: 100% 100%    Last Pain:  Vitals:   11/05/17 1230  TempSrc:   PainSc: [place] A Tyshauna Finkbiner

## 2017-11-05 NOTE — Op Note (Signed)
RIGHT GROIN EXPLORATION, REMOVAL OF MESH  Procedure Note  Mitchell [name] 11/05/2017   Pre-op Diagnosis: CHRONIC RIGHT GROIN PAIN     Post-op Diagnosis: same  Procedure(s): RIGHT GROIN EXPLORATION, REMOVAL OF MESH  Surgeon(s): Coralie Keens, MD  Anesthesia: General  Staff:  Circulator: Phillips Grout, RN Scrub Person: Rosanne Sack, RN  Estimated Blood Loss: Minimal               Indications: This is a 72 year old gentleman whose had a previous bilateral laparoscopic inguinal hernia repair with mesh in November 2017.  He started having chronic right groin discomfort at some point after this.  He may have had some pain in the groin preoperatively as well.  An MRI was unremarkable.  Because of persistent discomfort despite ilioinguinal nerve blocks, he underwent a groin exploration in August 2018 with ablation of the right ilioinguinal nerve.  He was pain-free for several months and then the pain is returned.  He is now requesting removal of the mesh as he still has sharp, intermittent pain in the right groin despite oral pain medications and injections.  He has been unable to tolerate Neurontin or Lyrica.  Procedure: The patient was brought to the operating room and identified as the correct patient.  He was placed supine on the operating room table and general anesthesia was induced.  His right groin was then prepped and draped in the usual sterile fashion.  I anesthetized the skin at the previous incision with Experal and Marcaine.  I made an incision to the previous scar with electrocautery.  I carried this down through the subtenons tissue and Scarpa's fascia with electrocautery.  There was extensive amount of scarring.  I could finally identify the external oblique fascia which I opened toward the internal and external rings.  There is some scarring and was difficult to control the cord structures.  I was able to identify the previously placed Prolene mesh.  This is been  placed with absorbable tacks so there were no titanium tacks.  The mesh was then excised piecemeal with a scalpel.  This was quite difficult as it was so scarred in.  I had to sacrifice the epigastric artery which was intimately involved with the mesh and tied off with silk sutures.  I was then able to remove all the palpable mesh going around the internal ring as well.  I further explored the room and found no other abnormalities.  At this point the testicular cord appeared intact although scarred.  I then reapproximated the deep tissue with peritoneum and muscle in a running fashion with 2-0 Vicryl sutures.  I then closed the fascia with a running 2-0 Prolene suture.  I anesthetized the layers with Exparel.  I also injected lateral to the incision with Exparel as well.  Hemostasis appeared to be achieved.  I then closed Scarpa's fascia with interrupted 3-0 Vicryl sutures and closed the skin with a running 4-0 Monocryl.  Dermabond was then applied.  The patient tolerated the procedure well.  All the counts were correct at the end of the procedure.  The patient was then extubated in the operating room and taken in a stable condition to the recovery room.          Mitchell [name] A   Date: 11/05/2017  Time: 10:39 AM

## 2017-11-05 NOTE — Anesthesia Procedure Notes (Signed)
Procedure Name: LMA Insertion Date/Time: 11/05/2017 9:29 AM Performed by: Colin Benton, CRNA Pre-anesthesia Checklist: Patient identified, Emergency Drugs available, Suction available and Patient being monitored Patient Re-evaluated:Patient Re-evaluated prior to induction Oxygen Delivery Method: Circle system utilized Preoxygenation: Pre-oxygenation with 100% oxygen Induction Type: IV induction Ventilation: Mask ventilation without difficulty LMA: LMA inserted LMA Size: 5.0 Number of attempts: 1 Placement Confirmation: positive ETCO2 and breath sounds checked- equal and bilateral Tube secured with: Tape Dental Injury: Teeth and Oropharynx as per pre-operative assessment

## 2017-11-05 NOTE — Transfer of Care (Signed)
Immediate Anesthesia Transfer of Care Note  Patient: Mitchell [name]  Procedure(s) Performed: RIGHT GROIN EXPLORATION, REMOVAL OF MESH (Right Groin)  Patient Location: PACU  Anesthesia Type:General  Level of Consciousness: drowsy and patient cooperative  Airway & Oxygen Therapy: Patient Spontanous Breathing and Patient connected to nasal cannula oxygen  Post-op Assessment: Report given to RN and Post -op Vital signs reviewed and stable  Post vital signs: Reviewed and stable  Last Vitals:  Vitals Value Taken Time  BP 134/67 11/05/2017 10:45 AM  Temp    Pulse 66 11/05/2017 10:47 AM  Resp 18 11/05/2017 10:47 AM  SpO2 100 % 11/05/2017 10:47 AM  Vitals shown include unvalidated device data.  Last Pain:  Vitals:   11/05/17 0822  TempSrc:   PainSc: 4       Patients Stated Pain Goal: 3 (25/[id number]/63 [id number])  Complications: No apparent anesthesia complications

## 2017-11-06 ENCOUNTER — Encounter (HOSPITAL_COMMUNITY): Payer: Self-pay | Admitting: Surgery

## 2017-11-06 DIAGNOSIS — R1031 Right lower quadrant pain: Secondary | ICD-10-CM | POA: Diagnosis not present

## 2017-11-06 MED ORDER — OXYCODONE-ACETAMINOPHEN 5-325 MG PO TABS: 1.0000 | ORAL_TABLET | ORAL | 0 refills | Status: DC | PRN

## 2017-11-06 MED ORDER — TIOTROPIUM BROMIDE MONOHYDRATE 18 MCG IN CAPS: 18.0000 ug | ORAL_CAPSULE | Freq: Every day | RESPIRATORY_TRACT | Status: DC

## 2017-11-06 NOTE — Progress Notes (Signed)
Discharged home today accomp[anied by patient's friend. Discharged instuctions , personal belongings, prescription given. Verbalized understanding of instructions. No further questions asked

## 2017-11-06 NOTE — Discharge Instructions (Signed)
[facility] _______[facility] ° °UMBILICAL OR INGUINAL HERNIA REPAIR: POST OP INSTRUCTIONS ° °Always review your discharge instruction sheet given to you by the facility where your surgery was performed. °IF YOU HAVE DISABILITY OR FAMILY LEAVE FORMS, YOU MUST BRING THEM TO THE OFFICE FOR PROCESSING.   °DO NOT GIVE THEM TO YOUR DOCTOR. ° °1. A  prescription for pain medication may be given to you upon discharge.  Take your pain medication as prescribed, if needed.  If narcotic pain medicine is not needed, then you may take acetaminophen (Tylenol) or ibuprofen (Advil) as needed. °2. Take your usually prescribed medications unless otherwise directed. °If you need a refill on your pain medication, please contact your pharmacy.  They will contact our office to request authorization. Prescriptions will not be filled after 5 pm or on week-ends. °3. You should follow a light diet the first 24 hours after arrival home, such as soup and crackers, etc.  Be sure to include lots of fluids daily.  Resume your normal diet the day after surgery. °4.Most patients will experience some swelling and bruising around the umbilicus or in the groin and scrotum.  Ice packs and reclining will help.  Swelling and bruising can take several days to resolve.  °6. It is common to experience some constipation if taking pain medication after surgery.  Increasing fluid intake and taking a stool softener (such as Colace) will usually help or prevent this problem from occurring.  A mild laxative (Milk of Magnesia or Miralax) should be taken according to package directions if there are no bowel movements after 48 hours. °7. Unless discharge instructions indicate otherwise, you may remove your bandages 24-48 hours after surgery, and you may shower at that time.  You may have steri-strips (small skin tapes) in place directly over the incision.  These strips should be left on the skin for 7-10 days.  If your surgeon used skin glue on the  incision, you may shower in 24 hours.  The glue will flake off over the next 2-3 weeks.  Any sutures or staples will be removed at the office during your follow-up visit. °8. ACTIVITIES:  You may resume regular (light) daily activities beginning the next day--such as daily self-care, walking, climbing stairs--gradually increasing activities as tolerated.  You may have sexual intercourse when it is comfortable.  Refrain from any heavy lifting or straining until approved by your doctor. ° °a.You may drive when you are no longer taking prescription pain medication, you can comfortably wear a seatbelt, and you can safely maneuver your car and apply brakes. °b.RETURN TO WORK:   °_____________________________________________ ° °9.You should see your doctor in the office for a follow-up appointment approximately 2-3 weeks after your surgery.  Make sure that you call for this appointment within a day or two after you arrive home to insure a convenient appointment time. °10.OTHER INSTRUCTIONS: _________________________ °   _____________________________________ ° °WHEN TO CALL YOUR DOCTOR: °1. Fever over 101.0 °2. Inability to urinate °3. Nausea and/or vomiting °4. Extreme swelling or bruising °5. Continued bleeding from incision. °6. Increased pain, redness, or drainage from the incision ° °The clinic staff is available to answer your questions during regular business hours.  Please don’t hesitate to call and ask to speak to one of the nurses for clinical concerns.  If you have a medical emergency, go to the nearest emergency room or call 911.  A surgeon from [facility] is always on call at the hospital ° ° °  [address], [address], [place], [place]  [address] ? ° [address] [address], [place], [place]   [address] °[phone] ? [phone] ? FAX [phone] °Web site: [email or web address] °

## 2017-11-06 NOTE — Progress Notes (Signed)
Patient ID: Mitchell [name], male   DOB: [birth year], 72 y.o.   MRN: [id number]   Feels well Only mild post op pain Abdomen soft, incision looks good  Plan: Discharge home

## 2017-11-06 NOTE — Discharge Summary (Signed)
Physician Discharge Summary  Patient ID: Mitchell [name] MRN: [id number] DOB/AGE: 73-Apr-1947 73 y.o.  Admit date: 11/05/2017 Discharge date: 11/06/2017  Admission Diagnoses:  Discharge Diagnoses:  Active Problems:   Chronic groin pain, right   Discharged Condition: good  Hospital Course: uneventful post op recovery.  Discharged home POD#1  Consults: None  Significant Diagnostic Studies:   Treatments: surgery: right groin exploration with removal of mesh  Discharge Exam: Blood pressure (!) 115/59, pulse 66, temperature 98.8 F (37.1 C), temperature source Oral, resp. rate 16, height 6' (1.829 m), weight 84 kg (185 lb 3 oz), SpO2 96 %. General appearance: alert, cooperative and no distress Resp: clear to auscultation bilaterally Cardio: regular rate and rhythm, S1, S2 normal, no murmur, click, rub or gallop Incision/Wound: abdomen soft, incision clean, no hematoma  Disposition: Discharge disposition: 01-Home or Self Care       Discharge Instructions    Diet - low sodium heart healthy   Complete by:  As directed    Increase activity slowly   Complete by:  As directed      Allergies as of 11/06/2017      Reactions   Gabapentin Other (See Comments)   Tremors   Zocor [simvastatin] Other (See Comments)   Bone Pain   Lyrica [pregabalin] Other (See Comments)   Dizziness    Nsaids Other (See Comments)   Aneurism     Tape Rash, Other (See Comments)   REDNESS      Medication List    TAKE these medications   amLODipine 10 MG tablet Commonly known as:  NORVASC Take 10 mg by mouth daily.   aspirin 81 MG chewable tablet Chew 81 mg by mouth at bedtime.   atorvastatin 40 MG tablet Commonly known as:  LIPITOR Take 40 mg by mouth at bedtime.   benazepril 40 MG tablet Commonly known as:  LOTENSIN Take 40 mg by mouth daily.   cholecalciferol 1000 units tablet Commonly known as:  VITAMIN D Take 1,000 Units by mouth daily.   clopidogrel 75 MG tablet Commonly  known as:  PLAVIX TAKE ONE TABLET BY MOUTH ONCE DAILY   Coenzyme Q10 200 MG capsule Take 200 mg by mouth at bedtime.   COREG 25 MG tablet Generic drug:  carvedilol Take 1 tablet (25 mg total) by mouth 2 (two) times daily.   docusate sodium 100 MG capsule Commonly known as:  COLACE Take 100 mg by mouth 2 (two) times daily as needed for mild constipation.   FISH OIL TRIPLE STRENGTH 1400 MG Caps Take 1,400 mg by mouth every morning.   furosemide 20 MG tablet Commonly known as:  LASIX Take one tablet by mouth daily as needed for swelling/weight gain   glipiZIDE 10 MG tablet Commonly known as:  GLUCOTROL Take 5 mg by mouth 2 (two) times daily.   metFORMIN 500 MG (MOD) 24 hr tablet Commonly known as:  GLUMETZA Take 500 mg by mouth every evening.   nortriptyline 50 MG capsule Commonly known as:  PAMELOR Take 50 mg by mouth at bedtime.   oxyCODONE-acetaminophen 5-325 MG tablet Commonly known as:  PERCOCET/ROXICET Take 1 tablet by mouth every 4 (four) hours as needed for severe pain.   potassium chloride SA 20 MEQ tablet Commonly known as:  K-DUR,KLOR-CON Take 20 mEq by mouth 2 (two) times daily.   sildenafil 100 MG tablet Commonly known as:  VIAGRA Take 100 mg by mouth daily as needed for erectile dysfunction.   tiotropium 18 MCG inhalation  capsule Commonly known as:  SPIRIVA Place 18 mcg into inhaler and inhale daily.   traZODone 100 MG tablet Commonly known as:  DESYREL Take 100 mg by mouth at bedtime.   VOLTAREN 1 % Gel Generic drug:  diclofenac sodium Apply topically 2 (two) times daily as needed. AS NEEDED TO AFFECTED AREA      Follow-up Information    Coralie Keens, MD Follow up.   Specialty:  General Surgery Contact information: [address] [place] [address] [phone]           Signed: Harl Bowie 11/06/2017, 6:47 AM

## 2017-12-08 ENCOUNTER — Other Ambulatory Visit: Payer: Self-pay | Admitting: Internal Medicine

## 2017-12-08 DIAGNOSIS — I2510 Atherosclerotic heart disease of native coronary artery without angina pectoris: Secondary | ICD-10-CM

## 2017-12-08 NOTE — Telephone Encounter (Signed)
Refill Request.  

## 2018-01-01 ENCOUNTER — Encounter: Payer: Self-pay | Admitting: Internal Medicine

## 2018-01-01 ENCOUNTER — Ambulatory Visit (INDEPENDENT_AMBULATORY_CARE_PROVIDER_SITE_OTHER): Payer: Medicare Other | Admitting: Internal Medicine

## 2018-01-01 VITALS — BP 130/66 | HR 68 | Ht 72.0 in | Wt 184.0 lb

## 2018-01-01 DIAGNOSIS — I2510 Atherosclerotic heart disease of native coronary artery without angina pectoris: Secondary | ICD-10-CM | POA: Diagnosis not present

## 2018-01-01 DIAGNOSIS — I10 Essential (primary) hypertension: Secondary | ICD-10-CM

## 2018-01-01 DIAGNOSIS — E785 Hyperlipidemia, unspecified: Secondary | ICD-10-CM | POA: Diagnosis not present

## 2018-01-01 DIAGNOSIS — R1031 Right lower quadrant pain: Secondary | ICD-10-CM | POA: Diagnosis not present

## 2018-01-01 DIAGNOSIS — I739 Peripheral vascular disease, unspecified: Secondary | ICD-10-CM | POA: Diagnosis not present

## 2018-01-01 DIAGNOSIS — I5032 Chronic diastolic (congestive) heart failure: Secondary | ICD-10-CM | POA: Diagnosis not present

## 2018-01-01 NOTE — Progress Notes (Signed)
Follow-up Outpatient Visit Date: 01/01/2018  Primary Care Provider: Leonard Downing, MD [place] [address]  Chief Complaint: Follow-up coronary artery disease  HPI:  Mitchell [name] is a 72 y.o. year-old male with history of coronary artery disease status post CABG, AAA status post repair, hypertension, and type 2 diabetes mellitus, who presents for follow-up of CAD.  I last saw him in April, at which time his only concern was for continued right groin pain following hernia repair.  He underwent groin exploration in May with Dr. Ninfa Linden, given refractory symptoms.  He was noted to have mild leg edema and weight gain at our last visit, prompting [place] to start furosemide 20 mg daily.  Today, Mitchell [name] reports that he continues to do well from a heart standpoint.  He denies chest pain, palpitations, and lightheadedness.  He has stable exertional dyspnea that has been present for years as well as stable 2 pillow orthopnea.  Leg edema is stable to slightly improved since our last visit.  He is currently using as needed furosemide about once a week.  He continues to have right groin pain that radiates down the leg.  This did not improve much after his hernia revision, though previously noted scrotal pain and swelling are better.  He is awaiting orthopedics evaluation for possible musculoskeletal cause of these chronic symptoms.  Blood pressures at home have generally been well controlled in the 110-120/50-60 range.  --------------------------------------------------------------------------------------------------  PMH/PSH: 1. Coronary artery disease. The patient had non-ST-elevation MI in February 2008 and there was an 80-90% proximal LAD stenosis, 90%proximal first diagonal stenosis, 99% ostial ramus stenosis. The first obtuse marginal was subtotally occluded and there was an 80% mid RCA stenosis. EF was estimated 25% on ventriculogram, coronary artery bypass grafting was  done with a LIMA to the LAD, sequential saphenous vein graft to the PDA, and PLOM, saphenous graft to the diagonal, saphenous vein graft to the ramus. - Cardiolite (12/15) with EF 52%, no ischemia.  2. Aortic insufficiency: Echo (3/11): EF 55-60%, moderate diastolic dysfunction, moderate aortic insufficiency, moderate LAE. Echo (3/12) with EF 50-55%, grade II diastolic dysfunction, mild MR, mild aortic insufficiency. Echo (9/15) with EF 55-60%, mild AI, mild MR, mildly dilated RV with normal systolic function.  3. Infrarenal AAA. Abdominal US 3/11 with AAA 3.9 x 3.9 cm. Abdominal US 3/12 with AAA 3.8 x 3.9 cm. Abdominal US (3/13) with AAA 4.6 x 3.9 with complex plaque within the AAA. CTA abdomen showed 4 cm saccular infrarenal aneurysm. CTA abdomen 5/14 showed saccular AAA grown to 5.3 cm. EVAR for repair in 6/14.  4. Hypertension: Renal artery dopplers in 5/13 with no significant renal artery stenosis.  5. Type 2 diabetes. 6. History of torn left rotator cuff. 7. History of obstructive/restrictive PFTs. There is some reversibility with bronchodilator. The patient actually never smoked. He is on Spiriva. 8. Holter (3/10): Frequent PACs and blocked PACs. PVCs with trigeminy. No long pauses. Slowest HR in 40s while asleep.  9. Carotid stenosis. Carotid dopplers (3/12) with stable 40-59% bilateral ICA stenosis. Carotid dopplers (3/13) with 40-59% bilateral stenosis. Carotid dopplers (3/14) with 40-59% bilateral stenosis. Carotid dopplers (3/15) with 40-59% bilateral ICA stenosis. - Carotid dopplers (3/17): 40-59% BICA stenosis.  - Carotid dopplers (3/18): 1-39% BICA stenosis. >50% stenosis in BECAs. 10. Melanoma: s/p excision from back 5/12.  11. Right inguinal hernia s/p repair (11/17) and revision (8/17) and reexploration (5/19).  Recent CV Pertinent Labs: Lab Results  Component Value Date  CHOL 109 03/18/2017   HDL 31 (L) 03/18/2017   LDLCALC 58 03/18/2017   TRIG 99 03/18/2017     CHOLHDL 3.5 03/18/2017   CHOLHDL 3.8 03/04/2016   INR 1.06 12/11/2012   K 4.2 11/05/2017   K 4.3 10/23/2015   MG 2.1 12/11/2012   BUN 16 11/05/2017   BUN 31 (H) 09/29/2017   BUN 19.6 10/23/2015   CREATININE 1.10 11/05/2017   CREATININE 1.26 (H) 03/04/2016   CREATININE 1.4 (H) 10/23/2015    Past medical and surgical history were reviewed and updated in EPIC.  Current Meds  Medication Sig  . amLODipine (NORVASC) 10 MG tablet Take 10 mg by mouth daily.   Marland Kitchen aspirin 81 MG chewable tablet Chew 81 mg by mouth at bedtime.   Marland Kitchen atorvastatin (LIPITOR) 40 MG tablet Take 40 mg by mouth at bedtime.   . benazepril (LOTENSIN) 40 MG tablet Take 40 mg by mouth daily.   . carvedilol (COREG) 25 MG tablet Take 1 tablet (25 mg total) by mouth 2 (two) times daily.  . cholecalciferol (VITAMIN D) 1000 UNITS tablet Take 1,000 Units by mouth daily.  . clopidogrel (PLAVIX) 75 MG tablet TAKE 1 TABLET BY MOUTH ONCE DAILY  . Coenzyme Q10 200 MG capsule Take 200 mg by mouth at bedtime.   . docusate sodium (COLACE) 100 MG capsule Take 100 mg by mouth 2 (two) times daily as needed for mild constipation.  . furosemide (LASIX) 20 MG tablet Take one tablet by mouth daily as needed for swelling/weight gain  . glipiZIDE (GLUCOTROL) 10 MG tablet Take 5 mg by mouth 2 (two) times daily.   . metFORMIN (GLUMETZA) 500 MG (MOD) 24 hr tablet Take 500 mg by mouth every evening.   . nortriptyline (PAMELOR) 50 MG capsule Take 50 mg by mouth at bedtime.  . Omega-3 Fatty Acids (FISH OIL TRIPLE STRENGTH) 1400 MG CAPS Take 1,400 mg by mouth every morning.  Marland Kitchen oxyCODONE-acetaminophen (PERCOCET/ROXICET) 5-325 MG tablet Take 1 tablet by mouth every 4 (four) hours as needed for severe pain.  . potassium chloride SA (K-DUR,KLOR-CON) 20 MEQ tablet Take 20 mEq by mouth 2 (two) times daily.   . sildenafil (VIAGRA) 100 MG tablet Take 100 mg by mouth daily as needed for erectile dysfunction.  Marland Kitchen tiotropium (SPIRIVA) 18 MCG inhalation capsule  Place 18 mcg into inhaler and inhale daily.    . traZODone (DESYREL) 100 MG tablet Take 100 mg by mouth at bedtime.   . VOLTAREN 1 % GEL Apply topically 2 (two) times daily as needed. AS NEEDED TO AFFECTED AREA    Allergies: Gabapentin; Zocor [simvastatin]; Lyrica [pregabalin]; Nsaids; and Tape  Social History   Tobacco Use  . Smoking status: Never Smoker  . Smokeless tobacco: Never Used  Substance Use Topics  . Alcohol use: No    Alcohol/week: 0.0 oz  . Drug use: No    Family History  Problem Relation Age of Onset  . Hyperlipidemia Mother   . Hypertension Mother   . Heart disease Mother   . AAA (abdominal aortic aneurysm) Mother   . Hypertension Father   . AAA (abdominal aortic aneurysm) Father   . Heart disease Father   . Hyperlipidemia Father   . Throat cancer Maternal Uncle     Review of Systems: A 12-system review of systems was performed and was negative except as noted in the HPI.  --------------------------------------------------------------------------------------------------  Physical Exam: BP 130/66   Pulse 68   Ht 6' (1.829 m)  Wt 184 lb (83.5 kg)   BMI 24.95 kg/m   General: NAD. HEENT: No conjunctival pallor or scleral icterus. Moist mucous membranes.  OP clear. Neck: Supple without lymphadenopathy, thyromegaly, JVD, or HJR. Lungs: Normal work of breathing. Clear to auscultation bilaterally without wheezes or crackles. Heart: Regular rate and rhythm without murmurs, rubs, or gallops. Non-displaced PMI. Abd: Bowel sounds present. Soft, NT/ND without hepatosplenomegaly Ext: Trace pretibial edema bilaterally. Skin: Warm and dry without rash.  EKG:  NSR with isolated PVC.  Borderline LVH.  Other than PVC, no significant change from prior tracing on 03/07/17.  Lab Results  Component Value Date   WBC 7.3 10/31/2017   HGB 12.6 (L) 11/05/2017   HCT 37.0 (L) 11/05/2017   MCV 97.0 10/31/2017   PLT 177 10/31/2017    Lab Results  Component Value Date    NA 141 11/05/2017   K 4.2 11/05/2017   CL 103 11/05/2017   CO2 27 10/31/2017   BUN 16 11/05/2017   CREATININE 1.10 11/05/2017   GLUCOSE 152 (H) 11/05/2017   ALT 11 03/18/2017    Lab Results  Component Value Date   CHOL 109 03/18/2017   HDL 31 (L) 03/18/2017   LDLCALC 58 03/18/2017   TRIG 99 03/18/2017   CHOLHDL 3.5 03/18/2017    --------------------------------------------------------------------------------------------------  ASSESSMENT AND PLAN: Coronary artery disease without angina Mitchell [name] continues to do well without chest pain.  Of note, his anginal equivalent at the time of his MI was marked and experienced since his CABG.  We will continue his current medications for secondary prevention.  HFpEF Trace edema noted on exam.  Otherwise, Mitchell [name] appears euvolemic with stable NYHA class II symptoms.  We will continue his current medications including furosemide 20 mg daily as needed for swelling.  Hyperlipidemia LDL at goal.  Continue atorvastatin 40 mg nightly.  Hypertension Blood pressure borderline elevated today (goal less than 130/80) but better controlled at home.  Continue current regimen of amlodipine, enalapril, and carvedilol.  Right groin pain Further work-up per Dr. Ninfa Linden and orthopedics.  Peripheral vascular disease Continue secondary prevention and follow-up with vascular surgery.  Follow-up: Return to clinic in 6 months with Mitchell Dopp, PA.  Given my transition to the [facility], Mitchell [name] will subsequently follow-up with another provider at [facility]Harrell Gave Elfriede Bonini, MD 01/01/2018 2:32 PM

## 2018-01-01 NOTE — Patient Instructions (Addendum)
Medication Instructions:  Your physician recommends that you continue on your current medications as directed. Please refer to the Current Medication list given to you today.  -- If you need a refill on your cardiac medications before your next appointment, please call your pharmacy. --  Labwork: None ordered  Testing/Procedures: None ordered  Follow-Up: Your physician wants you to follow-up in: 6 months with Mitchell [name]   You will receive a reminder letter in the mail two months in advance. If you don't receive a letter, please call our office to schedule the follow-up appointment.  Thank you for choosing [facility]!!    Any Other Special Instructions Will Be Listed Below (If Applicable).

## 2018-01-03 ENCOUNTER — Encounter: Payer: Self-pay | Admitting: Internal Medicine

## 2018-01-03 DIAGNOSIS — I5032 Chronic diastolic (congestive) heart failure: Secondary | ICD-10-CM | POA: Insufficient documentation

## 2018-01-12 ENCOUNTER — Encounter (INDEPENDENT_AMBULATORY_CARE_PROVIDER_SITE_OTHER): Payer: Self-pay | Admitting: Orthopaedic Surgery

## 2018-01-12 ENCOUNTER — Ambulatory Visit (INDEPENDENT_AMBULATORY_CARE_PROVIDER_SITE_OTHER): Payer: Medicare Other | Admitting: Orthopaedic Surgery

## 2018-01-12 ENCOUNTER — Ambulatory Visit (INDEPENDENT_AMBULATORY_CARE_PROVIDER_SITE_OTHER): Payer: Medicare Other

## 2018-01-12 DIAGNOSIS — I6523 Occlusion and stenosis of bilateral carotid arteries: Secondary | ICD-10-CM

## 2018-01-12 DIAGNOSIS — R1031 Right lower quadrant pain: Secondary | ICD-10-CM | POA: Diagnosis not present

## 2018-01-12 NOTE — Progress Notes (Signed)
Office Visit Note   Patient: Mitchell [name]           Date of Birth: [birth year]           MRN: [id number] Visit Date: 01/12/2018              Requested by: Leonard Downing, MD [address] [place], [place] [address] PCP: Leonard Downing, MD   Assessment & Plan: Visit Diagnoses:  1. Right groin pain     Plan: There certainly can be a radicular component of his pain given what he describes groin pain that radiates into the thigh.  He has had multiple other surgical interventions to the groin area with continued pain.  Given the CT scan findings that we can see lumbar disc disease at the upper lumbar spine, there certainly could be nerve compression at this level that is causing the radicular symptoms into the groin and proximal thigh.  Given the failure of all other treatment modalities I do feel an MRI of the lumbar spine is warranted to assess for any nerve compression that can be causing his groin pain.  All question concerns were answered and addressed.  We will see him back after the MRI is obtained.  Follow-Up Instructions: Return in about 3 weeks (around 02/02/2018).   Orders:  Orders Placed This Encounter  Procedures  . XR HIP UNILAT W OR W/O PELVIS 1V RIGHT   No orders of the defined types were placed in this encounter.     Procedures: No procedures performed   Clinical Data: No additional findings.   Subjective: Chief Complaint  Patient presents with  . Right Hip - Pain  . Right Leg - Pain  The patient is seen today for evaluation and treatment of right hip and groin pain.  He has a complicated history and the fact that he has had multiple operations involving his right groin area with both an exploration and removal of previous mesh as well as an ablation of the right ileo-inguinal nerve.  He still reports pain in his groin that does radiate into his thigh area and down to his knee.  He denies any injury to this area.  It hurts on a daily basis  but not all the time.  Hurts with activities and sometimes with rest.  He denies any numbness and tingling in his feet.  HPI  Review of Systems He currently denies any headache or chest pain.  He denies any shortness of breath or fever or chills.  Objective: Vital Signs: There were no vitals taken for this visit.  Physical Exam He is alert and oriented x3 and in no acute distress Ortho Exam Examination of his right hip shows that moves fluidly with no pain at all in the groin area on examination the right hip.  There is no pain of the trochanteric area to palpation.  The extremes of rotation causes no pain in the hip joint itself. Specialty Comments:  No specialty comments available.  Imaging: Xr Hip Unilat W Or W/o Pelvis 1v Right  Result Date: 01/12/2018 An AP pelvis and lateral of the right hip shows a well-maintained joint space on both right and left hip there is no acute findings as it relates to either hip.  I was able to independently review a CT scan of his abdomen pelvis from last year.  CT scan does show his lumbar spine which shows significant degenerative disc disease with an actual slight scoliosis at the upper  aspect of the lumbar spine between L1-L3.  PMFS History: Patient Active Problem List   Diagnosis Date Noted  . Chronic heart failure with preserved ejection fraction ([place]) 01/03/2018  . Chronic groin pain, right 11/05/2017  . Aortic valve regurgitation 09/23/2017  . Coronary artery disease involving native coronary artery of native heart without angina pectoris 09/23/2017  . Right inguinal hernia 03/07/2017  . Right groin pain 01/29/2017  . Bilateral carotid artery disease ([place]) 09/21/2016  . S/P bilateral inguinal hernia repair 04/18/2016  . Acute prerenal failure ([place]) 10/24/2015  . Left rotator cuff tear 09/24/2014  . Injury of tendon of left rotator cuff 09/20/2014  . AAA (abdominal aortic aneurysm) without rupture ([place]) 07/19/2013  . Preoperative  clearance 11/04/2011  . Chronic total occlusion of artery of the extremities ([place]) 11/04/2011  . Range of motion deficit 09/19/2011  . Non-healing surgical wound 03/19/2011  . History of melanoma 09/16/2010  . CORONARY ATHEROSLERO UNSPEC TYPE BYPASS GRAFT 04/16/2010  . Aortic valve disorder 04/16/2010  . Peripheral vascular disease ([place]) 08/24/2009  . Hyperlipidemia LDL goal <70 08/23/2008  . Essential hypertension 08/23/2008  . CAD, ARTERY BYPASS GRAFT 08/23/2008  . CHRONIC DIASTOLIC HEART FAILURE 78/93/8101  . Bilateral carotid artery stenosis 08/23/2008  . AAA (abdominal aortic aneurysm) ([place]) 08/23/2008  . SYNCOPE 08/23/2008  . DYSPNEA 08/23/2008  . Coronary atherosclerosis 03/08/2007  . PULMONARY FIBROSIS, POSTINFLAMMATORY 03/08/2007  . CORONARY ARTERY BYPASS GRAFT, HX OF 03/08/2007   Past Medical History:  Diagnosis Date  . AAA (abdominal aortic aneurysm) ([place])    ultrasound done in 3/11 and showed AAA 3.9 x 3.9cm  . Altered taste   . Anemia    3 units of packed cells given post surgery for melanoma .  Marland Kitchen Anxiety   . Aortic insufficiency   . Arthritis    arm,   . CAD (coronary artery disease)    patient had non-ST-elevation MI in February 2008 and there was an 80-90% proximal LAD stenosis, 90% proximal first diagonal stenosis, 99% ostial ramus stenosis. The first obtuse marginal was subtotally occluded and there was an 80% mid RCA stenosis. EF was estimated 25% on ventriculogram, coronary artery bypass grafting was done with a LIMA to the LAD, sequential saphenous vein graft to the PDA,  . Cancer ([place])    melanoma  . Carotid stenosis    Carotid US 3/17: bilat ICA 40-59% >> FU 1 year  . CHF (congestive heart failure) ([place]) 2008  . COPD (chronic obstructive pulmonary disease) ([place])   . Depression   . Diabetes mellitus   . DM2 (diabetes mellitus, type 2) ([place])   . History of PFTs    obstructive/restrictive. There is some reversibility with bronchodilator. The patient  actually never smoked. He is on Spiriva  . HTN (hypertension)   . Hx of prostate biopsy    being watched carefully by Dr. Hartley Barefoot, pt. can't have MRI due to stent- per pt.    . Irregular heart beat   . Melanoma ([place]) 09/2010   pT2a N0 M0; clark level IV.   Marland Kitchen Myocardial infarction ([place]) 2008  . Neuromuscular disorder ([place])    numbness of L arm, lack of coordination, pt.told that its related to shoulder, post 4/16 RCT surgery    . Pain    back  . Range of motion deficit 09/19/2011  . Shortness of breath    with exeration  . Torn rotator cuff    hx of. left     Family History  Problem Relation Age of Onset  . Hyperlipidemia Mother   . Hypertension Mother   . Heart disease Mother   . AAA (abdominal aortic aneurysm) Mother   . Hypertension Father   . AAA (abdominal aortic aneurysm) Father   . Heart disease Father   . Hyperlipidemia Father   . Throat cancer Maternal Uncle     Past Surgical History:  Procedure Laterality Date  . ABDOMINAL AORTIC ANEURYSM REPAIR    . ABDOMINAL AORTIC ENDOVASCULAR STENT GRAFT N/A 12/11/2012   Procedure: ABDOMINAL AORTIC ENDOVASCULAR STENT GRAFT GORE;  Surgeon: Serafina Mitchell, MD;  Location: [facility];  Service: Vascular;  Laterality: N/A;  . ABLATION Right 01/29/2017   RIGHT GROIN EXPLORATION, ABLATION OF RIGHT ILEO-INGUINAL NERVE (Neurectomy)  . APPLICATION OF WOUND VAC    . ARTERIAL BYPASS SURGRY    . blood clots removed    . CORONARY ARTERY BYPASS GRAFT  07/2006  . EXCISION OF MESH Right 11/05/2017   Procedure: RIGHT GROIN EXPLORATION, REMOVAL OF MESH;  Surgeon: Coralie Keens, MD;  Location: [facility];  Service: General;  Laterality: Right;  . GROIN DISSECTION Right 01/29/2017   Procedure: RIGHT GROIN EXPLORATION, ABLATION OF RIGHT ILEO-INGUINAL NERVE;  Surgeon: Coralie Keens, MD;  Location: [facility];  Service: General;  Laterality: Right;  TAP BLOCK  . GROIN EXPLORATION Right 11/05/2017   RIGHT GROIN EXPLORATION, REMOVAL OF MESH  . INGUINAL HERNIA  REPAIR Bilateral 04/18/2016   Procedure: LAPAROSCOPIC BILATERAL INGUINAL HERNIA REPAIR;  Surgeon: Coralie Keens, MD;  Location: [facility];  Service: General;  Laterality: Bilateral;  . INSERTION OF MESH Bilateral 04/18/2016   Procedure: INSERTION OF MESH;  Surgeon: Coralie Keens, MD;  Location: [facility];  Service: General;  Laterality: Bilateral;  . MELANOMA EXCISION N/A    mid.- eventually needed wound vac for healing   . PROSTATE BIOPSY     being watched carefully by Dr. Hartley Barefoot, pt. can't have MRI due to stent- per pt.    Marland Kitchen SHOULDER ARTHROSCOPY WITH ROTATOR CUFF REPAIR AND SUBACROMIAL DECOMPRESSION Left 09/23/2014   Procedure: SHOULDER ARTHROSCOPY WITH ROTATOR CUFF REPAIR AND SUBACROMIAL DECOMPRESSION DISTAL CLAVICLE RESECTION;  Surgeon: Earlie Server, MD;  Location: [facility];  Service: Orthopedics;  Laterality: Left;  . TONSILLECTOMY  1951   Social History   Occupational History  . Occupation: retired  Tobacco Use  . Smoking status: Never Smoker  . Smokeless tobacco: Never Used  Substance and Sexual Activity  . Alcohol use: No    Alcohol/week: 0.0 oz  . Drug use: No  . Sexual activity: Not on file

## 2018-01-13 ENCOUNTER — Other Ambulatory Visit (INDEPENDENT_AMBULATORY_CARE_PROVIDER_SITE_OTHER): Payer: Self-pay

## 2018-01-13 DIAGNOSIS — M4807 Spinal stenosis, lumbosacral region: Secondary | ICD-10-CM

## 2018-01-21 ENCOUNTER — Ambulatory Visit
Admission: RE | Admit: 2018-01-21 | Discharge: 2018-01-21 | Disposition: A | Discharge status: Home / Self Care | Payer: Medicare Other | Source: Ambulatory Visit | Attending: Orthopaedic Surgery | Admitting: Orthopaedic Surgery

## 2018-01-21 DIAGNOSIS — M4807 Spinal stenosis, lumbosacral region: Secondary | ICD-10-CM

## 2018-01-31 IMAGING — CT CT CTA ABD/PEL W/CM AND/OR W/O CM
2 of 10 series · 11 of 46 positions shown, 15 images · IV contrast (isovue)
Comparison: Most recent prior CT scan of the abdomen and pelvis
07/18/2014

CLINICAL DATA: 69-year-old male with a history of abdominal aortic
aneurysm status [place] Fubara in 6441. Additional history includes
melanoma surgically excised in 1261.

EXAM:
CTA ABDOMEN AND PELVIS wITHOUT AND WITH CONTRAST
TECHNIQUE: Multidetector CT imaging of the abdomen and pelvis was performed
using the standard protocol during bolus administration of
intravenous contrast. Multiplanar reconstructed images and MIPs were
obtained and reviewed to evaluate the vascular anatomy.
Creatinine was obtained on site at [HOSPITAL] at [HOSPITAL].
Results: Creatinine 1.3 mg/dL.  GFR 55
CONTRAST:  100 mL Isovue 370

[Series 5: post stent angio · axial · 0.76mm/px · z∈[+643,+1060]mm · 10 of 161 slices shown, 13 images]
[im 11/161  soft-tissue]
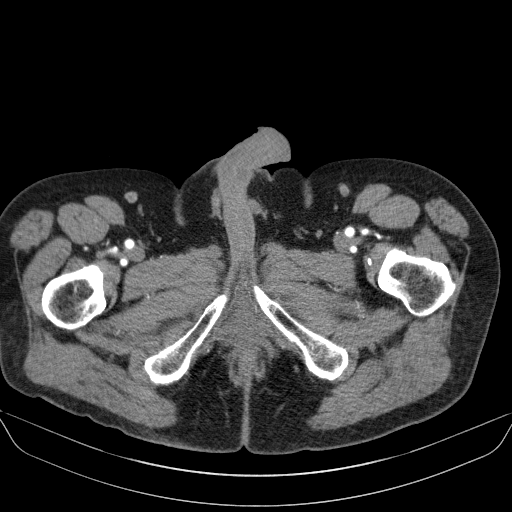
[im 11/161  bone]
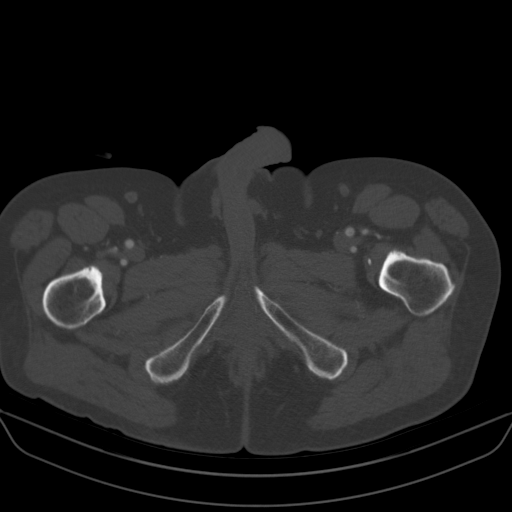
[im 33/161  soft-tissue]
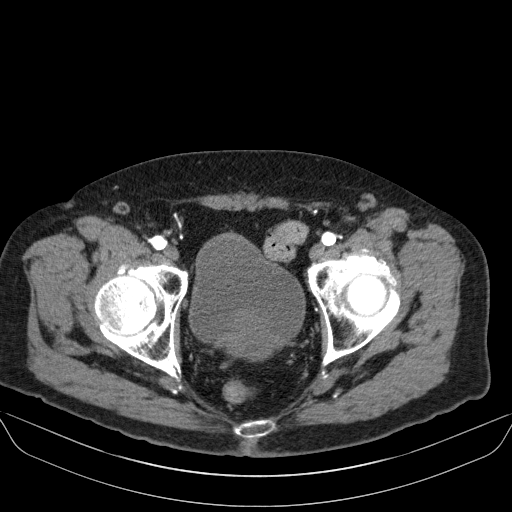
[im 54/161  soft-tissue]
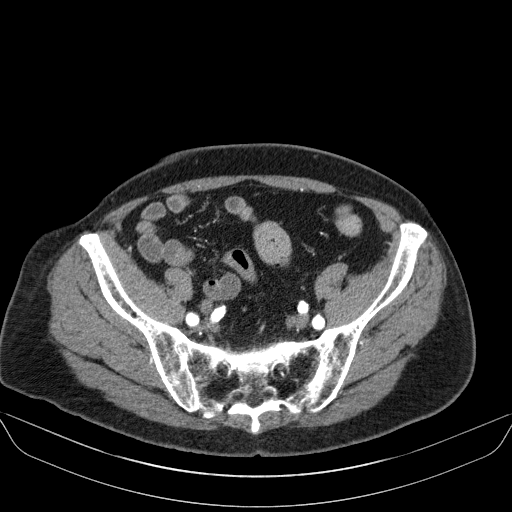
[im 75/161  soft-tissue]
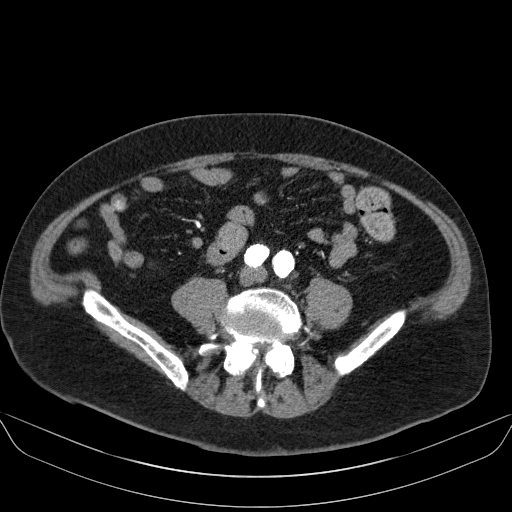
[im 86/161  soft-tissue]
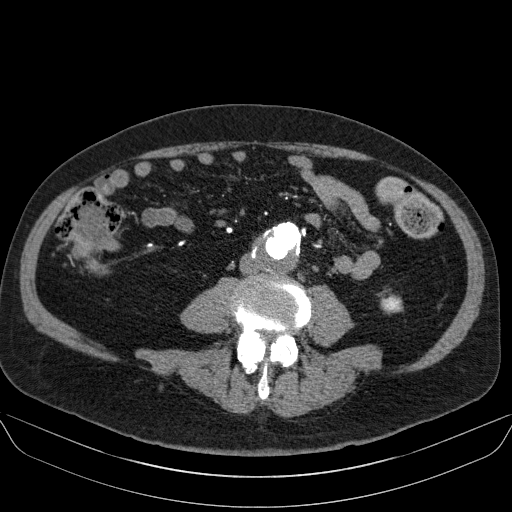
[im 107/161  soft-tissue]
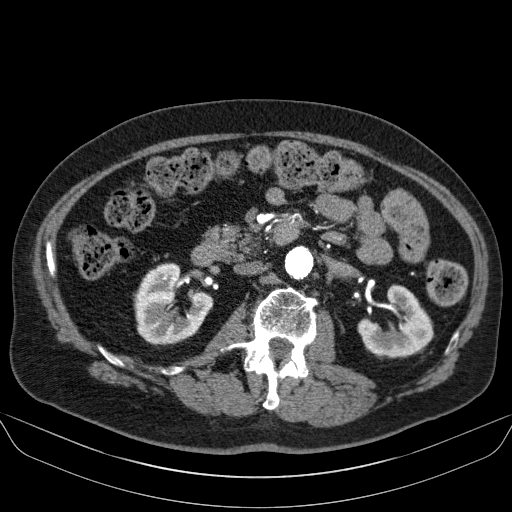
[im 118/161  lung]
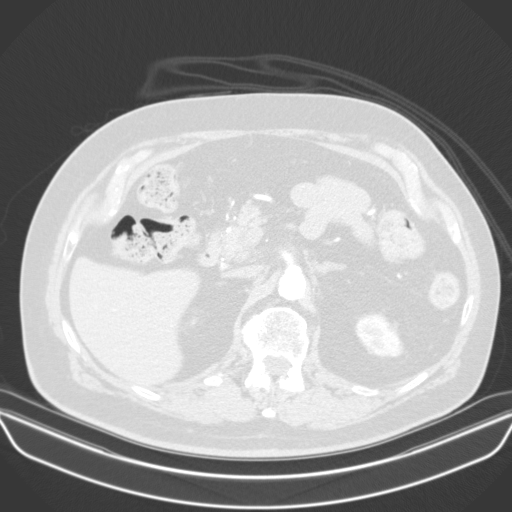
[im 129/161  soft-tissue]
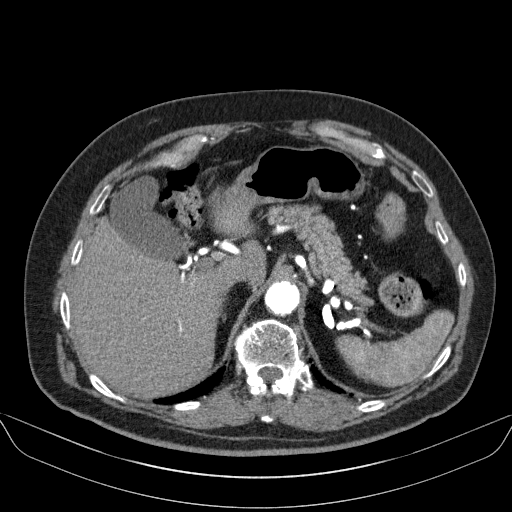
[im 129/161  lung]
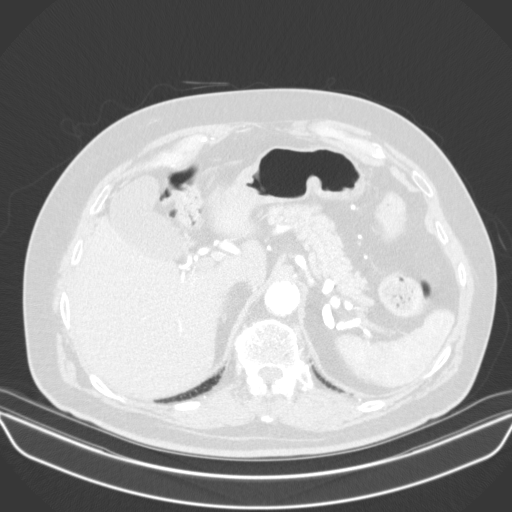
[im 139/161  lung]
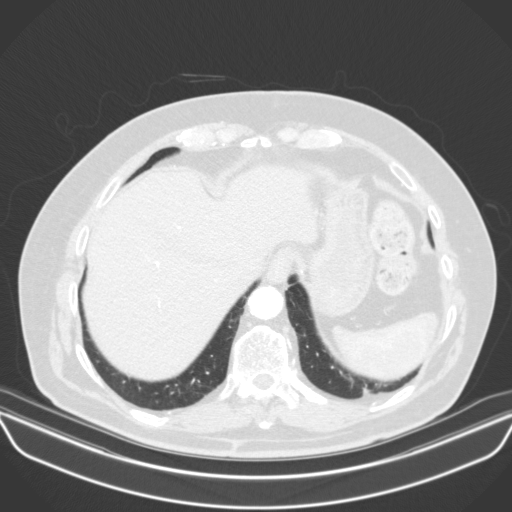
[im 150/161  soft-tissue]
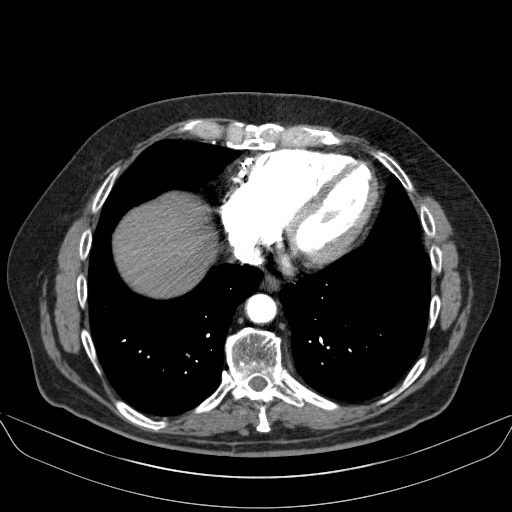
[im 150/161  lung]
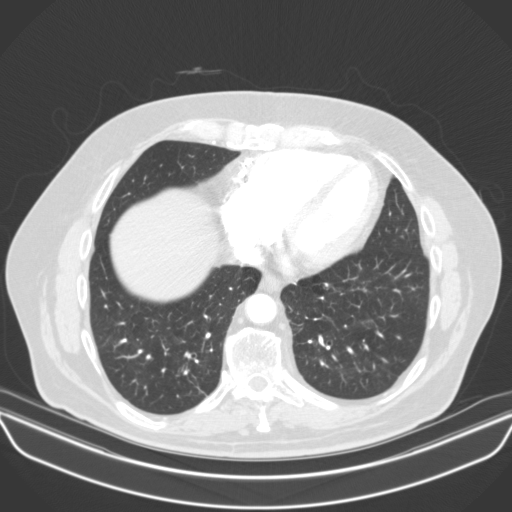

[Series 7: cor arterial · coronal · arterial · 0.77mm/px · 1 of 90 slices shown, 2 images]
[im 45/90  soft-tissue]
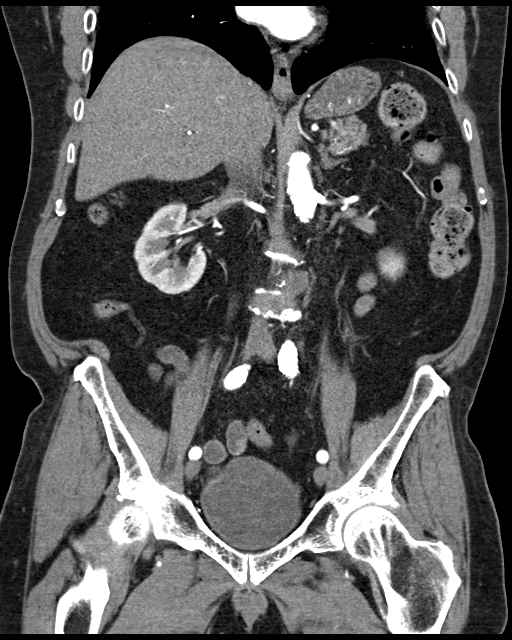
[im 45/90  bone]
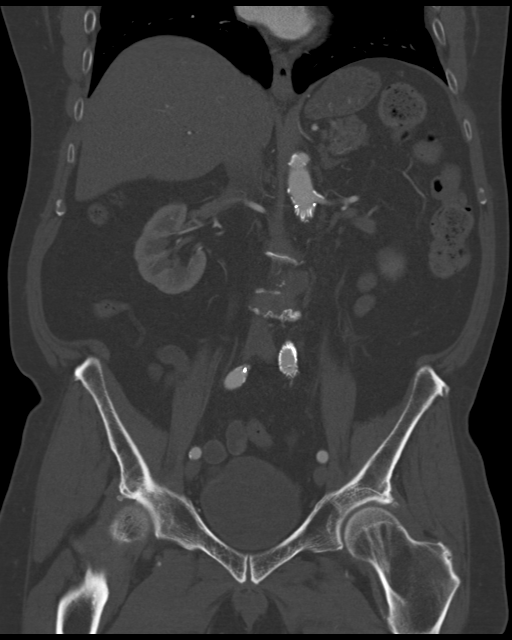

[11 of 46 positions shown; findings below may reference images not displayed]

FINDINGS: VASCULAR

Aorta: Surgical changes of endovascular repair of infrarenal
abdominal aortic aneurysm with a bifurcated endoprosthesis which
extends from just below the renal arteries into the bilateral common
iliac arteries. No evidence of graft migration or limb occlusion.
The excluded aneurysm sac is unchanged at 5.1 cm and maximal
diameter. The previously noted endoleak is right considerably less
conspicuous on today's examination. There is slight contrast
visualized within the aneurysm sac on the delayed phase imaging.

Celiac: Patent.  No aneurysm or dissection.

SMA: Atherosclerotic plaque results in mild narrowing at the origin.
The vessel is widely patent distally.

Renals: Solitary renal arteries bilaterally. Calcified
atherosclerotic plaque results in mild-to-moderate narrowing at the
origins bilaterally. No changes of fibromuscular dysplasia.

IMA: Patent.

Inflow: Both internal iliac arteries remain patent. Scattered
atherosclerotic plaque without significant stenosis.

Proximal Outflow: The common femoral and visualized portions of the
bilateral profunda and superficial femoral arteries are widely
patent. Minimal atherosclerotic plaque.

Veins: No focal venous abnormality.

Review of the MIP images confirms the above findings.

NON-VASCULAR

Lower chest: The lung bases are clear. Two year stability of a 4 mm
pulmonary nodule in the left lower lobe consistent with a benign
process. Visualized cardiac structures are within normal limits for
size. Right coronary artery calcifications. No pericardial effusion.
Unremarkable visualized distal thoracic esophagus.

Hepatobiliary: Normal hepatic contour and morphology. No discrete
hepatic lesion. Gallbladder is unremarkable. No intra or
extrahepatic biliary ductal dilatation.

Pancreas: No pancreatic ductal dilatation or evidence of mass.

Spleen: Unremarkable.

Adrenals/Urinary Tract: No adrenal mass or lesion. No evidence of
enhancing renal mass, hydronephrosis or nephrolithiasis.

Stomach/Bowel: Unremarkable CT appearance of the stomach and
duodenum. No focal bowel wall thickening or evidence of obstruction.
No significant diverticulosis. Normal appendix in the right lower
quadrant. The terminal ileum is unremarkable.

Lymphatic: No suspicious adenopathy.

Reproductive: Prostatomegaly.  Prostate gland measures up to 6.7 cm.

Musculoskeletal: No acute fracture or aggressive appearing lytic or
blastic osseous lesion. Mild levoconvex scoliosis centered at L2.
Multilevel degenerative disc disease.

Other: Stable size of omental fat containing left lower quadrant
direct inguinal hernia. No significant interval change in the
appearance of a small right inguinal hernia also containing fat with
both a direct and indirect component.
IMPRESSION: Vascular

1. Successful endovascular aortic repair of infrarenal abdominal
aortic aneurysm. The excluded aneurysm sac demonstrates no interval
change at 5.1 cm compared to July 18, 2014. The previously noted
type 2 endoleak is improving an is significantly less conspicuous on
today's examination.
Non Vascular

1. Ancillary findings without significant interval change as above
including bilateral left greater than right fat containing inguinal
hernias.

## 2018-02-04 ENCOUNTER — Ambulatory Visit (INDEPENDENT_AMBULATORY_CARE_PROVIDER_SITE_OTHER): Payer: Medicare Other | Admitting: Orthopaedic Surgery

## 2018-02-04 ENCOUNTER — Encounter (INDEPENDENT_AMBULATORY_CARE_PROVIDER_SITE_OTHER): Payer: Self-pay | Admitting: Orthopaedic Surgery

## 2018-02-04 ENCOUNTER — Other Ambulatory Visit (INDEPENDENT_AMBULATORY_CARE_PROVIDER_SITE_OTHER): Payer: Self-pay

## 2018-02-04 ENCOUNTER — Telehealth (INDEPENDENT_AMBULATORY_CARE_PROVIDER_SITE_OTHER): Payer: Self-pay

## 2018-02-04 DIAGNOSIS — I6523 Occlusion and stenosis of bilateral carotid arteries: Secondary | ICD-10-CM

## 2018-02-04 DIAGNOSIS — G8929 Other chronic pain: Secondary | ICD-10-CM

## 2018-02-04 DIAGNOSIS — M545 Low back pain: Principal | ICD-10-CM

## 2018-02-04 DIAGNOSIS — M5431 Sciatica, right side: Secondary | ICD-10-CM

## 2018-02-04 MED ORDER — OXYCODONE HCL 5 MG PO CAPS: 5.0000 mg | ORAL_CAPSULE | ORAL | 0 refills | Status: DC | PRN

## 2018-02-04 NOTE — Telephone Encounter (Signed)
They are aware this was ok to fill

## 2018-02-04 NOTE — Progress Notes (Signed)
The patient is returning for follow-up after having an MRI of his lumbar spine.  I got this MRI due to vague and odd pain he was having around the left side of his pelvis rating into his left thigh area.  He is actually seen by brother a Education officer, environmental and was worked up extensively for pelvic pain and hernia without finding significant problems.  Believe these had some type of surgery before in that area but we were convinced that this was not related to previous surgery in that area.  His hip exam is entirely normal and his x-rays of his right hip are normal.  With the continued pain that was radicular in nature I sent for an MRI of his lumbar spine.  He is here for review this today.  On exam he still has normal range of motion of his right hip and basically normal exam other than the pain in his proximal thigh and anterior thigh but also around the hip and pelvis area.  The MRI does show severe stenosis at L3-L4 that is multifactorial.  This is bilateral.  He also has some other disease that all relates to the right side.  At this point I do feel that it is worth having a right sided epidural steroid injection at the L3-L4 level.  We will send him to Dr. Ernestina Patches here in the office for an intervention.  I will then have Dr. Ernestina Patches send him back to me at least 3 to 4 weeks after this injection to see how that helps with the symptoms.  I gave him a one-time refill of his oxycodone.  He knows to use this sparingly.

## 2018-02-04 NOTE — Telephone Encounter (Signed)
Message left on triage phone:  Received e prescribe today for oxycodone 5 mg # 30 every 4 hrs prn and they filled rx for oxycodone 5/325 tid #25 on 01/29/18. Need to confirm the dosage change and if they are to fill today rx.

## 2018-02-12 ENCOUNTER — Telehealth (INDEPENDENT_AMBULATORY_CARE_PROVIDER_SITE_OTHER): Payer: Self-pay | Admitting: Orthopaedic Surgery

## 2018-02-12 MED ORDER — OXYCODONE HCL 5 MG PO TABS: 5.0000 mg | ORAL_TABLET | ORAL | 0 refills | Status: AC | PRN

## 2018-02-12 NOTE — Telephone Encounter (Signed)
Please advise 

## 2018-02-12 NOTE — Telephone Encounter (Signed)
I sent some in 

## 2018-02-12 NOTE — Telephone Encounter (Signed)
Patient called needing Rx refilled (Oxycodone) The number to contact patient is [phone]

## 2018-02-20 ENCOUNTER — Ambulatory Visit (INDEPENDENT_AMBULATORY_CARE_PROVIDER_SITE_OTHER): Payer: Self-pay

## 2018-02-20 ENCOUNTER — Ambulatory Visit (INDEPENDENT_AMBULATORY_CARE_PROVIDER_SITE_OTHER): Payer: Medicare Other | Admitting: Physical Medicine and Rehabilitation

## 2018-02-20 ENCOUNTER — Encounter (INDEPENDENT_AMBULATORY_CARE_PROVIDER_SITE_OTHER): Payer: Self-pay | Admitting: Physical Medicine and Rehabilitation

## 2018-02-20 VITALS — BP 129/66 | HR 60 | Temp 97.9°F

## 2018-02-20 DIAGNOSIS — M5416 Radiculopathy, lumbar region: Secondary | ICD-10-CM

## 2018-02-20 MED ORDER — BETAMETHASONE SOD PHOS & ACET 6 (3-3) MG/ML IJ SUSP
12.0000 mg | Freq: Once | INTRAMUSCULAR | Status: AC
  Administered 2018-02-20: 12 mg

## 2018-02-20 NOTE — Progress Notes (Signed)
 .  Numeric Pain Rating Scale and Functional Assessment Average Pain 8   In the last MONTH (on 0-10 scale) has pain interfered with the following?  1. General activity like being  able to carry out your everyday physical activities such as walking, climbing stairs, carrying groceries, or moving a chair?  Rating(4)   +Driver, +BT(plavix is ok for this procedure), -Dye Allergies.

## 2018-02-20 NOTE — Patient Instructions (Signed)

## 2018-02-25 ENCOUNTER — Telehealth (INDEPENDENT_AMBULATORY_CARE_PROVIDER_SITE_OTHER): Payer: Self-pay | Admitting: Orthopaedic Surgery

## 2018-02-25 NOTE — Telephone Encounter (Signed)
Patient called to request a refill for Oxycodone. Please call patient to advise [phone]

## 2018-02-25 NOTE — Telephone Encounter (Signed)
Please advise 

## 2018-02-25 NOTE — Telephone Encounter (Signed)
He needs to get his oxycodone from whoever provided it before and not me.  I saw him as a referral for groin pain and to see if it was coming from his hip versus his back.  He needs to be off of this medication from my standpoint.  Apparently he was on it for a while before seeing me and I refilled it once.  I'm not comfortable keeping him on this medication.  Would he like a Pain Management referral?

## 2018-02-26 NOTE — Telephone Encounter (Signed)
Patient aware of the below message  

## 2018-03-10 NOTE — Progress Notes (Signed)
Mitchell [name] - 72 y.o. male MRN [id number]  Date of birth: [birth year]  Office Visit Note: Visit Date: 02/20/2018 PCP: Leonard Downing, MD Referred by: Leonard Downing, *  Subjective: Chief Complaint  Patient presents with  . Right Hip - Pain  . Right Leg - Pain   HPI: Mitchell [name] is a 72 year old gentleman with right-sided low back and hip pain with some referral into the upper thigh and groin area.  He comes in today at request of Dr. Ninfa Linden for diagnostic and hopefully therapeutic L3 transforaminal injection on the right.  MRI does show severe stenosis at this level which is multifactorial with large disc bulge and smaller disc problem below that level.  Hip exam is been fairly normal.   ROS Otherwise per HPI.  Assessment & Plan: Visit Diagnoses:  1. Lumbar radiculopathy     Plan: No additional findings.   Meds & Orders:  Meds ordered this encounter  Medications  . betamethasone acetate-betamethasone sodium phosphate (CELESTONE) injection 12 mg    Orders Placed This Encounter  Procedures  . XR C-ARM NO REPORT  . Epidural Steroid injection    Follow-up: Return if symptoms worsen or fail to improve.   Procedures: No procedures performed  Lumbosacral Transforaminal Epidural Steroid Injection - Sub-Pedicular Approach with Fluoroscopic Guidance  Patient: Mitchell [name]      Date of Birth: 1946/04/05 MRN: [id number] PCP: Leonard Downing, MD      Visit Date: 02/20/2018   Universal Protocol:    Date/Time: 02/20/2018  Consent Given By: the patient  Position: PRONE  Additional Comments: Vital signs were monitored before and after the procedure. Patient was prepped and draped in the usual sterile fashion. The correct patient, procedure, and site was verified.   Injection Procedure Details:  Procedure Site One Meds Administered:  Meds ordered this encounter  Medications  . betamethasone acetate-betamethasone sodium phosphate (CELESTONE)  injection 12 mg    Laterality: Right  Location/Site:  L3-L4  Needle size: 22 G  Needle type: Spinal  Needle Placement: Transforaminal  Findings:    -Comments: Excellent flow of contrast along the nerve and into the epidural space.  Procedure Details: After squaring off the end-plates to get a true AP view, the C-arm was positioned so that an oblique view of the foramen as noted above was visualized. The target area is just inferior to the "nose of the scotty dog" or sub pedicular. The soft tissues overlying this structure were infiltrated with 2-3 ml. of 1% Lidocaine without Epinephrine.  The spinal needle was inserted toward the target using a "trajectory" view along the fluoroscope beam.  Under AP and lateral visualization, the needle was advanced so it did not puncture dura and was located close the 6 O'Clock position of the pedical in AP tracterory. Biplanar projections were used to confirm position. Aspiration was confirmed to be negative for CSF and/or blood. A 1-2 ml. volume of Isovue-250 was injected and flow of contrast was noted at each level. Radiographs were obtained for documentation purposes.   After attaining the desired flow of contrast documented above, a 0.5 to 1.0 ml test dose of 0.25% Marcaine was injected into each respective transforaminal space.  The patient was observed for 90 seconds post injection.  After no sensory deficits were reported, and normal lower extremity motor function was noted,   the above injectate was administered so that equal amounts of the injectate were placed at each foramen (level) into the transforaminal epidural space.  Additional Comments:  The patient tolerated the procedure well Dressing: Band-Aid    Post-procedure details: Patient was observed during the procedure. Post-procedure instructions were reviewed.  Patient left the clinic in stable condition.     Clinical History: MRI LUMBAR SPINE WITHOUT  CONTRAST  TECHNIQUE: Multiplanar, multisequence MR imaging of the lumbar spine was performed. No intravenous contrast was administered.  COMPARISON:  None.  FINDINGS: Segmentation: Normal. The lowest disc space is considered to be L5-S1.  Alignment:  Normal  Vertebrae: No acute compression fracture, discitis-osteomyelitis of focal marrow lesion.  Conus medullaris and cauda equina: The conus medullaris terminates at the L1 level. The cauda equina and conus medullaris are both normal.  Paraspinal and other soft tissues: Aortobiiliac stent graft is poorly visualized.  Disc levels:  T11-T12 is imaged only in the sagittal plane but is normal.  T12-L1: Small left eccentric disc bulge. Normal facet joints. No spinal canal or neural foraminal stenosis.  L1-L2: Mild disc bulge with mild narrowing of both neural foramina. No spinal canal stenosis. Normal facets.  L2-L3: Medium-sized diffuse disc bulge with small central annular fissure. Mild facet hypertrophy. No central spinal canal stenosis. Moderate right and mild left neural foraminal stenosis.  L3-L4: Severe facet hypertrophy with large disc bulge. Severe spinal canal stenosis and bilateral neural foraminal stenosis.  L4-L5: Medium-sized disc bulge with severe bilateral facet hypertrophy. Mild spinal canal stenosis with narrowing of both lateral recesses. Mild bilateral foraminal stenosis.  L5-S1: No disc herniation or stenosis.  IMPRESSION: 1. L3-L4 severe facet arthrosis, severe spinal canal stenosis and severe bilateral neural foraminal stenosis. 2. L4-L5 severe facet arthrosis with mild spinal canal stenosis and mild bilateral foraminal stenosis. 3. Moderate right L2-3 neural foraminal stenosis.   Electronically Signed   By: Ulyses Jarred M.D.   On: 01/22/2018 04:34   He reports that he has never smoked. He has never used smokeless tobacco. No results for input(s): HGBA1C, LABURIC in the  last 8760 hours.  Objective:  VS:  HT:    WT:   BMI:     BP:129/66  HR:60bpm  TEMP:97.9 F (36.6 C)(Oral)  RESP:  Physical Exam  Ortho Exam Imaging: No results found.  Past Medical/Family/Surgical/Social History: Medications & Allergies reviewed per EMR, new medications updated. Patient Active Problem List   Diagnosis Date Noted  . Sciatica, right side 02/04/2018  . Chronic heart failure with preserved ejection fraction ([place]) 01/03/2018  . Chronic groin pain, right 11/05/2017  . Aortic valve regurgitation 09/23/2017  . Coronary artery disease involving native coronary artery of native heart without angina pectoris 09/23/2017  . Right inguinal hernia 03/07/2017  . Right groin pain 01/29/2017  . Bilateral carotid artery disease ([place]) 09/21/2016  . S/P bilateral inguinal hernia repair 04/18/2016  . Acute prerenal failure ([place]) 10/24/2015  . Left rotator cuff tear 09/24/2014  . Injury of tendon of left rotator cuff 09/20/2014  . AAA (abdominal aortic aneurysm) without rupture ([place]) 07/19/2013  . Preoperative clearance 11/04/2011  . Chronic total occlusion of artery of the extremities ([place]) 11/04/2011  . Range of motion deficit 09/19/2011  . Non-healing surgical wound 03/19/2011  . History of melanoma 09/16/2010  . CORONARY ATHEROSLERO UNSPEC TYPE BYPASS GRAFT 04/16/2010  . Aortic valve disorder 04/16/2010  . Peripheral vascular disease ([place]) 08/24/2009  . Hyperlipidemia LDL goal <70 08/23/2008  . Essential hypertension 08/23/2008  . CAD, ARTERY BYPASS GRAFT 08/23/2008  . CHRONIC DIASTOLIC HEART FAILURE 36/62/9476  . Bilateral carotid artery stenosis 08/23/2008  . AAA (abdominal  aortic aneurysm) ([place]) 08/23/2008  . SYNCOPE 08/23/2008  . DYSPNEA 08/23/2008  . Coronary atherosclerosis 03/08/2007  . PULMONARY FIBROSIS, POSTINFLAMMATORY 03/08/2007  . CORONARY ARTERY BYPASS GRAFT, HX OF 03/08/2007   Past Medical History:  Diagnosis Date  . AAA (abdominal aortic aneurysm)  ([place])    ultrasound done in 3/11 and showed AAA 3.9 x 3.9cm  . Altered taste   . Anemia    3 units of packed cells given post surgery for melanoma .  Marland Kitchen Anxiety   . Aortic insufficiency   . Arthritis    arm,   . CAD (coronary artery disease)    patient had non-ST-elevation MI in February 2008 and there was an 80-90% proximal LAD stenosis, 90% proximal first diagonal stenosis, 99% ostial ramus stenosis. The first obtuse marginal was subtotally occluded and there was an 80% mid RCA stenosis. EF was estimated 25% on ventriculogram, coronary artery bypass grafting was done with a LIMA to the LAD, sequential saphenous vein graft to the PDA,  . Cancer ([place])    melanoma  . Carotid stenosis    Carotid US 3/17: bilat ICA 40-59% >> FU 1 year  . CHF (congestive heart failure) ([place]) 2008  . COPD (chronic obstructive pulmonary disease) ([place])   . Depression   . Diabetes mellitus   . DM2 (diabetes mellitus, type 2) ([place])   . History of PFTs    obstructive/restrictive. There is some reversibility with bronchodilator. The patient actually never smoked. He is on Spiriva  . HTN (hypertension)   . Hx of prostate biopsy    being watched carefully by Dr. Hartley Barefoot, pt. can't have MRI due to stent- per pt.    . Irregular heart beat   . Melanoma ([place]) 09/2010   pT2a N0 M0; clark level IV.   Marland Kitchen Myocardial infarction ([place]) 2008  . Neuromuscular disorder ([place])    numbness of L arm, lack of coordination, pt.told that its related to shoulder, post 4/16 RCT surgery    . Pain    back  . Range of motion deficit 09/19/2011  . Shortness of breath    with exeration  . Torn rotator cuff    hx of. left    Family History  Problem Relation Age of Onset  . Hyperlipidemia Mother   . Hypertension Mother   . Heart disease Mother   . AAA (abdominal aortic aneurysm) Mother   . Hypertension Father   . AAA (abdominal aortic aneurysm) Father   . Heart disease Father   . Hyperlipidemia Father   . Throat cancer Maternal  Uncle    Past Surgical History:  Procedure Laterality Date  . ABDOMINAL AORTIC ANEURYSM REPAIR    . ABDOMINAL AORTIC ENDOVASCULAR STENT GRAFT N/A 12/11/2012   Procedure: ABDOMINAL AORTIC ENDOVASCULAR STENT GRAFT GORE;  Surgeon: Serafina Mitchell, MD;  Location: [facility];  Service: Vascular;  Laterality: N/A;  . ABLATION Right 01/29/2017   RIGHT GROIN EXPLORATION, ABLATION OF RIGHT ILEO-INGUINAL NERVE (Neurectomy)  . APPLICATION OF WOUND VAC    . ARTERIAL BYPASS SURGRY    . blood clots removed    . CORONARY ARTERY BYPASS GRAFT  07/2006  . EXCISION OF MESH Right 11/05/2017   Procedure: RIGHT GROIN EXPLORATION, REMOVAL OF MESH;  Surgeon: Coralie Keens, MD;  Location: [facility];  Service: General;  Laterality: Right;  . GROIN DISSECTION Right 01/29/2017   Procedure: RIGHT GROIN EXPLORATION, ABLATION OF RIGHT ILEO-INGUINAL NERVE;  Surgeon: Coralie Keens, MD;  Location: [facility];  Service: General;  Laterality: Right;  TAP BLOCK  . GROIN EXPLORATION Right 11/05/2017   RIGHT GROIN EXPLORATION, REMOVAL OF MESH  . INGUINAL HERNIA REPAIR Bilateral 04/18/2016   Procedure: LAPAROSCOPIC BILATERAL INGUINAL HERNIA REPAIR;  Surgeon: Coralie Keens, MD;  Location: [facility];  Service: General;  Laterality: Bilateral;  . INSERTION OF MESH Bilateral 04/18/2016   Procedure: INSERTION OF MESH;  Surgeon: Coralie Keens, MD;  Location: [facility];  Service: General;  Laterality: Bilateral;  . MELANOMA EXCISION N/A    mid.- eventually needed wound vac for healing   . PROSTATE BIOPSY     being watched carefully by Dr. Hartley Barefoot, pt. can't have MRI due to stent- per pt.    Marland Kitchen SHOULDER ARTHROSCOPY WITH ROTATOR CUFF REPAIR AND SUBACROMIAL DECOMPRESSION Left 09/23/2014   Procedure: SHOULDER ARTHROSCOPY WITH ROTATOR CUFF REPAIR AND SUBACROMIAL DECOMPRESSION DISTAL CLAVICLE RESECTION;  Surgeon: Earlie Server, MD;  Location: [facility];  Service: Orthopedics;  Laterality: Left;  . TONSILLECTOMY  1951   Social History   Occupational  History  . Occupation: retired  Tobacco Use  . Smoking status: Never Smoker  . Smokeless tobacco: Never Used  Substance and Sexual Activity  . Alcohol use: No    Alcohol/week: 0.0 standard drinks  . Drug use: No  . Sexual activity: Not on file

## 2018-03-10 NOTE — Procedures (Signed)
Lumbosacral Transforaminal Epidural Steroid Injection - Sub-Pedicular Approach with Fluoroscopic Guidance  Patient: Mitchell [name]      Date of Birth: 03/05/1946 MRN: [id number] PCP: Leonard Downing, MD      Visit Date: 02/20/2018   Universal Protocol:    Date/Time: 02/20/2018  Consent Given By: the patient  Position: PRONE  Additional Comments: Vital signs were monitored before and after the procedure. Patient was prepped and draped in the usual sterile fashion. The correct patient, procedure, and site was verified.   Injection Procedure Details:  Procedure Site One Meds Administered:  Meds ordered this encounter  Medications  . betamethasone acetate-betamethasone sodium phosphate (CELESTONE) injection 12 mg    Laterality: Right  Location/Site:  L3-L4  Needle size: 22 G  Needle type: Spinal  Needle Placement: Transforaminal  Findings:    -Comments: Excellent flow of contrast along the nerve and into the epidural space.  Procedure Details: After squaring off the end-plates to get a true AP view, the C-arm was positioned so that an oblique view of the foramen as noted above was visualized. The target area is just inferior to the "nose of the scotty dog" or sub pedicular. The soft tissues overlying this structure were infiltrated with 2-3 ml. of 1% Lidocaine without Epinephrine.  The spinal needle was inserted toward the target using a "trajectory" view along the fluoroscope beam.  Under AP and lateral visualization, the needle was advanced so it did not puncture dura and was located close the 6 O'Clock position of the pedical in AP tracterory. Biplanar projections were used to confirm position. Aspiration was confirmed to be negative for CSF and/or blood. A 1-2 ml. volume of Isovue-250 was injected and flow of contrast was noted at each level. Radiographs were obtained for documentation purposes.   After attaining the desired flow of contrast documented above, a  0.5 to 1.0 ml test dose of 0.25% Marcaine was injected into each respective transforaminal space.  The patient was observed for 90 seconds post injection.  After no sensory deficits were reported, and normal lower extremity motor function was noted,   the above injectate was administered so that equal amounts of the injectate were placed at each foramen (level) into the transforaminal epidural space.   Additional Comments:  The patient tolerated the procedure well Dressing: Band-Aid    Post-procedure details: Patient was observed during the procedure. Post-procedure instructions were reviewed.  Patient left the clinic in stable condition.

## 2018-03-17 ENCOUNTER — Telehealth (INDEPENDENT_AMBULATORY_CARE_PROVIDER_SITE_OTHER): Payer: Self-pay | Admitting: Physical Medicine and Rehabilitation

## 2018-03-17 NOTE — Telephone Encounter (Signed)
Could consider repeat versus facet block or f/u Ninfa Linden

## 2018-03-17 NOTE — Telephone Encounter (Signed)
Scheduled for 03/26/18 at 1445.

## 2018-03-26 ENCOUNTER — Encounter (INDEPENDENT_AMBULATORY_CARE_PROVIDER_SITE_OTHER): Payer: Self-pay | Admitting: Physical Medicine and Rehabilitation

## 2018-03-26 ENCOUNTER — Ambulatory Visit (INDEPENDENT_AMBULATORY_CARE_PROVIDER_SITE_OTHER): Payer: Medicare Other | Admitting: Physical Medicine and Rehabilitation

## 2018-03-26 ENCOUNTER — Ambulatory Visit (INDEPENDENT_AMBULATORY_CARE_PROVIDER_SITE_OTHER): Payer: Self-pay

## 2018-03-26 VITALS — BP 130/66 | HR 58 | Temp 97.6°F

## 2018-03-26 DIAGNOSIS — M5416 Radiculopathy, lumbar region: Secondary | ICD-10-CM

## 2018-03-26 DIAGNOSIS — M48062 Spinal stenosis, lumbar region with neurogenic claudication: Secondary | ICD-10-CM | POA: Diagnosis not present

## 2018-03-26 MED ORDER — BETAMETHASONE SOD PHOS & ACET 6 (3-3) MG/ML IJ SUSP
12.0000 mg | Freq: Once | INTRAMUSCULAR | Status: AC
  Administered 2018-03-26: 12 mg

## 2018-03-26 NOTE — Progress Notes (Signed)
 .  Numeric Pain Rating Scale and Functional Assessment Average Pain 8   In the last MONTH (on 0-10 scale) has pain interfered with the following?  1. General activity like being  able to carry out your everyday physical activities such as walking, climbing stairs, carrying groceries, or moving a chair?  Rating(4)   +Driver, +BT(plavix ok for inj), -Dye Allergies.

## 2018-03-26 NOTE — Patient Instructions (Signed)

## 2018-04-10 ENCOUNTER — Telehealth (INDEPENDENT_AMBULATORY_CARE_PROVIDER_SITE_OTHER): Payer: Self-pay | Admitting: *Deleted

## 2018-04-13 NOTE — Telephone Encounter (Signed)
Called patient to advise. Scheduled patient to see Dr. Ninfa Linden to discuss.

## 2018-04-13 NOTE — Procedures (Signed)
Lumbosacral Transforaminal Epidural Steroid Injection - Sub-Pedicular Approach with Fluoroscopic Guidance  Patient: Mitchell [name]      Date of Birth: 1945-08-31 MRN: [id number] PCP: Leonard Downing, MD      Visit Date: 03/26/2018   Universal Protocol:    Date/Time: 03/26/2018  Consent Given By: the patient  Position: PRONE  Additional Comments: Vital signs were monitored before and after the procedure. Patient was prepped and draped in the usual sterile fashion. The correct patient, procedure, and site was verified.   Injection Procedure Details:  Procedure Site One Meds Administered:  Meds ordered this encounter  Medications  . betamethasone acetate-betamethasone sodium phosphate (CELESTONE) injection 12 mg    Laterality: Right  Location/Site:  L3-L4  Needle size: 22 G  Needle type: Spinal  Needle Placement: Transforaminal  Findings:    -Comments: Excellent flow of contrast along the nerve and into the epidural space.  Procedure Details: After squaring off the end-plates to get a true AP view, the C-arm was positioned so that an oblique view of the foramen as noted above was visualized. The target area is just inferior to the "nose of the scotty dog" or sub pedicular. The soft tissues overlying this structure were infiltrated with 2-3 ml. of 1% Lidocaine without Epinephrine.  The spinal needle was inserted toward the target using a "trajectory" view along the fluoroscope beam.  Under AP and lateral visualization, the needle was advanced so it did not puncture dura and was located close the 6 O'Clock position of the pedical in AP tracterory. Biplanar projections were used to confirm position. Aspiration was confirmed to be negative for CSF and/or blood. A 1-2 ml. volume of Isovue-250 was injected and flow of contrast was noted at each level. Radiographs were obtained for documentation purposes.   After attaining the desired flow of contrast documented above, a  0.5 to 1.0 ml test dose of 0.25% Marcaine was injected into each respective transforaminal space.  The patient was observed for 90 seconds post injection.  After no sensory deficits were reported, and normal lower extremity motor function was noted,   the above injectate was administered so that equal amounts of the injectate were placed at each foramen (level) into the transforaminal epidural space.   Additional Comments:  The patient tolerated the procedure well Dressing: Band-Aid    Post-procedure details: Patient was observed during the procedure. Post-procedure instructions were reviewed.  Patient left the clinic in stable condition.

## 2018-04-13 NOTE — Progress Notes (Signed)
Mitchell [name] - 72 y.o. male MRN [id number]  Date of birth: 1945-07-23  Office Visit Note: Visit Date: 03/26/2018 PCP: Mitchell Downing, MD Referred by: Mitchell [name], *  Subjective: Chief Complaint  Patient presents with  . Lower Back - Pain  . Right Hip - Pain  . Right Leg - Pain   HPI:  Mitchell [name] is a 72 y.o. male who comes in today For planned interventional spine procedure.  His brief history is such that he has had ongoing right groin pain and testicular type pain.  He was originally seen by Dr. Coralie Keens and ultimately had exploration of inguinal hernia he has had mesh removal and is also had ablation of the ilioinguinal nerve.  He continues to have his groin pain.  He was then evaluated by Dr. Jean Rosenthal and this was not felt to be related to his hips.  MRI of the lumbar spine was performed that shows severe stenosis at L3-4 with severe facet arthropathy at L2-3 and lesser at L4-5.  We saw him at the request of Dr. Jean Rosenthal and completed a right L3 transforaminal injection with 3 days of relief of his symptoms that were almost complete relief but just for 3 days.  He does take substantial oxycodone because of the pain.  He has to sit a lot and he does work as a Environmental manager.  We are going to repeat the L3 transforaminal injection one time and he will continue follow-up with Dr. Ninfa Linden.  Spine surgery referral may be necessary.  ROS Otherwise per HPI.  Assessment & Plan: Visit Diagnoses:  1. Lumbar radiculopathy   2. Spinal stenosis of lumbar region with neurogenic claudication     Plan: No additional findings.   Meds & Orders:  Meds ordered this encounter  Medications  . betamethasone acetate-betamethasone sodium phosphate (CELESTONE) injection 12 mg    Orders Placed This Encounter  Procedures  . XR C-ARM NO REPORT  . Epidural Steroid injection    Follow-up: Return if symptoms worsen or fail to improve.    Procedures: No procedures performed  Lumbosacral Transforaminal Epidural Steroid Injection - Sub-Pedicular Approach with Fluoroscopic Guidance  Patient: Mitchell [name]      Date of Birth: 1945-09-19 MRN: [id number] PCP: Mitchell Downing, MD      Visit Date: 03/26/2018   Universal Protocol:    Date/Time: 03/26/2018  Consent Given By: the patient  Position: PRONE  Additional Comments: Vital signs were monitored before and after the procedure. Patient was prepped and draped in the usual sterile fashion. The correct patient, procedure, and site was verified.   Injection Procedure Details:  Procedure Site One Meds Administered:  Meds ordered this encounter  Medications  . betamethasone acetate-betamethasone sodium phosphate (CELESTONE) injection 12 mg    Laterality: Right  Location/Site:  L3-L4  Needle size: 22 G  Needle type: Spinal  Needle Placement: Transforaminal  Findings:    -Comments: Excellent flow of contrast along the nerve and into the epidural space.  Procedure Details: After squaring off the end-plates to get a true AP view, the C-arm was positioned so that an oblique view of the foramen as noted above was visualized. The target area is just inferior to the "nose of the scotty dog" or sub pedicular. The soft tissues overlying this structure were infiltrated with 2-3 ml. of 1% Lidocaine without Epinephrine.  The spinal needle was inserted toward the target using a "trajectory" view along the fluoroscope beam.  Under AP and lateral visualization, the needle was advanced so it did not puncture dura and was located close the 6 O'Clock position of the pedical in AP tracterory. Biplanar projections were used to confirm position. Aspiration was confirmed to be negative for CSF and/or blood. A 1-2 ml. volume of Isovue-250 was injected and flow of contrast was noted at each level. Radiographs were obtained for documentation purposes.   After attaining the  desired flow of contrast documented above, a 0.5 to 1.0 ml test dose of 0.25% Marcaine was injected into each respective transforaminal space.  The patient was observed for 90 seconds post injection.  After no sensory deficits were reported, and normal lower extremity motor function was noted,   the above injectate was administered so that equal amounts of the injectate were placed at each foramen (level) into the transforaminal epidural space.   Additional Comments:  The patient tolerated the procedure well Dressing: Band-Aid    Post-procedure details: Patient was observed during the procedure. Post-procedure instructions were reviewed.  Patient left the clinic in stable condition.     Clinical History: MRI LUMBAR SPINE WITHOUT CONTRAST  TECHNIQUE: Multiplanar, multisequence MR imaging of the lumbar spine was performed. No intravenous contrast was administered.  COMPARISON:  None.  FINDINGS: Segmentation: Normal. The lowest disc space is considered to be L5-S1.  Alignment:  Normal  Vertebrae: No acute compression fracture, discitis-osteomyelitis of focal marrow lesion.  Conus medullaris and cauda equina: The conus medullaris terminates at the L1 level. The cauda equina and conus medullaris are both normal.  Paraspinal and other soft tissues: Aortobiiliac stent graft is poorly visualized.  Disc levels:  T11-T12 is imaged only in the sagittal plane but is normal.  T12-L1: Small left eccentric disc bulge. Normal facet joints. No spinal canal or neural foraminal stenosis.  L1-L2: Mild disc bulge with mild narrowing of both neural foramina. No spinal canal stenosis. Normal facets.  L2-L3: Medium-sized diffuse disc bulge with small central annular fissure. Mild facet hypertrophy. No central spinal canal stenosis. Moderate right and mild left neural foraminal stenosis.  L3-L4: Severe facet hypertrophy with large disc bulge. Severe spinal canal stenosis  and bilateral neural foraminal stenosis.  L4-L5: Medium-sized disc bulge with severe bilateral facet hypertrophy. Mild spinal canal stenosis with narrowing of both lateral recesses. Mild bilateral foraminal stenosis.  L5-S1: No disc herniation or stenosis.  IMPRESSION: 1. L3-L4 severe facet arthrosis, severe spinal canal stenosis and severe bilateral neural foraminal stenosis. 2. L4-L5 severe facet arthrosis with mild spinal canal stenosis and mild bilateral foraminal stenosis. 3. Moderate right L2-3 neural foraminal stenosis.   Electronically Signed   By: Ulyses Jarred M.D.   On: 01/22/2018 04:34     Objective:  VS:  HT:    WT:   BMI:     BP:130/66  HR:(!) 58bpm  TEMP:97.6 F (36.4 C)(Oral)  RESP:  Physical Exam  Ortho Exam Imaging: No results found.

## 2018-04-13 NOTE — Telephone Encounter (Signed)
He should likely follow-up with Dr. Ninfa Linden and they can discuss the next step which may be spine surgery evaluation.

## 2018-04-20 ENCOUNTER — Ambulatory Visit (INDEPENDENT_AMBULATORY_CARE_PROVIDER_SITE_OTHER): Payer: Medicare Other | Admitting: Cardiology

## 2018-04-20 ENCOUNTER — Encounter: Payer: Self-pay | Admitting: Cardiology

## 2018-04-20 VITALS — BP 126/68 | HR 72 | Ht 72.0 in | Wt 184.0 lb

## 2018-04-20 DIAGNOSIS — I6523 Occlusion and stenosis of bilateral carotid arteries: Secondary | ICD-10-CM | POA: Diagnosis not present

## 2018-04-20 DIAGNOSIS — I351 Nonrheumatic aortic (valve) insufficiency: Secondary | ICD-10-CM | POA: Diagnosis not present

## 2018-04-20 DIAGNOSIS — E785 Hyperlipidemia, unspecified: Secondary | ICD-10-CM

## 2018-04-20 DIAGNOSIS — I2510 Atherosclerotic heart disease of native coronary artery without angina pectoris: Secondary | ICD-10-CM | POA: Diagnosis not present

## 2018-04-20 DIAGNOSIS — I5032 Chronic diastolic (congestive) heart failure: Secondary | ICD-10-CM

## 2018-04-20 DIAGNOSIS — I10 Essential (primary) hypertension: Secondary | ICD-10-CM

## 2018-04-20 LAB — HEPATIC FUNCTION PANEL
ALK PHOS: 61 IU/L (ref 39–117)
ALT: 16 IU/L (ref 0–44)
AST: 14 IU/L (ref 0–40)
Albumin: 4.1 g/dL (ref 3.5–4.8)
Bilirubin Total: 0.4 mg/dL (ref 0.0–1.2)
Bilirubin, Direct: 0.1 mg/dL (ref 0.00–0.40)
Total Protein: 7.2 g/dL (ref 6.0–8.5)

## 2018-04-20 LAB — LIPID PANEL
CHOLESTEROL TOTAL: 128 mg/dL (ref 100–199)
Chol/HDL Ratio: 3.6 ratio (ref 0.0–5.0)
HDL: 36 mg/dL — AB (ref >39)
LDL CALC: 67 mg/dL (ref 0–99)
Triglycerides: 124 mg/dL (ref 0–149)
VLDL CHOLESTEROL CAL: 25 mg/dL (ref 5–40)

## 2018-04-20 LAB — BASIC METABOLIC PANEL
BUN/Creatinine Ratio: 18 (ref 10–24)
BUN: 20 mg/dL (ref 8–27)
CALCIUM: 9.4 mg/dL (ref 8.6–10.2)
CHLORIDE: 99 mmol/L (ref 96–106)
CO2: 23 mmol/L (ref 20–29)
CREATININE: 1.11 mg/dL (ref 0.76–1.27)
GFR calc non Af Amer: 66 mL/min/{1.73_m2} (ref >59)
GFR, EST AFRICAN AMERICAN: 76 mL/min/{1.73_m2} (ref >59)
GLUCOSE: 164 mg/dL — AB (ref 65–99)
Potassium: 4.5 mmol/L (ref 3.5–5.2)
Sodium: 141 mmol/L (ref 134–144)

## 2018-04-20 NOTE — Progress Notes (Signed)
Cardiology Office Note:    Date:  04/20/2018   ID:  Edras, Wilford [birth year], MRN [id number]  PCP:  Leonard Downing, MD  Cardiologist:  Nelva Bush, MD    Referring MD: Leonard Downing, *   No chief complaint on file.   History of Present Illness:    Mitchell [name] is a 72 y.o. male with a hx of coronary artery disease status post CABG, AAA status post repair, hypertension, and type 2 diabetes mellitus, who presents for follow-up of CAD.  He has stable exertional dyspnea that has been present for years as well as stable 2 pillow orthopnea.  He has chronic Leg edema that is stable and currently uses as needed furosemide about once a week.  He continues to have right groin pain that radiates down the leg.  This did not improve much after his hernia revision, though previously noted scrotal pain and swelling are better.  He denies any chest pain or pressure, PND, orthopnea, dizziness, palpitations or syncope.      Past Medical History:  Diagnosis Date  . AAA (abdominal aortic aneurysm) ([place])    ultrasound done in 3/11 and showed AAA 3.9 x 3.9cm  . Altered taste   . Anemia    3 units of packed cells given post surgery for melanoma .  Marland Kitchen Anxiety   . Aortic insufficiency   . Arthritis    arm,   . CAD (coronary artery disease)    patient had non-ST-elevation MI in February 2008 and there was an 80-90% proximal LAD stenosis, 90% proximal first diagonal stenosis, 99% ostial ramus stenosis. The first obtuse marginal was subtotally occluded and there was an 80% mid RCA stenosis. EF was estimated 25% on ventriculogram, coronary artery bypass grafting was done with a LIMA to the LAD, sequential saphenous vein graft to the PDA,  . Cancer ([place])    melanoma  . Carotid stenosis    Carotid US 3/17: bilat ICA 40-59% >> FU 1 year  . CHF (congestive heart failure) ([place]) 2008  . COPD (chronic obstructive pulmonary disease) ([place])   . Depression   . Diabetes mellitus   . DM2  (diabetes mellitus, type 2) ([place])   . History of PFTs    obstructive/restrictive. There is some reversibility with bronchodilator. The patient actually never smoked. He is on Spiriva  . HTN (hypertension)   . Hx of prostate biopsy    being watched carefully by Dr. Hartley Barefoot, pt. can't have MRI due to stent- per pt.    . Irregular heart beat   . Melanoma ([place]) 09/2010   pT2a N0 M0; clark level IV.   Marland Kitchen Myocardial infarction ([place]) 2008  . Neuromuscular disorder ([place])    numbness of L arm, lack of coordination, pt.told that its related to shoulder, post 4/16 RCT surgery    . Pain    back  . Range of motion deficit 09/19/2011  . Shortness of breath    with exeration  . Torn rotator cuff    hx of. left     Past Surgical History:  Procedure Laterality Date  . ABDOMINAL AORTIC ANEURYSM REPAIR    . ABDOMINAL AORTIC ENDOVASCULAR STENT GRAFT N/A 12/11/2012   Procedure: ABDOMINAL AORTIC ENDOVASCULAR STENT GRAFT GORE;  Surgeon: Serafina Mitchell, MD;  Location: [facility];  Service: Vascular;  Laterality: N/A;  . ABLATION Right 01/29/2017   RIGHT GROIN EXPLORATION, ABLATION OF RIGHT ILEO-INGUINAL NERVE (Neurectomy)  . APPLICATION OF WOUND VAC    .  ARTERIAL BYPASS SURGRY    . blood clots removed    . CORONARY ARTERY BYPASS GRAFT  07/2006  . EXCISION OF MESH Right 11/05/2017   Procedure: RIGHT GROIN EXPLORATION, REMOVAL OF MESH;  Surgeon: Coralie Keens, MD;  Location: [facility];  Service: General;  Laterality: Right;  . GROIN DISSECTION Right 01/29/2017   Procedure: RIGHT GROIN EXPLORATION, ABLATION OF RIGHT ILEO-INGUINAL NERVE;  Surgeon: Coralie Keens, MD;  Location: [facility];  Service: General;  Laterality: Right;  TAP BLOCK  . GROIN EXPLORATION Right 11/05/2017   RIGHT GROIN EXPLORATION, REMOVAL OF MESH  . INGUINAL HERNIA REPAIR Bilateral 04/18/2016   Procedure: LAPAROSCOPIC BILATERAL INGUINAL HERNIA REPAIR;  Surgeon: Coralie Keens, MD;  Location: [facility];  Service: General;  Laterality: Bilateral;  .  INSERTION OF MESH Bilateral 04/18/2016   Procedure: INSERTION OF MESH;  Surgeon: Coralie Keens, MD;  Location: [facility];  Service: General;  Laterality: Bilateral;  . MELANOMA EXCISION N/A    mid.- eventually needed wound vac for healing   . PROSTATE BIOPSY     being watched carefully by Dr. Hartley Barefoot, pt. can't have MRI due to stent- per pt.    Marland Kitchen SHOULDER ARTHROSCOPY WITH ROTATOR CUFF REPAIR AND SUBACROMIAL DECOMPRESSION Left 09/23/2014   Procedure: SHOULDER ARTHROSCOPY WITH ROTATOR CUFF REPAIR AND SUBACROMIAL DECOMPRESSION DISTAL CLAVICLE RESECTION;  Surgeon: Earlie Server, MD;  Location: [facility];  Service: Orthopedics;  Laterality: Left;  . TONSILLECTOMY  1951    Current Medications: Current Meds  Medication Sig  . amLODipine (NORVASC) 10 MG tablet Take 10 mg by mouth daily.   Marland Kitchen aspirin 81 MG chewable tablet Chew 81 mg by mouth at bedtime.   Marland Kitchen atorvastatin (LIPITOR) 40 MG tablet Take 40 mg by mouth at bedtime.   . benazepril (LOTENSIN) 40 MG tablet Take 40 mg by mouth daily.   . carvedilol (COREG) 25 MG tablet Take 1 tablet (25 mg total) by mouth 2 (two) times daily.  . cholecalciferol (VITAMIN D) 1000 UNITS tablet Take 1,000 Units by mouth daily.  . clopidogrel (PLAVIX) 75 MG tablet TAKE 1 TABLET BY MOUTH ONCE DAILY  . Coenzyme Q10 200 MG capsule Take 200 mg by mouth at bedtime.   . docusate sodium (COLACE) 100 MG capsule Take 100 mg by mouth 2 (two) times daily as needed for mild constipation.  . furosemide (LASIX) 20 MG tablet Take one tablet by mouth daily as needed for swelling/weight gain  . glipiZIDE (GLUCOTROL) 10 MG tablet Take 5 mg by mouth 2 (two) times daily.   . metFORMIN (GLUMETZA) 500 MG (MOD) 24 hr tablet Take 500 mg by mouth every evening.   . nortriptyline (PAMELOR) 50 MG capsule Take 50 mg by mouth at bedtime.  . Omega-3 Fatty Acids (FISH OIL TRIPLE STRENGTH) 1400 MG CAPS Take 1,400 mg by mouth every morning.  Marland Kitchen oxyCODONE (ROXICODONE) 5 MG immediate release tablet Take  1-2 tablets (5-10 mg total) by mouth every 4 (four) hours as needed for severe pain.  . potassium chloride SA (K-DUR,KLOR-CON) 20 MEQ tablet Take 20 mEq by mouth 2 (two) times daily.   Marland Kitchen tiotropium (SPIRIVA) 18 MCG inhalation capsule Place 18 mcg into inhaler and inhale daily.    . traZODone (DESYREL) 100 MG tablet Take 100 mg by mouth at bedtime.   . VOLTAREN 1 % GEL Apply topically 2 (two) times daily as needed. AS NEEDED TO AFFECTED AREA     Allergies:   Gabapentin; Zocor [simvastatin]; Lyrica [pregabalin]; Nsaids; and Tape  Social History   Socioeconomic History  . Marital status: Divorced    Spouse name: Not on file  . Number of children: Not on file  . Years of education: Not on file  . Highest education level: Not on file  Occupational History  . Occupation: retired  Scientific laboratory technician  . Financial resource strain: Not on file  . Food insecurity:    Worry: Not on file    Inability: Not on file  . Transportation needs:    Medical: Not on file    Non-medical: Not on file  Tobacco Use  . Smoking status: Never Smoker  . Smokeless tobacco: Never Used  Substance and Sexual Activity  . Alcohol use: No    Alcohol/week: 0.0 standard drinks  . Drug use: No  . Sexual activity: Not on file  Lifestyle  . Physical activity:    Days per week: Not on file    Minutes per session: Not on file  . Stress: Not on file  Relationships  . Social connections:    Talks on phone: Not on file    Gets together: Not on file    Attends religious service: Not on file    Active member of club or organization: Not on file    Attends meetings of clubs or organizations: Not on file    Relationship status: Not on file  Other Topics Concern  . Not on file  Social History Narrative  . Not on file     Family History: The patient's family history includes AAA (abdominal aortic aneurysm) in his father and mother; Heart disease in his father and mother; Hyperlipidemia in his father and mother;  Hypertension in his father and mother; Throat cancer in his maternal uncle.  ROS:   Please see the history of present illness.    ROS  All other systems reviewed and negative.   EKGs/Labs/Other Studies Reviewed:    The following studies were reviewed today: none  EKG:  EKG is not ordered today.    Recent Labs: 10/31/2017: Platelets 177 11/05/2017: BUN 16; Creatinine, Ser 1.10; Hemoglobin 12.6; Potassium 4.2; Sodium 141   Recent Lipid Panel    Component Value Date/Time   CHOL 109 03/18/2017 0822   TRIG 99 03/18/2017 0822   HDL 31 (L) 03/18/2017 0822   CHOLHDL 3.5 03/18/2017 0822   CHOLHDL 3.8 03/04/2016 1640   VLDL 29 03/04/2016 1640   LDLCALC 58 03/18/2017 0822    Physical Exam:    VS:  BP 126/68   Pulse 72   Ht 6' (1.829 m)   Wt 184 lb (83.5 kg)   SpO2 97%   BMI 24.95 kg/m     Wt Readings from Last 3 Encounters:  04/20/18 184 lb (83.5 kg)  01/01/18 184 lb (83.5 kg)  11/06/17 185 lb 3 oz (84 kg)     GEN:  Well nourished, well developed in no acute distress HEENT: Normal NECK: No JVD; No carotid bruits LYMPHATICS: No lymphadenopathy CARDIAC: RRR, no murmurs, rubs, gallops RESPIRATORY:  Clear to auscultation without rales, wheezing or rhonchi  ABDOMEN: Soft, non-tender, non-distended MUSCULOSKELETAL:  No edema; No deformity  SKIN: Warm and dry NEUROLOGIC:  Alert and oriented x 3 PSYCHIATRIC:  Normal affect   ASSESSMENT:    1. Coronary artery disease involving native coronary artery of native heart without angina pectoris   2. Essential hypertension   3. Bilateral carotid artery stenosis   4. Nonrheumatic aortic valve insufficiency   5. Chronic heart failure with  preserved ejection fraction ([place])   6. Hyperlipidemia LDL goal <70    PLAN:    In order of problems listed above:  1.  ASCAD - s/p remote CABG.  He has no anginal sx.  He will continue on ASA 81mg  daily, Plavix 75mg  daily, BB and statin.   2.  HTN - BP is controlled on exam today.  He will  continue on amlodipine 10mg  daily, Benazepril 40mg  daily, Carvedilol 25 BID.  I will check a BMET today.    3.  Bilateral carotid artery stenosis - dopplers 08/2017 showed bilateral 1-39% stenosis.  Continue on ASA and statin.  Repeat dopplers in 08/2018.  4.  Aortic insufficiency - this was mild by echo 2015.    5.  Chronic diastolic CHF - he appears euvolemic on exam and weight is stable.  He has chronic LE edema that is stable on PRN diuretics.   6.  Hyperlipidemia - LDL goal is < 70.  He will continue on atorvastatin 40mg  daily.  I will repeat an FLP and ALT.    Medication Adjustments/Labs and Tests Ordered: Current medicines are reviewed at length with the patient today.  Concerns regarding medicines are outlined above.  No orders of the defined types were placed in this encounter.  No orders of the defined types were placed in this encounter.   Signed, Fransico Him, MD  04/20/2018 8:18 AM    [place]

## 2018-04-20 NOTE — Patient Instructions (Signed)
Medication Instructions:  Your physician recommends that you continue on your current medications as directed. Please refer to the Current Medication list given to you today.  If you need a refill on your cardiac medications before your next appointment, please call your pharmacy.   Lab work: Today: BMET, LFT and Lipids  If you have labs (blood work) drawn today and your tests are completely normal, you will receive your results only by: Marland Kitchen MyChart Message (if you have MyChart) OR . A paper copy in the mail If you have any lab test that is abnormal or we need to change your treatment, we will call you to review the results.  Follow-Up: At [facility], you and your health needs are our priority.  As part of our continuing mission to provide you with exceptional heart care, we have created designated Provider Care Teams.  These Care Teams include your primary Cardiologist (physician) and Advanced Practice Providers (APPs -  Physician Assistants and Nurse Practitioners) who all work together to provide you with the care you need, when you need it. You will need a follow up appointment in 1 years.  Please call our office 2 months in advance to schedule this appointment.  You may see Dr. Radford Pax or one of the following Advanced Practice Providers on your designated Care Team:   [place], PA-C Melina Copa, PA-C . Ermalinda Barrios, PA-C

## 2018-04-22 ENCOUNTER — Ambulatory Visit (INDEPENDENT_AMBULATORY_CARE_PROVIDER_SITE_OTHER): Payer: Medicare Other | Admitting: Orthopaedic Surgery

## 2018-04-22 ENCOUNTER — Encounter (INDEPENDENT_AMBULATORY_CARE_PROVIDER_SITE_OTHER): Payer: Self-pay | Admitting: Orthopaedic Surgery

## 2018-04-22 ENCOUNTER — Other Ambulatory Visit (INDEPENDENT_AMBULATORY_CARE_PROVIDER_SITE_OTHER): Payer: Self-pay

## 2018-04-22 DIAGNOSIS — I6523 Occlusion and stenosis of bilateral carotid arteries: Secondary | ICD-10-CM

## 2018-04-22 DIAGNOSIS — M5431 Sciatica, right side: Secondary | ICD-10-CM | POA: Diagnosis not present

## 2018-04-22 DIAGNOSIS — R1031 Right lower quadrant pain: Secondary | ICD-10-CM

## 2018-04-22 DIAGNOSIS — M4807 Spinal stenosis, lumbosacral region: Secondary | ICD-10-CM

## 2018-04-22 NOTE — Progress Notes (Signed)
The patient is a 72 year old gentleman I seen before.  He was originally sent to need to evaluate right hip and groin pain.  He was sent by my brother is a general surgery who would perform hernia surgery on the patient.  He is also had prostate issues in the past.  On exam of his right hip it moves fluidly with no issues at all and his plain film and MRI showed no pathology of the right hip joint itself.  I then obtain an MRI of his lumbar spine and it did show right-sided findings with stenosis at several levels that I feel attributed to his symptoms.  I then sent him to Dr. Ernestina Patches for series of epidural steroid injections and at times he will get significant relief for 3 to 4 days but then would flareup quite a bit.  He is also been on chronic oxycodone but at times he only takes 1 a day and some days he will not take any.  This is been going on for 2 years.  He says the last injections did not help as much.  On exam his right hip exam and lower extremity exam continues to be normal other than pain.  I mild loss of what else I can provide for him.  I would like to send him at least 2 the spine specialist to see if they feel that his symptoms are related to his lumbar spine and help determine if any other intervention is needed.  We will work on making that referral.

## 2018-05-12 ENCOUNTER — Other Ambulatory Visit: Payer: Self-pay

## 2018-05-12 ENCOUNTER — Other Ambulatory Visit: Payer: Self-pay | Admitting: Neurological Surgery

## 2018-05-13 ENCOUNTER — Telehealth: Payer: Self-pay | Admitting: *Deleted

## 2018-05-13 NOTE — Telephone Encounter (Signed)
   [facility] Pre-operative Risk Assessment    Request for surgical clearance:  1. What type of surgery is being performed? L3-L4 LUMBAR LAMINECTOMY   2. When is this surgery scheduled? 07/06/18   3. What type of clearance is required (medical clearance vs. Pharmacy clearance to hold med vs. Both)? MEDICAL  4. Are there any medications that need to be held prior to surgery and how long? NOT MENTIONED ON FORM, BUT PT IS ON ASPIRIN   5. Practice name and name of physician performing surgery?  [facility] Ronnald Ramp   6. What is your office phone number [phone]    7.   What is your office fax number: [phone]  8.   Anesthesia type (None, local, MAC, general) ? GENERAL   Jeanann Lewandowsky 05/13/2018, 1:06 PM  _________________________________________________________________   (provider comments below)

## 2018-05-19 NOTE — Telephone Encounter (Signed)
   Primary Cardiologist: Nelva Bush, MD  Chart reviewed as part of pre-operative protocol coverage. Will route to Dr. Radford Pax for input on holding ASA and Plavix. Not mentioned on intake form but suspect they will want to consider holding for lumbar surgery. Dr. Radford Pax - Please route response to P CV DIV PREOP (the pre-op pool). Thank you.   Charlie Pitter, PA-C 05/19/2018, 5:03 PM

## 2018-05-19 NOTE — Telephone Encounter (Signed)
Ok to hold plavix and ASA for surgery

## 2018-05-21 NOTE — Telephone Encounter (Signed)
   Primary Cardiologist: Fransico Him, MD  Chart reviewed as part of pre-operative protocol coverage. Patient was contacted 05/21/2018 in reference to pre-operative risk assessment for pending surgery as outlined below.  Mitchell [name] was last seen on 04/20/18 by Dr. Radford Pax. H/o coronary artery disease status post CABG, AAA status post repair, hypertension, type 2 diabetes mellitus, leg edema, anemia, COPD, melanoma, mild carotid disease, mild AI 1157, chronic diastolic CHF. Doing well at recent [place]. I spoke with the patient today and he feels completely stable without any new angina or SOB. He is limited by back pain but is able to carry in groceries/go up steps (IE reach 4 METS) without any chest pain or limiting dyspnea. Therefore, based on ACC/AHA guidelines, the patient would be at acceptable risk for the planned procedure without further cardiovascular testing.   The clearance request did not specify on holding blood thinners. We typically advise to continue if there is no need to hold them, but if they need to be held for surgery, Dr. Radford Pax has cleared him to hold ASA and Plavix for the procedure. Would resume when felt safe by surgeon.  I will route this recommendation to the requesting party via Epic fax function and remove from pre-op pool.  Please call with questions.  Charlie Pitter, PA-C 05/21/2018, 3:15 PM

## 2018-05-22 NOTE — Telephone Encounter (Signed)
I s/s Dr. Radford Pax in regards to duration to hold ASA and Plavix. Per Dr. Radford Pax she is leaving this to the surgeon's recommendations. I then called the surgeon's office and advised per Dr. Radford Pax ok for surgeon to recommend duration for ASA and Plavix to be held. Nurse at Dr. Ronnald Ramp office states Dr. Ronnald Ramp is wanting to hold Plavix and ASA x 7 days. I advised pt to resume these medications as soon as the surgeon feels it is safe.

## 2018-05-25 ENCOUNTER — Encounter: Payer: Self-pay | Admitting: Surgery

## 2018-05-25 ENCOUNTER — Ambulatory Visit (INDEPENDENT_AMBULATORY_CARE_PROVIDER_SITE_OTHER): Payer: Medicare Other | Admitting: Surgery

## 2018-05-25 ENCOUNTER — Ambulatory Visit (HOSPITAL_COMMUNITY)
Admission: RE | Admit: 2018-05-25 | Discharge: 2018-05-25 | Disposition: A | Discharge status: Home / Self Care | Payer: Medicare Other | Source: Ambulatory Visit | Attending: Surgery | Admitting: Surgery

## 2018-05-25 VITALS — BP 131/70 | HR 63 | Temp 97.1°F | Resp 14 | Ht 72.0 in | Wt 189.0 lb

## 2018-05-25 DIAGNOSIS — I714 Abdominal aortic aneurysm, without rupture, unspecified: Secondary | ICD-10-CM

## 2018-05-25 DIAGNOSIS — I6523 Occlusion and stenosis of bilateral carotid arteries: Secondary | ICD-10-CM

## 2018-05-25 NOTE — Progress Notes (Signed)
[facility] [place]  Patient name: Mitchell [name] MRN: [id number] DOB: [birth year] Sex: male   REASON FOR VISIT:    Follow up  HISOTRY OF PRESENT ILLNESS:    Mitchell [name] is a 72 y.o. male who returns today for follow-up.  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  He has a known type II endoleak.  He is still having trouble with his hernia secondary to pain    PAST MEDICAL HISTORY:   Past Medical History:  Diagnosis Date  . AAA (abdominal aortic aneurysm) ([place])    ultrasound done in 3/11 and showed AAA 3.9 x 3.9cm  . Altered taste   . Anemia    3 units of packed cells given post surgery for melanoma .  Marland Kitchen Anxiety   . Aortic insufficiency   . Arthritis    arm,   . CAD (coronary artery disease)    patient had non-ST-elevation MI in February 2008 and there was an 80-90% proximal LAD stenosis, 90% proximal first diagonal stenosis, 99% ostial ramus stenosis. The first obtuse marginal was subtotally occluded and there was an 80% mid RCA stenosis. EF was estimated 25% on ventriculogram, coronary artery bypass grafting was done with a LIMA to the LAD, sequential saphenous vein graft to the PDA,  . Cancer ([place])    melanoma  . Carotid stenosis    Carotid US 3/17: bilat ICA 40-59% >> FU 1 year  . CHF (congestive heart failure) ([place]) 2008  . COPD (chronic obstructive pulmonary disease) ([place])   . Depression   . Diabetes mellitus   . DM2 (diabetes mellitus, type 2) ([place])   . History of PFTs    obstructive/restrictive. There is some reversibility with bronchodilator. The patient actually never smoked. He is on Spiriva  . HTN (hypertension)   . Hx of prostate biopsy    being watched carefully by Dr. Hartley Barefoot, pt. can't have MRI due to stent- per pt.    . Irregular heart beat   . Melanoma ([place]) 09/2010   pT2a N0 M0; clark level IV.   Marland Kitchen Myocardial infarction ([place]) 2008  . Neuromuscular  disorder ([place])    numbness of L arm, lack of coordination, pt.told that its related to shoulder, post 4/16 RCT surgery    . Pain    back  . Range of motion deficit 09/19/2011  . Shortness of breath    with exeration  . Torn rotator cuff    hx of. left      FAMILY HISTORY:   Family History  Problem Relation Age of Onset  . Hyperlipidemia Mother   . Hypertension Mother   . Heart disease Mother   . AAA (abdominal aortic aneurysm) Mother   . Hypertension Father   . AAA (abdominal aortic aneurysm) Father   . Heart disease Father   . Hyperlipidemia Father   . Throat cancer Maternal Uncle     SOCIAL HISTORY:   Social History   Tobacco Use  . Smoking status: Never Smoker  . Smokeless tobacco: Never Used  Substance Use Topics  . Alcohol use: No    Alcohol/week: 0.0 standard drinks     ALLERGIES:   Allergies  Allergen Reactions  . Gabapentin Other (See Comments)    Tremors  . Zocor [Simvastatin] Other (See Comments)    Bone Pain  . Lyrica [Pregabalin] Other (See Comments)    Dizziness   . Nsaids Other (See Comments)  Aneurism    . Tape Rash and Other (See Comments)    REDNESS     CURRENT MEDICATIONS:   Current Outpatient Medications  Medication Sig Dispense Refill  . amLODipine (NORVASC) 10 MG tablet Take 10 mg by mouth daily.     Marland Kitchen aspirin 81 MG chewable tablet Chew 81 mg by mouth at bedtime.     Marland Kitchen atorvastatin (LIPITOR) 40 MG tablet Take 40 mg by mouth at bedtime.     . benazepril (LOTENSIN) 40 MG tablet Take 40 mg by mouth daily.     . carvedilol (COREG) 25 MG tablet Take 1 tablet (25 mg total) by mouth 2 (two) times daily.    . cholecalciferol (VITAMIN D) 1000 UNITS tablet Take 1,000 Units by mouth daily.    . clopidogrel (PLAVIX) 75 MG tablet TAKE 1 TABLET BY MOUTH ONCE DAILY 90 tablet 3  . Coenzyme Q10 200 MG capsule Take 200 mg by mouth at bedtime.     . docusate sodium (COLACE) 100 MG capsule Take 100 mg by mouth 2 (two) times daily as needed for  mild constipation.    . furosemide (LASIX) 20 MG tablet Take one tablet by mouth daily as needed for swelling/weight gain 90 tablet 3  . glipiZIDE (GLUCOTROL) 10 MG tablet Take 5 mg by mouth 2 (two) times daily.     . metFORMIN (GLUMETZA) 500 MG (MOD) 24 hr tablet Take 500 mg by mouth every evening.     . nortriptyline (PAMELOR) 50 MG capsule Take 50 mg by mouth at bedtime.    . Omega-3 Fatty Acids (FISH OIL TRIPLE STRENGTH) 1400 MG CAPS Take 1,400 mg by mouth every morning.    Marland Kitchen oxyCODONE (ROXICODONE) 5 MG immediate release tablet Take 1-2 tablets (5-10 mg total) by mouth every 4 (four) hours as needed for severe pain. 40 tablet 0  . potassium chloride SA (K-DUR,KLOR-CON) 20 MEQ tablet Take 20 mEq by mouth 2 (two) times daily.     Marland Kitchen tiotropium (SPIRIVA) 18 MCG inhalation capsule Place 18 mcg into inhaler and inhale daily.      . traZODone (DESYREL) 100 MG tablet Take 100 mg by mouth at bedtime.     . VOLTAREN 1 % GEL Apply topically 2 (two) times daily as needed. AS NEEDED TO AFFECTED AREA     No current facility-administered medications for this visit.     REVIEW OF SYSTEMS:   [X]  denotes positive finding, [ ]  denotes negative finding Cardiac  Comments:  Chest pain or chest pressure:    Shortness of breath upon exertion:    Short of breath when lying flat:    Irregular heart rhythm:        Vascular    Pain in calf, thigh, or hip brought on by ambulation:    Pain in feet at night that wakes you up from your sleep:     Blood clot in your veins:    Leg swelling:         Pulmonary    Oxygen at home:    Productive cough:     Wheezing:         Neurologic    Sudden weakness in arms or legs:     Sudden numbness in arms or legs:     Sudden onset of difficulty speaking or slurred speech:    Temporary loss of vision in one eye:     Problems with dizziness:         Gastrointestinal  Blood in stool:     Vomited blood:         Genitourinary    Burning when urinating:     Blood  in urine:        Psychiatric    Major depression:         Hematologic    Bleeding problems:    Problems with blood clotting too easily:        Skin    Rashes or ulcers:        Constitutional    Fever or chills:      PHYSICAL EXAM:   There were no vitals filed for this visit.  GENERAL: The patient is a well-nourished male, in no acute distress. The vital signs are documented above. CARDIAC: There is a regular rate and rhythm.  PULMONARY: Non-labored respirations ABDOMEN: Soft and non-tender with normal pitched bowel sounds.  MUSCULOSKELETAL: There are no major deformities or cyanosis. NEUROLOGIC: No focal weakness or paresthesias are detected. SKIN: There are no ulcers or rashes noted. PSYCHIATRIC: The patient has a normal affect.  STUDIES:   I have reviewed his u/s with the following findings: +----------+----------------+-------------------+-------------------+      Diameter AP (cm)Diameter Trans (cm)Velocities (cm/sec) +----------+----------------+-------------------+-------------------+ Aorta   4.54      4.90        83          +----------+----------------+-------------------+-------------------+ Right Limb1.89      1.93        91          +----------+----------------+-------------------+-------------------+ Left Limb 1.85      1.69        97          +----------+----------------+-------------------+-------------------+  MEDICAL ISSUES:   AAA:  Stable size at 4.9 cm.  No endoleak seen today.  Follow up in 1 year with duplex    Annamarie Major, MD [facility] [phone] Pager [phone]

## 2018-07-01 ENCOUNTER — Inpatient Hospital Stay (HOSPITAL_COMMUNITY): Admission: RE | Admit: 2018-07-01 | Payer: Medicare Other | Source: Ambulatory Visit

## 2018-07-09 ENCOUNTER — Inpatient Hospital Stay: Admit: 2018-07-09 | Payer: Medicare Other | Admitting: Neurological Surgery

## 2018-07-09 SURGERY — LUMBAR LAMINECTOMY/DECOMPRESSION MICRODISCECTOMY 1 LEVEL
Anesthesia: General | Site: Back | Laterality: Bilateral

## 2018-07-18 DEATH — deceased
